# Patient Record
Sex: Female | Born: 1975
Health system: Southern US, Community
[De-identification: ages and names within clinical notes are randomized; demographics above are authoritative.]

## PROBLEM LIST (undated history)

## (undated) DIAGNOSIS — Z86718 Personal history of other venous thrombosis and embolism: Secondary | ICD-10-CM

## (undated) DIAGNOSIS — D509 Iron deficiency anemia, unspecified: Secondary | ICD-10-CM

## (undated) DIAGNOSIS — D219 Benign neoplasm of connective and other soft tissue, unspecified: Secondary | ICD-10-CM

## (undated) DIAGNOSIS — D649 Anemia, unspecified: Secondary | ICD-10-CM

## (undated) DIAGNOSIS — I82401 Acute embolism and thrombosis of unspecified deep veins of right lower extremity: Secondary | ICD-10-CM

## (undated) DIAGNOSIS — F32A Depression, unspecified: Secondary | ICD-10-CM

## (undated) DIAGNOSIS — F419 Anxiety disorder, unspecified: Secondary | ICD-10-CM

## (undated) DIAGNOSIS — I10 Essential (primary) hypertension: Secondary | ICD-10-CM

## (undated) DIAGNOSIS — F329 Major depressive disorder, single episode, unspecified: Secondary | ICD-10-CM

## (undated) HISTORY — DX: Personal history of other venous thrombosis and embolism: Z86.718

## (undated) HISTORY — PX: LAPAROSCOPIC TOTAL HYSTERECTOMY: SUR800

## (undated) HISTORY — DX: Anxiety disorder, unspecified: F41.9

## (undated) HISTORY — DX: Benign neoplasm of connective and other soft tissue, unspecified: D21.9

## (undated) HISTORY — DX: Essential (primary) hypertension: I10

## (undated) HISTORY — PX: GASTRIC BYPASS: SHX52

## (undated) HISTORY — DX: Anemia, unspecified: D64.9

## (undated) HISTORY — DX: Depression, unspecified: F32.A

## (undated) HISTORY — DX: Acute embolism and thrombosis of unspecified deep veins of right lower extremity: I82.401

## (undated) HISTORY — DX: Major depressive disorder, single episode, unspecified: F32.9

## (undated) HISTORY — DX: Iron deficiency anemia, unspecified: D50.9

---

## 2016-11-13 ENCOUNTER — Encounter: Payer: Self-pay | Admitting: Internal Medicine

## 2017-09-17 ENCOUNTER — Encounter: Payer: Self-pay | Admitting: Internal Medicine

## 2017-10-11 LAB — HM PAP SMEAR: HM PAP: NORMAL

## 2018-01-21 ENCOUNTER — Encounter: Payer: Self-pay | Admitting: Internal Medicine

## 2018-12-20 ENCOUNTER — Ambulatory Visit: Payer: BLUE CROSS/BLUE SHIELD | Admitting: Internal Medicine

## 2018-12-20 ENCOUNTER — Encounter: Payer: Self-pay | Admitting: Internal Medicine

## 2018-12-20 VITALS — BP 128/86 | HR 73 | Temp 98.6°F | Ht 70.0 in | Wt 368.8 lb

## 2018-12-20 DIAGNOSIS — Z9884 Bariatric surgery status: Secondary | ICD-10-CM | POA: Insufficient documentation

## 2018-12-20 DIAGNOSIS — F5104 Psychophysiologic insomnia: Secondary | ICD-10-CM | POA: Insufficient documentation

## 2018-12-20 DIAGNOSIS — F419 Anxiety disorder, unspecified: Secondary | ICD-10-CM | POA: Insufficient documentation

## 2018-12-20 DIAGNOSIS — Z1159 Encounter for screening for other viral diseases: Secondary | ICD-10-CM

## 2018-12-20 DIAGNOSIS — Z0184 Encounter for antibody response examination: Secondary | ICD-10-CM

## 2018-12-20 DIAGNOSIS — E538 Deficiency of other specified B group vitamins: Secondary | ICD-10-CM

## 2018-12-20 DIAGNOSIS — D509 Iron deficiency anemia, unspecified: Secondary | ICD-10-CM

## 2018-12-20 DIAGNOSIS — Z8679 Personal history of other diseases of the circulatory system: Secondary | ICD-10-CM | POA: Diagnosis not present

## 2018-12-20 DIAGNOSIS — Z6841 Body Mass Index (BMI) 40.0 and over, adult: Secondary | ICD-10-CM | POA: Insufficient documentation

## 2018-12-20 DIAGNOSIS — E1159 Type 2 diabetes mellitus with other circulatory complications: Secondary | ICD-10-CM | POA: Insufficient documentation

## 2018-12-20 DIAGNOSIS — G4700 Insomnia, unspecified: Secondary | ICD-10-CM

## 2018-12-20 DIAGNOSIS — R739 Hyperglycemia, unspecified: Secondary | ICD-10-CM

## 2018-12-20 DIAGNOSIS — I10 Essential (primary) hypertension: Secondary | ICD-10-CM | POA: Insufficient documentation

## 2018-12-20 DIAGNOSIS — Z1329 Encounter for screening for other suspected endocrine disorder: Secondary | ICD-10-CM

## 2018-12-20 DIAGNOSIS — Z1389 Encounter for screening for other disorder: Secondary | ICD-10-CM

## 2018-12-20 DIAGNOSIS — Z13220 Encounter for screening for lipoid disorders: Secondary | ICD-10-CM

## 2018-12-20 DIAGNOSIS — E559 Vitamin D deficiency, unspecified: Secondary | ICD-10-CM

## 2018-12-20 DIAGNOSIS — Z86718 Personal history of other venous thrombosis and embolism: Secondary | ICD-10-CM

## 2018-12-20 DIAGNOSIS — F329 Major depressive disorder, single episode, unspecified: Secondary | ICD-10-CM

## 2018-12-20 MED ORDER — SERTRALINE HCL 25 MG PO TABS: 25.0000 mg | ORAL_TABLET | Freq: Every day | ORAL | 0 refills | Status: DC

## 2018-12-20 NOTE — Patient Instructions (Addendum)
You can try Melatonin or L thenanine at night for sleep  Or warm milk or chamomile tea   Iron Deficiency Anemia, Adult Iron deficiency anemia is a condition in which the concentration of red blood cells or hemoglobin in the blood is below normal because of too little iron. Hemoglobin is a substance in red blood cells that carries oxygen to the body's tissues. When the concentration of red blood cells or hemoglobin is too low, not enough oxygen reaches these tissues. Iron deficiency anemia is usually long-lasting (chronic) and it develops over time. It may or may not cause symptoms. It is a common type of anemia. What are the causes? This condition may be caused by:  Not enough iron in the diet.  Blood loss caused by bleeding in the intestine.  Blood loss from a gastrointestinal condition like Crohn disease.  Frequent blood draws, such as from blood donation.  Abnormal absorption in the gut.  Heavy menstrual periods in women.  Cancers of the gastrointestinal system, such as colon cancer. What are the signs or symptoms? Symptoms of this condition may include:  Fatigue.  Headache.  Pale skin, lips, and nail beds.  Poor appetite.  Weakness.  Shortness of breath.  Dizziness.  Cold hands and feet.  Fast or irregular heartbeat.  Irritability. This is more common in severe anemia.  Rapid breathing. This is more common in severe anemia. Mild anemia may not cause any symptoms. How is this diagnosed? This condition is diagnosed based on:  Your medical history.  A physical exam.  Blood tests. You may have additional tests to find the underlying cause of your anemia, such as:  Testing for blood in the stool (fecal occult blood test).  A procedure to see inside your colon and rectum (colonoscopy).  A procedure to see inside your esophagus and stomach (endoscopy).  A test in which cells are removed from bone marrow (bone marrow aspiration) or fluid is removed from the  bone marrow to be examined (biopsy). This is rarely needed. How is this treated? This condition is treated by correcting the cause of your iron deficiency. Treatment may involve:  Adding iron-rich foods to your diet.  Taking iron supplements. If you are pregnant or breastfeeding, you may need to take extra iron because your normal diet usually does not provide the amount of iron that you need.  Increasing vitamin C intake. Vitamin C helps your body absorb iron. Your health care provider may recommend that you take iron supplements along with a glass of orange juice or a vitamin C supplement.  Medicines to make heavy menstrual flow lighter.  Surgery. You may need repeat blood tests to determine whether treatment is working. Depending on the underlying cause, the anemia should be corrected within 2 months of starting treatment. If the treatment does not seem to be working, you may need more testing. Follow these instructions at home: Medicines  Take over-the-counter and prescription medicines only as told by your health care provider. This includes iron supplements and vitamins.  If you cannot tolerate taking iron supplements by mouth, talk with your health care provider about taking them through a vein (intravenously) or an injection into a muscle.  For the best iron absorption, you should take iron supplements when your stomach is empty. If you cannot tolerate them on an empty stomach, you may need to take them with food.  Do not drink milk or take antacids at the same time as your iron supplements. Milk and antacids may  interfere with iron absorption.  Iron supplements can cause constipation. To prevent constipation, include fiber in your diet as told by your health care provider. A stool softener may also be recommended. Eating and drinking   Talk with your health care provider before changing your diet. He or she may recommend that you eat foods that contain a lot of iron, such  as: ? Liver. ? Low-fat (lean) beef. ? Breads and cereals that have iron added to them (are fortified). ? Eggs. ? Dried fruit. ? Dark green, leafy vegetables.  To help your body use the iron from iron-rich foods, eat those foods at the same time as fresh fruits and vegetables that are high in vitamin C. Foods that are high in vitamin C include: ? Oranges. ? Peppers. ? Tomatoes. ? Mangoes.  Drinkenoughfluid to keep your urine clear or pale yellow. General instructions  Return to your normal activities as told by your health care provider. Ask your health care provider what activities are safe for you.  Practice good hygiene. Anemia can make you more prone to illness and infection.  Keep all follow-up visits as told by your health care provider. This is important. Contact a health care provider if:  You feel nauseous or you vomit.  You feel weak.  You have unexplained sweating.  You develop symptoms of constipation, such as: ? Having fewer than three bowel movements a week. ? Straining to have a bowel movement. ? Having stools that are hard, dry, or larger than normal. ? Feeling full or bloated. ? Pain in the lower abdomen. ? Not feeling relief after having a bowel movement. Get help right away if:  You faint. If this happens, do not drive yourself to the hospital. Call your local emergency services (911 in the [place]  You have chest pain.  You have shortness of breath that: ? Is severe. ? Gets worse with physical activity.  You have a rapid heartbeat.  You become light-headed when getting up from a sitting or lying down position. This information is not intended to replace advice given to you by your health care provider. Make sure you discuss any questions you have with your health care provider. Document Released: 10/30/2000 Document Revised: 07/22/2016 Document Reviewed: 07/22/2016 Elsevier Interactive Patient Education  2019 [employer].  Exercising to [employer] Exercise is structured, repetitive physical activity to improve fitness and health. Getting regular exercise is important for everyone. It is especially important if you are overweight. Being overweight increases your risk of heart disease, stroke, diabetes, high blood pressure, and several types of cancer. Reducing your calorie intake and exercising can help you lose weight. Exercise is usually categorized as moderate or vigorous intensity. To lose weight, most people need to do a certain amount of moderate-intensity or vigorous-intensity exercise each week. Moderate-intensity exercise  Moderate-intensity exercise is any activity that gets you moving enough to burn at least three times more energy (calories) than if you were sitting. Examples of moderate exercise include:  Walking a mile in 15 minutes.  Doing light yard work.  Biking at an easy pace. Most people should get at least 150 minutes (2 hours and 30 minutes) a week of moderate-intensity exercise to maintain their body weight. Vigorous-intensity exercise Vigorous-intensity exercise is any activity that gets you moving enough to burn at least six times more calories than if you were sitting. When you exercise at this intensity, you should be working hard enough that you are not able to carry on  a conversation. Examples of vigorous exercise include:  Running.  Playing a team sport, such as football, basketball, and soccer.  Jumping rope. Most people should get at least 75 minutes (1 hour and 15 minutes) a week of vigorous-intensity exercise to maintain their body weight. How can exercise affect me? When you exercise enough to burn more calories than you eat, you lose weight. Exercise also reduces body fat and builds muscle. The more muscle you have, the more calories you burn. Exercise also:  Improves mood.  Reduces stress and tension.  Improves your overall fitness, flexibility, and endurance.  Increases bone  strength. The amount of exercise you need to lose weight depends on:  Your age.  The type of exercise.  Any health conditions you have.  Your overall physical ability. Talk to your health care provider about how much exercise you need and what types of activities are safe for you. What actions can I take to lose weight? Nutrition   Make changes to your diet as told by your health care provider or diet and nutrition specialist (dietitian). This may include: ? Eating fewer calories. ? Eating more protein. ? Eating less unhealthy fats. ? Eating a diet that includes fresh fruits and vegetables, whole grains, low-fat dairy products, and lean protein. ? Avoiding foods with added fat, salt, and sugar.  Drink plenty of water while you exercise to prevent dehydration or heat stroke. Activity  Choose an activity that you enjoy and set realistic goals. Your health care provider can help you make an exercise plan that works for you.  Exercise at a moderate or vigorous intensity most days of the week. ? The intensity of exercise may vary from person to person. You can tell how intense a workout is for you by paying attention to your breathing and heartbeat. Most people will notice their breathing and heartbeat get faster with more intense exercise.  Do resistance training twice each week, such as: ? Push-ups. ? Sit-ups. ? Lifting weights. ? Using resistance bands.  Getting short amounts of exercise can be just as helpful as long structured periods of exercise. If you have trouble finding time to exercise, try to include exercise in your daily routine. ? Get up, stretch, and walk around every 30 minutes throughout the day. ? Go for a walk during your lunch break. ? Park your car farther away from your destination. ? If you take public transportation, get off one stop early and walk the rest of the way. ? Make phone calls while standing up and walking around. ? Take the stairs instead of  elevators or escalators.  Wear comfortable clothes and shoes with good support.  Do not exercise so much that you hurt yourself, feel dizzy, or get very short of breath. Where to find more information  [place] Department of Health and Human Services: [employer]at  Centers for Disease Control and Prevention (CDC): http://www.wolf.info/ Contact a health care provider:  Before starting a new exercise program.  If you have questions or concerns about your weight.  If you have a medical problem that keeps you from exercising. Get help right away if you have any of the following while exercising:  Injury.  Dizziness.  Difficulty breathing or shortness of breath that does not go away when you stop exercising.  Chest pain.  Rapid heartbeat. Summary  Being overweight increases your risk of heart disease, stroke, diabetes, high blood pressure, and several types of cancer.  Losing weight happens when you burn more calories  than you eat.  Reducing the amount of calories you eat in addition to getting regular moderate or vigorous exercise each week helps you lose weight. This information is not intended to replace advice given to you by your health care provider. Make sure you discuss any questions you have with your health care provider. Document Released: 12/05/2010 Document Revised: 11/15/2017 Document Reviewed: 11/15/2017 Elsevier Interactive Patient Education  2019 [employer].

## 2018-12-20 NOTE — Progress Notes (Addendum)
Chief Complaint  Patient presents with  . Establish Care   New Patient  1. H/o HTN BP controlled off meds  2. Iron def anemia s/p gastric bypass in 2007 lost from 500 lbs to 368 lbs  3. Anxiety/depression/insomnia sleeping 3-4 hrs GAD 7 score 12 and PHQ 9 score 11. Declines therapy for now  4. H/o right leg dvt no active sx's tx'ed eliquis x 3 months but stopped caused n/v  5. Morbid obesity s/p gastric bypass was 500 + lbs prior to surgery. She wants to get on adipex again   Review of Systems  Constitutional: Negative for weight loss.  HENT: Negative for hearing loss.   Eyes: Negative for blurred vision.  Respiratory: Negative for shortness of breath.   Cardiovascular: Negative for chest pain.  Gastrointestinal: Negative for abdominal pain.  Musculoskeletal: Negative for falls.  Skin: Negative for rash.  Neurological: Negative for headaches.  Psychiatric/Behavioral: Positive for depression. The patient is nervous/anxious and has insomnia.    Past Medical History:  Diagnosis Date  . Anemia   . Anxiety   . Depression   . Hypertension   . Iron deficiency anemia   . Right leg DVT [facility])    Past Surgical History:  Procedure Laterality Date  . GASTRIC BYPASS     2007   Family History  Problem Relation Age of Onset  . Hypertension Mother   . Hyperlipidemia Father   . Diabetes Maternal Grandmother    Social History   Socioeconomic History  . Marital status: Married    Spouse name: Not on file  . Number of children: Not on file  . Years of education: Not on file  . Highest education level: Not on file  Occupational History  . Not on file  Social Needs  . Financial resource strain: Not on file  . Food insecurity:    Worry: Not on file    Inability: Not on file  . Transportation needs:    Medical: Not on file    Non-medical: Not on file  Tobacco Use  . Smoking status: Never Smoker  . Smokeless tobacco: Never Used  Substance and Sexual Activity  . Alcohol use:  Not Currently  . Drug use: Not Currently  . Sexual activity: Yes    Comment: men  Lifestyle  . Physical activity:    Days per week: Not on file    Minutes per session: Not on file  . Stress: Not on file  Relationships  . Social connections:    Talks on phone: Not on file    Gets together: Not on file    Attends religious service: Not on file    Active member of club or organization: Not on file    Attends meetings of clubs or organizations: Not on file    Relationship status: Not on file  . Intimate partner violence:    Fear of current or ex partner: Not on file    Emotionally abused: Not on file    Physically abused: Not on file    Forced sexual activity: Not on file  Other Topics Concern  . Not on file  Social History Narrative   Married Cytogeneticist    2 kids 34 and 39 y.o as of 12/20/2018    Moved from [place] to [place] to [place]    Owns guns, wears seat belt, safe in relationship    3 pregnancies 2 live births    No outpatient medications have been marked as taking for  the 12/20/18 encounter (Office Visit) with McLean-Scocuzza, Nino Glow, MD.   No Known Allergies No results found for this or any previous visit (from the past 2160 hour(s)). Objective  Body mass index is 52.92 kg/m. Wt Readings from Last 3 Encounters:  12/20/18 (!) 368 lb 12.8 oz (167.3 kg)   Temp Readings from Last 3 Encounters:  12/20/18 98.6 F (37 C) (Oral)   BP Readings from Last 3 Encounters:  12/20/18 128/86   Pulse Readings from Last 3 Encounters:  12/20/18 73    Physical Exam Vitals signs and nursing note reviewed.  Constitutional:      Appearance: Normal appearance. She is well-developed and well-groomed. She is morbidly obese.  HENT:     Head: Normocephalic and atraumatic.     Nose: Nose normal.     Mouth/Throat:     Mouth: Mucous membranes are moist.     Pharynx: Oropharynx is clear.  Eyes:     Conjunctiva/sclera: Conjunctivae normal.     Pupils: Pupils are equal, round, and reactive to  light.  Cardiovascular:     Rate and Rhythm: Normal rate and regular rhythm.     Pulses: Normal pulses.     Heart sounds: Normal heart sounds.  Pulmonary:     Effort: Pulmonary effort is normal.     Breath sounds: Normal breath sounds.  Skin:    General: Skin is warm and dry.  Neurological:     General: No focal deficit present.     Mental Status: She is alert and oriented to person, place, and time. Mental status is at baseline.     Gait: Gait normal.  Psychiatric:        Attention and Perception: Attention and perception normal.        Mood and Affect: Mood and affect normal.        Speech: Speech normal.        Behavior: Behavior normal. Behavior is cooperative.        Thought Content: Thought content normal.        Cognition and Memory: Cognition and memory normal.        Judgment: Judgment normal.     Assessment   1. H/o HTN BP controlled 2. Iron def anemia s/p gastric bypass in 2007  3. Anxiety/depression/insomnia sleeping 3-4 hrs GAD 7 score 12 and PHQ 9 score 11  4. H/o right leg dvt no active sx's 5. Morbid obesity s/p gastric bypass was 500 + lbs prior to surgery  6. HM Plan   1.  Monitor BP  2.  Check labs may need h/o referral  3.  zoloft 25 mg qd  Consider melatonin or l theanine  Declines therapy for now  4.  tx'ed eliquis x 3 months year 2019 could not tolerate due to n/v  5.  Consider adipex pending labs if not significantly anemic  6.  Declines flu shot of not she had flu 2 weeks ago entire family. Last flu shot in 2018  Tdap thinks had in 2018  LMP 12/09/2018, Pap 2018 OB/GYN in [place] get records  Pap negative 10/11/17 neg pap neg HPV  Former PCP Dr. Kathe Mariner [facility]  Get records [facility] and [place] obained -obtained records [place] seen 01/17/18 DVT right lower ext 11/13/2016 s/p Eliquis x 2 months and iron def anemia hbg 6.4, 7.6  -h/o low Hbg A2 1.6 range 1.8-3.2 may indicate alpha thal or iron  def anemia  H/o  elevated lfts AST 42 range was 14-36  Provider: Dr. Olivia Mackie McLean-Scocuzza-Internal Medicine

## 2018-12-20 NOTE — Progress Notes (Signed)
Pre visit review using our clinic review tool, if applicable. No additional management support is needed unless otherwise documented below in the visit note. 

## 2018-12-23 ENCOUNTER — Other Ambulatory Visit (INDEPENDENT_AMBULATORY_CARE_PROVIDER_SITE_OTHER): Payer: BLUE CROSS/BLUE SHIELD

## 2018-12-23 ENCOUNTER — Encounter: Payer: Self-pay | Admitting: Internal Medicine

## 2018-12-23 ENCOUNTER — Telehealth: Payer: Self-pay | Admitting: Radiology

## 2018-12-23 ENCOUNTER — Other Ambulatory Visit: Payer: Self-pay | Admitting: Internal Medicine

## 2018-12-23 DIAGNOSIS — E559 Vitamin D deficiency, unspecified: Secondary | ICD-10-CM

## 2018-12-23 DIAGNOSIS — Z0184 Encounter for antibody response examination: Secondary | ICD-10-CM

## 2018-12-23 DIAGNOSIS — Z1159 Encounter for screening for other viral diseases: Secondary | ICD-10-CM | POA: Diagnosis not present

## 2018-12-23 DIAGNOSIS — E538 Deficiency of other specified B group vitamins: Secondary | ICD-10-CM | POA: Diagnosis not present

## 2018-12-23 DIAGNOSIS — Z13220 Encounter for screening for lipoid disorders: Secondary | ICD-10-CM | POA: Diagnosis not present

## 2018-12-23 DIAGNOSIS — Z1389 Encounter for screening for other disorder: Secondary | ICD-10-CM | POA: Diagnosis not present

## 2018-12-23 DIAGNOSIS — F419 Anxiety disorder, unspecified: Secondary | ICD-10-CM | POA: Diagnosis not present

## 2018-12-23 DIAGNOSIS — R739 Hyperglycemia, unspecified: Secondary | ICD-10-CM | POA: Diagnosis not present

## 2018-12-23 DIAGNOSIS — F329 Major depressive disorder, single episode, unspecified: Secondary | ICD-10-CM | POA: Diagnosis not present

## 2018-12-23 DIAGNOSIS — Z1329 Encounter for screening for other suspected endocrine disorder: Secondary | ICD-10-CM

## 2018-12-23 DIAGNOSIS — D509 Iron deficiency anemia, unspecified: Secondary | ICD-10-CM

## 2018-12-23 DIAGNOSIS — Z8679 Personal history of other diseases of the circulatory system: Secondary | ICD-10-CM

## 2018-12-23 LAB — LIPID PANEL
CHOLESTEROL: 169 mg/dL (ref 0–200)
HDL: 45 mg/dL (ref >39.00)
LDL CALC: 105 mg/dL — AB (ref 0–99)
NonHDL: 124.26
Total CHOL/HDL Ratio: 4
Triglycerides: 98 mg/dL (ref 0.0–149.0)
VLDL: 19.6 mg/dL (ref 0.0–40.0)

## 2018-12-23 LAB — COMPREHENSIVE METABOLIC PANEL
ALT: 8 U/L (ref 0–35)
AST: 19 U/L (ref 0–37)
Albumin: 3.9 g/dL (ref 3.5–5.2)
Alkaline Phosphatase: 135 U/L — ABNORMAL HIGH (ref 39–117)
BUN: 9 mg/dL (ref 6–23)
CO2: 26 mEq/L (ref 19–32)
CREATININE: 0.77 mg/dL (ref 0.40–1.20)
Calcium: 7.8 mg/dL — ABNORMAL LOW (ref 8.4–10.5)
Chloride: 103 mEq/L (ref 96–112)
GFR: 82.67 mL/min (ref >60.00)
Glucose, Bld: 75 mg/dL (ref 70–99)
Potassium: 3.7 mEq/L (ref 3.5–5.1)
Sodium: 137 mEq/L (ref 135–145)
Total Bilirubin: 0.4 mg/dL (ref 0.2–1.2)
Total Protein: 7.3 g/dL (ref 6.0–8.3)

## 2018-12-23 LAB — CBC WITH DIFFERENTIAL/PLATELET
Basophils Absolute: 0 10*3/uL (ref 0.0–0.1)
Basophils Relative: 0.2 % (ref 0.0–3.0)
Eosinophils Absolute: 0 10*3/uL (ref 0.0–0.7)
Eosinophils Relative: 0.6 % (ref 0.0–5.0)
HCT: 26.8 % — ABNORMAL LOW (ref 36.0–46.0)
Hemoglobin: 8 g/dL — CL (ref 12.0–15.0)
Lymphocytes Relative: 33.8 % (ref 12.0–46.0)
Lymphs Abs: 2.3 10*3/uL (ref 0.7–4.0)
MCHC: 29.8 g/dL — ABNORMAL LOW (ref 30.0–36.0)
MCV: 63.1 fl — ABNORMAL LOW (ref 78.0–100.0)
Monocytes Absolute: 0.5 10*3/uL (ref 0.1–1.0)
Monocytes Relative: 7.3 % (ref 3.0–12.0)
Neutro Abs: 3.9 10*3/uL (ref 1.4–7.7)
Neutrophils Relative %: 58.1 % (ref 43.0–77.0)
Platelets: 442 10*3/uL — ABNORMAL HIGH (ref 150.0–400.0)
RBC: 4.25 Mil/uL (ref 3.87–5.11)
RDW: 21.1 % — ABNORMAL HIGH (ref 11.5–15.5)
WBC: 6.8 10*3/uL (ref 4.0–10.5)

## 2018-12-23 LAB — URINALYSIS, ROUTINE W REFLEX MICROSCOPIC
BILIRUBIN URINE: NEGATIVE
Glucose, UA: NEGATIVE
Hgb urine dipstick: NEGATIVE
Hyaline Cast: NONE SEEN /LPF
Ketones, ur: NEGATIVE
NITRITE: NEGATIVE
Protein, ur: NEGATIVE
RBC / HPF: NONE SEEN /HPF (ref 0–2)
Specific Gravity, Urine: 1.009 (ref 1.001–1.03)
pH: 7 (ref 5.0–8.0)

## 2018-12-23 LAB — VITAMIN D 25 HYDROXY (VIT D DEFICIENCY, FRACTURES): VITD: 10.71 ng/mL — ABNORMAL LOW (ref 30.00–100.00)

## 2018-12-23 LAB — VITAMIN B12: VITAMIN B 12: 208 pg/mL — AB (ref 211–911)

## 2018-12-23 LAB — TSH: TSH: 2.75 u[IU]/mL (ref 0.35–4.50)

## 2018-12-23 LAB — T4, FREE: Free T4: 0.74 ng/dL (ref 0.60–1.60)

## 2018-12-23 LAB — HEMOGLOBIN A1C: Hgb A1c MFr Bld: 5.5 % (ref 4.6–6.5)

## 2018-12-23 MED ORDER — CHOLECALCIFEROL 1.25 MG (50000 UT) PO CAPS: 50000.0000 [IU] | ORAL_CAPSULE | ORAL | 1 refills | Status: DC

## 2018-12-23 NOTE — Telephone Encounter (Signed)
[facility] called with critical hemoglobin of 8.0. Read back and verified.

## 2018-12-26 ENCOUNTER — Other Ambulatory Visit: Payer: Self-pay | Admitting: Internal Medicine

## 2018-12-26 DIAGNOSIS — Z86718 Personal history of other venous thrombosis and embolism: Secondary | ICD-10-CM

## 2018-12-26 DIAGNOSIS — D509 Iron deficiency anemia, unspecified: Secondary | ICD-10-CM

## 2018-12-26 LAB — MEASLES/MUMPS/RUBELLA IMMUNITY
Mumps IgG: 16.6 AU/mL
Rubella: 4.28 index
Rubeola IgG: 219 AU/mL

## 2018-12-26 LAB — IRON,TIBC AND FERRITIN PANEL
%SAT: 5 % (calc) — ABNORMAL LOW (ref 16–45)
Ferritin: 6 ng/mL — ABNORMAL LOW (ref 16–154)
Iron: 21 ug/dL — ABNORMAL LOW (ref 40–190)
TIBC: 412 mcg/dL (calc) (ref 250–450)

## 2018-12-26 LAB — HEPATITIS B SURFACE ANTIBODY, QUANTITATIVE: Hep B S AB Quant (Post): <5 m[IU]/mL — ABNORMAL LOW (ref >=10)

## 2018-12-30 ENCOUNTER — Inpatient Hospital Stay: Payer: BLUE CROSS/BLUE SHIELD | Attending: Internal Medicine | Admitting: Internal Medicine

## 2018-12-30 ENCOUNTER — Telehealth: Payer: Self-pay | Admitting: Internal Medicine

## 2018-12-30 ENCOUNTER — Encounter: Payer: Self-pay | Admitting: Internal Medicine

## 2018-12-30 DIAGNOSIS — E538 Deficiency of other specified B group vitamins: Secondary | ICD-10-CM | POA: Diagnosis not present

## 2018-12-30 DIAGNOSIS — E559 Vitamin D deficiency, unspecified: Secondary | ICD-10-CM | POA: Diagnosis not present

## 2018-12-30 DIAGNOSIS — G2581 Restless legs syndrome: Secondary | ICD-10-CM | POA: Diagnosis not present

## 2018-12-30 DIAGNOSIS — Z86718 Personal history of other venous thrombosis and embolism: Secondary | ICD-10-CM | POA: Diagnosis not present

## 2018-12-30 DIAGNOSIS — D508 Other iron deficiency anemias: Secondary | ICD-10-CM | POA: Diagnosis not present

## 2018-12-30 DIAGNOSIS — G4700 Insomnia, unspecified: Secondary | ICD-10-CM | POA: Insufficient documentation

## 2018-12-30 DIAGNOSIS — E669 Obesity, unspecified: Secondary | ICD-10-CM | POA: Insufficient documentation

## 2018-12-30 DIAGNOSIS — D509 Iron deficiency anemia, unspecified: Secondary | ICD-10-CM

## 2018-12-30 DIAGNOSIS — I10 Essential (primary) hypertension: Secondary | ICD-10-CM | POA: Insufficient documentation

## 2018-12-30 DIAGNOSIS — R5383 Other fatigue: Secondary | ICD-10-CM | POA: Diagnosis not present

## 2018-12-30 DIAGNOSIS — Z79899 Other long term (current) drug therapy: Secondary | ICD-10-CM | POA: Diagnosis not present

## 2018-12-30 DIAGNOSIS — K909 Intestinal malabsorption, unspecified: Secondary | ICD-10-CM | POA: Diagnosis not present

## 2018-12-30 DIAGNOSIS — Z9884 Bariatric surgery status: Secondary | ICD-10-CM | POA: Insufficient documentation

## 2018-12-30 NOTE — Progress Notes (Signed)
[place] NOTE  Patient Care Team: McLean-Scocuzza, Nino Glow, MD as PCP - General (Internal Medicine)  CHIEF COMPLAINTS/PURPOSE OF CONSULTATION:    HEMATOLOGY HISTORY  # IRON DEF ANEMIA- sec to gastric bypass [pcp- ferritin6/Hb-8 MCV 68; B12 -low; vit D-low]  # gastric bypass [2007; [place]]   HISTORY OF PRESENTING ILLNESS:  Alyssa Cain 43 y.o.  female has been referred to [name] for further evaluation/work-up for anemia.  Patient states that she had a gastric bypass in 2007 for obesity.  However more recently in [place] noted to have worsening anemia.  Patient was told to get IV iron infusions however she had more recently up to [facility].  She has not had any IV iron infusions.  She was recently evaluated by PCP/establish care and noted to have severe iron deficiency and B12 deficiency vitamin D deficiency.  No blood in stools black or stools.  No heavy vaginal bleeding.  No abnormal weight loss or difficulty swallowing.  Complains of significant difficulty sleeping and restless legs.  No pica.  Fatigue on exertion.   Review of Systems  Constitutional: Positive for malaise/fatigue. Negative for chills, diaphoresis, fever and weight loss.  HENT: Negative for nosebleeds and sore throat.   Eyes: Negative for double vision.  Respiratory: Negative for cough, hemoptysis, sputum production, shortness of breath and wheezing.   Cardiovascular: Negative for chest pain, palpitations, orthopnea and leg swelling.  Gastrointestinal: Negative for abdominal pain, blood in stool, constipation, diarrhea, heartburn, melena, nausea and vomiting.  Genitourinary: Negative for dysuria, frequency and urgency.  Musculoskeletal: Positive for myalgias. Negative for back pain and joint pain.  Skin: Negative.  Negative for itching and rash.  Neurological: Positive for tingling. Negative for dizziness, focal weakness, weakness and headaches.  Endo/Heme/Allergies: Does  not bruise/bleed easily.  Psychiatric/Behavioral: Negative for depression. The patient has insomnia. The patient is not nervous/anxious.     MEDICAL HISTORY:  Past Medical History:  Diagnosis Date  . Anemia   . Anxiety   . Depression   . Hypertension   . Iron deficiency anemia   . Right leg DVT ([place])     SURGICAL HISTORY: Past Surgical History:  Procedure Laterality Date  . GASTRIC BYPASS     2007    SOCIAL HISTORY: Social History   Socioeconomic History  . Marital status: Married    Spouse name: Not on file  . Number of children: Not on file  . Years of education: Not on file  . Highest education level: Not on file  Occupational History  . Not on file  Social Needs  . Financial resource strain: Not on file  . Food insecurity:    Worry: Not on file    Inability: Not on file  . Transportation needs:    Medical: Not on file    Non-medical: Not on file  Tobacco Use  . Smoking status: Never Smoker  . Smokeless tobacco: Never Used  Substance and Sexual Activity  . Alcohol use: Not Currently  . Drug use: Not Currently  . Sexual activity: Yes    Comment: men  Lifestyle  . Physical activity:    Days per week: Not on file    Minutes per session: Not on file  . Stress: Not on file  Relationships  . Social connections:    Talks on phone: Not on file    Gets together: Not on file    Attends religious service: Not on file    Active member of club  or organization: Not on file    Attends meetings of clubs or organizations: Not on file    Relationship status: Not on file  . Intimate partner violence:    Fear of current or ex partner: Not on file    Emotionally abused: Not on file    Physically abused: Not on file    Forced sexual activity: Not on file  Other Topics Concern  . Not on file  Social History Narrative   Married Cytogeneticist    2 kids 40 and 31 y.o as of 12/20/2018    Moved from [place] to [place] to [place]    Owns guns, wears seat belt, safe in relationship    3  pregnancies 2 live births       Work at [employer] in [employer].     FAMILY HISTORY: Family History  Problem Relation Age of Onset  . Hypertension Mother   . Hyperlipidemia Father   . Diabetes Maternal Grandmother     ALLERGIES:  has No Known Allergies.  MEDICATIONS:  Current Outpatient Medications  Medication Sig Dispense Refill  . Cholecalciferol 1.25 MG (50000 UT) capsule Take 1 capsule (50,000 Units total) by mouth once a week. 13 capsule 1  . sertraline (ZOLOFT) 25 MG tablet Take 1 tablet (25 mg total) by mouth daily. In am 90 tablet 0   No current facility-administered medications for this visit.       PHYSICAL EXAMINATION:   Vitals:   12/30/18 1447  BP: (!) 156/97  Pulse: 98  Resp: 16  Temp: 97.6 F (36.4 C)   Filed Weights   12/30/18 1447  Weight: (!) 357 lb (161.9 kg)    Physical Exam  Constitutional: She is oriented to person, place, and time and well-developed, well-nourished, and in no distress.  HENT:  Head: Normocephalic and atraumatic.  Mouth/Throat: Oropharynx is clear and moist. No oropharyngeal exudate.  Eyes: Pupils are equal, round, and reactive to light.  Neck: Normal range of motion. Neck supple.  Cardiovascular: Normal rate and regular rhythm.  Pulmonary/Chest: No respiratory distress. She has no wheezes.  Abdominal: Soft. Bowel sounds are normal. She exhibits no distension and no mass. There is no abdominal tenderness. There is no rebound and no guarding.  Musculoskeletal: Normal range of motion.        General: No tenderness or edema.  Neurological: She is alert and oriented to person, place, and time.  Skin: Skin is warm.  Psychiatric: Affect normal.    LABORATORY DATA:  I have reviewed the data as listed Lab Results  Component Value Date   WBC 6.8 12/23/2018   HGB 8.0 Repeated and verified X2. (LL) 12/23/2018   HCT 26.8 Repeated and verified X2. (L) 12/23/2018   MCV 63.1 Repeated and verified X2. (L) 12/23/2018   PLT 442.0  (H) 12/23/2018   Recent Labs    12/23/18 1028  NA 137  K 3.7  CL 103  CO2 26  GLUCOSE 75  BUN 9  CREATININE 0.77  CALCIUM 7.8*  PROT 7.3  ALBUMIN 3.9  AST 19  ALT 8  ALKPHOS 135*  BILITOT 0.4     No results found.  Malabsorption of iron # Iron def anemia sec to iron malabsorption-/gastric bypass.  Recommend IV iron.   #Long discussion the patient regarding pathophysiology of malabsorption in patient gastric bypass.  Patients sometimes need lifelong maintenance therapies.  #Discussed the possible infusion reactions with IV iron.  Discussed that these are extremely rare but quite possible.   #  B12 B12 deficiency gastric bypass-recommend B12 injections initially weekly then monthly.  Also discussed regarding sublingual B12.  #Insomnia/restless legs likely secondary to iron deficiency.-See above.  #Weight gain-defer to PCP.   #Vitamin D deficiency-gastric malabsorption on high-dose vitamin D per PCP.  Thank you Dr.Mc-Clean for allowing me to participate in the care of your pleasant patient. Please do not hesitate to contact me with questions or concerns in the interim.   # 45 minutes face-to-face with the patient discussing the above plan of care; more than 50% of time spent on prognosis/ natural history; counseling and coordination.  # DISPOSITION: # IV Ferrahem/ B12 IM weekly x4- start in 1 week # Follow up in 5 weeks/labs-cbc/ldh/Ferrahem/b12-Dr.B    All questions were answered. The patient knows to call the clinic with any problems, questions or concerns.      Cammie Sickle, MD 01/02/2019 12:54 PM

## 2018-12-30 NOTE — Telephone Encounter (Signed)
x

## 2018-12-30 NOTE — Assessment & Plan Note (Addendum)
#   Iron def anemia sec to iron malabsorption-/gastric bypass.  Recommend IV iron.   #Long discussion the patient regarding pathophysiology of malabsorption in patient gastric bypass.  Patients sometimes need lifelong maintenance therapies.  #Discussed the possible infusion reactions with IV iron.  Discussed that these are extremely rare but quite possible.   # B12 B12 deficiency gastric bypass-recommend B12 injections initially weekly then monthly.  Also discussed regarding sublingual B12.  #Insomnia/restless legs likely secondary to iron deficiency.-See above.  #Weight gain-defer to PCP.   #Vitamin D deficiency-gastric malabsorption on high-dose vitamin D per PCP.  Thank you Dr.Mc-Clean for allowing me to participate in the care of your pleasant patient. Please do not hesitate to contact me with questions or concerns in the interim.   # 45 minutes face-to-face with the patient discussing the above plan of care; more than 50% of time spent on prognosis/ natural history; counseling and coordination.  # DISPOSITION: # IV Ferrahem/ B12 IM weekly x4- start in 1 week # Follow up in 5 weeks/labs-cbc/ldh/Ferrahem/b12-Dr.B

## 2019-01-05 ENCOUNTER — Inpatient Hospital Stay: Payer: BLUE CROSS/BLUE SHIELD

## 2019-01-05 VITALS — BP 137/85 | HR 63 | Temp 98.2°F | Resp 18

## 2019-01-05 DIAGNOSIS — E669 Obesity, unspecified: Secondary | ICD-10-CM | POA: Diagnosis not present

## 2019-01-05 DIAGNOSIS — R5383 Other fatigue: Secondary | ICD-10-CM | POA: Diagnosis not present

## 2019-01-05 DIAGNOSIS — G4700 Insomnia, unspecified: Secondary | ICD-10-CM | POA: Diagnosis not present

## 2019-01-05 DIAGNOSIS — Z79899 Other long term (current) drug therapy: Secondary | ICD-10-CM | POA: Diagnosis not present

## 2019-01-05 DIAGNOSIS — Z86718 Personal history of other venous thrombosis and embolism: Secondary | ICD-10-CM | POA: Diagnosis not present

## 2019-01-05 DIAGNOSIS — E538 Deficiency of other specified B group vitamins: Secondary | ICD-10-CM

## 2019-01-05 DIAGNOSIS — Z9884 Bariatric surgery status: Secondary | ICD-10-CM | POA: Diagnosis not present

## 2019-01-05 DIAGNOSIS — I10 Essential (primary) hypertension: Secondary | ICD-10-CM | POA: Diagnosis not present

## 2019-01-05 DIAGNOSIS — D508 Other iron deficiency anemias: Secondary | ICD-10-CM | POA: Diagnosis not present

## 2019-01-05 DIAGNOSIS — K909 Intestinal malabsorption, unspecified: Secondary | ICD-10-CM

## 2019-01-05 DIAGNOSIS — G2581 Restless legs syndrome: Secondary | ICD-10-CM | POA: Diagnosis not present

## 2019-01-05 DIAGNOSIS — E559 Vitamin D deficiency, unspecified: Secondary | ICD-10-CM | POA: Diagnosis not present

## 2019-01-05 MED ORDER — CYANOCOBALAMIN 1000 MCG/ML IJ SOLN
1000.0000 ug | Freq: Once | INTRAMUSCULAR | Status: AC
  Administered 2019-01-05: 1000 ug via INTRAMUSCULAR
  Filled 2019-01-05: qty 1

## 2019-01-05 MED ORDER — SODIUM CHLORIDE 0.9 % IV SOLN
Freq: Once | INTRAVENOUS | Status: AC
  Administered 2019-01-05: 14:00:00 via INTRAVENOUS
  Filled 2019-01-05: qty 250

## 2019-01-05 MED ORDER — SODIUM CHLORIDE 0.9 % IV SOLN
510.0000 mg | Freq: Once | INTRAVENOUS | Status: AC
  Administered 2019-01-05: 510 mg via INTRAVENOUS
  Filled 2019-01-05: qty 17

## 2019-01-12 ENCOUNTER — Inpatient Hospital Stay: Payer: BLUE CROSS/BLUE SHIELD

## 2019-01-12 VITALS — BP 155/91 | HR 75 | Temp 96.9°F | Resp 18

## 2019-01-12 DIAGNOSIS — E559 Vitamin D deficiency, unspecified: Secondary | ICD-10-CM | POA: Diagnosis not present

## 2019-01-12 DIAGNOSIS — I10 Essential (primary) hypertension: Secondary | ICD-10-CM | POA: Diagnosis not present

## 2019-01-12 DIAGNOSIS — E538 Deficiency of other specified B group vitamins: Secondary | ICD-10-CM | POA: Diagnosis not present

## 2019-01-12 DIAGNOSIS — Z79899 Other long term (current) drug therapy: Secondary | ICD-10-CM | POA: Diagnosis not present

## 2019-01-12 DIAGNOSIS — Z86718 Personal history of other venous thrombosis and embolism: Secondary | ICD-10-CM | POA: Diagnosis not present

## 2019-01-12 DIAGNOSIS — R5383 Other fatigue: Secondary | ICD-10-CM | POA: Diagnosis not present

## 2019-01-12 DIAGNOSIS — G4700 Insomnia, unspecified: Secondary | ICD-10-CM | POA: Diagnosis not present

## 2019-01-12 DIAGNOSIS — G2581 Restless legs syndrome: Secondary | ICD-10-CM | POA: Diagnosis not present

## 2019-01-12 DIAGNOSIS — K909 Intestinal malabsorption, unspecified: Secondary | ICD-10-CM | POA: Diagnosis not present

## 2019-01-12 DIAGNOSIS — D508 Other iron deficiency anemias: Secondary | ICD-10-CM | POA: Diagnosis not present

## 2019-01-12 DIAGNOSIS — Z9884 Bariatric surgery status: Secondary | ICD-10-CM | POA: Diagnosis not present

## 2019-01-12 DIAGNOSIS — E669 Obesity, unspecified: Secondary | ICD-10-CM | POA: Diagnosis not present

## 2019-01-12 MED ORDER — SODIUM CHLORIDE 0.9 % IV SOLN
510.0000 mg | Freq: Once | INTRAVENOUS | Status: AC
  Administered 2019-01-12: 510 mg via INTRAVENOUS
  Filled 2019-01-12: qty 17

## 2019-01-12 MED ORDER — CYANOCOBALAMIN 1000 MCG/ML IJ SOLN
1000.0000 ug | Freq: Once | INTRAMUSCULAR | Status: AC
  Administered 2019-01-12: 1000 ug via INTRAMUSCULAR
  Filled 2019-01-12: qty 1

## 2019-01-12 MED ORDER — SODIUM CHLORIDE 0.9 % IV SOLN
Freq: Once | INTRAVENOUS | Status: AC
  Administered 2019-01-12: 14:00:00 via INTRAVENOUS
  Filled 2019-01-12: qty 250

## 2019-01-19 ENCOUNTER — Inpatient Hospital Stay: Payer: BLUE CROSS/BLUE SHIELD | Attending: Internal Medicine

## 2019-01-19 VITALS — BP 152/94 | HR 81 | Temp 97.3°F | Resp 20

## 2019-01-19 DIAGNOSIS — E538 Deficiency of other specified B group vitamins: Secondary | ICD-10-CM | POA: Diagnosis not present

## 2019-01-19 DIAGNOSIS — Z9884 Bariatric surgery status: Secondary | ICD-10-CM | POA: Diagnosis not present

## 2019-01-19 DIAGNOSIS — D509 Iron deficiency anemia, unspecified: Secondary | ICD-10-CM | POA: Diagnosis not present

## 2019-01-19 DIAGNOSIS — K909 Intestinal malabsorption, unspecified: Secondary | ICD-10-CM | POA: Diagnosis not present

## 2019-01-19 MED ORDER — SODIUM CHLORIDE 0.9 % IV SOLN
Freq: Once | INTRAVENOUS | Status: AC
  Administered 2019-01-19: 14:00:00 via INTRAVENOUS
  Filled 2019-01-19: qty 250

## 2019-01-19 MED ORDER — SODIUM CHLORIDE 0.9 % IV SOLN
510.0000 mg | Freq: Once | INTRAVENOUS | Status: AC
  Administered 2019-01-19: 510 mg via INTRAVENOUS
  Filled 2019-01-19: qty 17

## 2019-01-19 MED ORDER — CYANOCOBALAMIN 1000 MCG/ML IJ SOLN
1000.0000 ug | Freq: Once | INTRAMUSCULAR | Status: AC
  Administered 2019-01-19: 1000 ug via INTRAMUSCULAR
  Filled 2019-01-19: qty 1

## 2019-01-26 ENCOUNTER — Inpatient Hospital Stay: Payer: BLUE CROSS/BLUE SHIELD | Attending: Internal Medicine

## 2019-01-26 ENCOUNTER — Other Ambulatory Visit: Payer: Self-pay

## 2019-01-26 VITALS — BP 132/86 | HR 78 | Resp 18

## 2019-01-26 DIAGNOSIS — D509 Iron deficiency anemia, unspecified: Secondary | ICD-10-CM | POA: Insufficient documentation

## 2019-01-26 DIAGNOSIS — I10 Essential (primary) hypertension: Secondary | ICD-10-CM | POA: Diagnosis not present

## 2019-01-26 DIAGNOSIS — E538 Deficiency of other specified B group vitamins: Secondary | ICD-10-CM

## 2019-01-26 DIAGNOSIS — K909 Intestinal malabsorption, unspecified: Secondary | ICD-10-CM | POA: Diagnosis not present

## 2019-01-26 DIAGNOSIS — Z9884 Bariatric surgery status: Secondary | ICD-10-CM | POA: Insufficient documentation

## 2019-01-26 DIAGNOSIS — Z79899 Other long term (current) drug therapy: Secondary | ICD-10-CM | POA: Diagnosis not present

## 2019-01-26 MED ORDER — SODIUM CHLORIDE 0.9 % IV SOLN
510.0000 mg | Freq: Once | INTRAVENOUS | Status: AC
  Administered 2019-01-26: 510 mg via INTRAVENOUS
  Filled 2019-01-26: qty 17

## 2019-01-26 MED ORDER — CYANOCOBALAMIN 1000 MCG/ML IJ SOLN
1000.0000 ug | Freq: Once | INTRAMUSCULAR | Status: AC
  Administered 2019-01-26: 1000 ug via INTRAMUSCULAR
  Filled 2019-01-26: qty 1

## 2019-01-26 MED ORDER — SODIUM CHLORIDE 0.9 % IV SOLN
Freq: Once | INTRAVENOUS | Status: AC
  Administered 2019-01-26: 14:00:00 via INTRAVENOUS
  Filled 2019-01-26: qty 250

## 2019-01-30 ENCOUNTER — Other Ambulatory Visit: Payer: Self-pay

## 2019-01-30 ENCOUNTER — Other Ambulatory Visit: Payer: Self-pay | Admitting: *Deleted

## 2019-01-30 DIAGNOSIS — D509 Iron deficiency anemia, unspecified: Secondary | ICD-10-CM

## 2019-01-30 DIAGNOSIS — K909 Intestinal malabsorption, unspecified: Secondary | ICD-10-CM

## 2019-02-02 ENCOUNTER — Other Ambulatory Visit: Payer: Self-pay

## 2019-02-03 ENCOUNTER — Other Ambulatory Visit: Payer: Self-pay

## 2019-02-03 ENCOUNTER — Inpatient Hospital Stay (HOSPITAL_BASED_OUTPATIENT_CLINIC_OR_DEPARTMENT_OTHER): Payer: BLUE CROSS/BLUE SHIELD | Admitting: Internal Medicine

## 2019-02-03 ENCOUNTER — Inpatient Hospital Stay: Payer: BLUE CROSS/BLUE SHIELD

## 2019-02-03 ENCOUNTER — Encounter: Payer: Self-pay | Admitting: Internal Medicine

## 2019-02-03 VITALS — BP 134/83 | HR 64 | Temp 97.2°F | Resp 18 | Wt 363.9 lb

## 2019-02-03 DIAGNOSIS — Z79899 Other long term (current) drug therapy: Secondary | ICD-10-CM | POA: Diagnosis not present

## 2019-02-03 DIAGNOSIS — E538 Deficiency of other specified B group vitamins: Secondary | ICD-10-CM | POA: Diagnosis not present

## 2019-02-03 DIAGNOSIS — K909 Intestinal malabsorption, unspecified: Secondary | ICD-10-CM

## 2019-02-03 DIAGNOSIS — Z9884 Bariatric surgery status: Secondary | ICD-10-CM | POA: Diagnosis not present

## 2019-02-03 DIAGNOSIS — D509 Iron deficiency anemia, unspecified: Secondary | ICD-10-CM | POA: Diagnosis not present

## 2019-02-03 DIAGNOSIS — I10 Essential (primary) hypertension: Secondary | ICD-10-CM | POA: Diagnosis not present

## 2019-02-03 LAB — CBC WITH DIFFERENTIAL/PLATELET
ABS IMMATURE GRANULOCYTES: 0.01 10*3/uL (ref 0.00–0.07)
Basophils Absolute: 0 10*3/uL (ref 0.0–0.1)
Basophils Relative: 1 %
Eosinophils Absolute: 0 10*3/uL (ref 0.0–0.5)
Eosinophils Relative: 0 %
HCT: 37.5 % (ref 36.0–46.0)
Hemoglobin: 11 g/dL — ABNORMAL LOW (ref 12.0–15.0)
Immature Granulocytes: 0 %
Lymphocytes Relative: 46 %
Lymphs Abs: 2.5 10*3/uL (ref 0.7–4.0)
MCH: 23.5 pg — ABNORMAL LOW (ref 26.0–34.0)
MCHC: 29.3 g/dL — ABNORMAL LOW (ref 30.0–36.0)
MCV: 80.1 fL (ref 80.0–100.0)
MONOS PCT: 7 %
Monocytes Absolute: 0.4 10*3/uL (ref 0.1–1.0)
Neutro Abs: 2.6 10*3/uL (ref 1.7–7.7)
Neutrophils Relative %: 46 %
Platelets: 328 10*3/uL (ref 150–400)
RBC: 4.68 MIL/uL (ref 3.87–5.11)
WBC: 5.5 10*3/uL (ref 4.0–10.5)
nRBC: 0 % (ref 0.0–0.2)

## 2019-02-03 LAB — COMPREHENSIVE METABOLIC PANEL
ALBUMIN: 3.8 g/dL (ref 3.5–5.0)
ALT: 31 U/L (ref 0–44)
AST: 38 U/L (ref 15–41)
Alkaline Phosphatase: 108 U/L (ref 38–126)
Anion gap: 6 (ref 5–15)
BUN: 10 mg/dL (ref 6–20)
CO2: 24 mmol/L (ref 22–32)
Calcium: 8.6 mg/dL — ABNORMAL LOW (ref 8.9–10.3)
Chloride: 106 mmol/L (ref 98–111)
Creatinine, Ser: 0.78 mg/dL (ref 0.44–1.00)
GFR calc Af Amer: >60 mL/min (ref >60)
GFR calc non Af Amer: >60 mL/min (ref >60)
GLUCOSE: 85 mg/dL (ref 70–99)
Potassium: 4.1 mmol/L (ref 3.5–5.1)
Sodium: 136 mmol/L (ref 135–145)
Total Bilirubin: 0.5 mg/dL (ref 0.3–1.2)
Total Protein: 7.6 g/dL (ref 6.5–8.1)

## 2019-02-03 LAB — LACTATE DEHYDROGENASE: LDH: 160 U/L (ref 98–192)

## 2019-02-03 NOTE — Progress Notes (Signed)
Patient here for follow up. No concerns voiced.  °

## 2019-02-03 NOTE — Progress Notes (Signed)
[place] NOTE  Patient Care Team: McLean-Scocuzza, Nino Glow, MD as PCP - General (Internal Medicine)  CHIEF COMPLAINTS/PURPOSE OF CONSULTATION:    HEMATOLOGY HISTORY  # IRON DEF ANEMIA- sec to gastric bypass [pcp- ferritin6/Hb-8 MCV 68; B12 -low; vit D-low]  # gastric bypass [2007; [place]]   HISTORY OF PRESENTING ILLNESS:  Alyssa Cain 43 y.o.  female with a history of iron deficiency anemia secondary to gastric bypass is here for follow-up.  Patient currently status post 4 infusions of IV iron.  No i infusion reactions.  Energy levels improved.  Mild fatigue currently.  Review of Systems  Constitutional: Positive for malaise/fatigue. Negative for chills, diaphoresis, fever and weight loss.  HENT: Negative for nosebleeds and sore throat.   Eyes: Negative for double vision.  Respiratory: Negative for cough, hemoptysis, sputum production, shortness of breath and wheezing.   Cardiovascular: Negative for chest pain, palpitations, orthopnea and leg swelling.  Gastrointestinal: Negative for abdominal pain, blood in stool, constipation, diarrhea, heartburn, melena, nausea and vomiting.  Genitourinary: Negative for dysuria, frequency and urgency.  Musculoskeletal: Positive for myalgias. Negative for back pain and joint pain.  Skin: Negative.  Negative for itching and rash.  Neurological: Positive for tingling. Negative for dizziness, focal weakness, weakness and headaches.  Endo/Heme/Allergies: Does not bruise/bleed easily.  Psychiatric/Behavioral: Negative for depression. The patient has insomnia. The patient is not nervous/anxious.     MEDICAL HISTORY:  Past Medical History:  Diagnosis Date  . Anemia   . Anxiety   . Depression   . Hypertension   . Iron deficiency anemia   . Right leg DVT ([place])     SURGICAL HISTORY: Past Surgical History:  Procedure Laterality Date  . GASTRIC BYPASS     2007    SOCIAL HISTORY: Social History    Socioeconomic History  . Marital status: Married    Spouse name: Not on file  . Number of children: Not on file  . Years of education: Not on file  . Highest education level: Not on file  Occupational History  . Not on file  Social Needs  . Financial resource strain: Not on file  . Food insecurity:    Worry: Not on file    Inability: Not on file  . Transportation needs:    Medical: Not on file    Non-medical: Not on file  Tobacco Use  . Smoking status: Never Smoker  . Smokeless tobacco: Never Used  Substance and Sexual Activity  . Alcohol use: Not Currently  . Drug use: Not Currently  . Sexual activity: Yes    Comment: men  Lifestyle  . Physical activity:    Days per week: Not on file    Minutes per session: Not on file  . Stress: Not on file  Relationships  . Social connections:    Talks on phone: Not on file    Gets together: Not on file    Attends religious service: Not on file    Active member of club or organization: Not on file    Attends meetings of clubs or organizations: Not on file    Relationship status: Not on file  . Intimate partner violence:    Fear of current or ex partner: Not on file    Emotionally abused: Not on file    Physically abused: Not on file    Forced sexual activity: Not on file  Other Topics Concern  . Not on file  Social History Narrative   Married  Gabriel    2 kids 60 and 83 y.o as of 12/20/2018    Moved from [place] to [place] to [place]    Owns guns, wears seat belt, safe in relationship    3 pregnancies 2 live births       Work at [employer] in [employer].     FAMILY HISTORY: Family History  Problem Relation Age of Onset  . Hypertension Mother   . Hyperlipidemia Father   . Diabetes Maternal Grandmother     ALLERGIES:  has No Known Allergies.  MEDICATIONS:  Current Outpatient Medications  Medication Sig Dispense Refill  . Cholecalciferol 1.25 MG (50000 UT) capsule Take 1 capsule (50,000 Units total) by mouth once a week. 13  capsule 1  . sertraline (ZOLOFT) 25 MG tablet Take 1 tablet (25 mg total) by mouth daily. In am 90 tablet 0   No current facility-administered medications for this visit.       PHYSICAL EXAMINATION:   Vitals:   02/03/19 1354  BP: 134/83  Pulse: 64  Resp: 18  Temp: (!) 97.2 F (36.2 C)   Filed Weights   02/03/19 1354  Weight: (!) 363 lb 14.4 oz (165.1 kg)    Physical Exam  Constitutional: She is oriented to person, place, and time and well-developed, well-nourished, and in no distress.  HENT:  Head: Normocephalic and atraumatic.  Mouth/Throat: Oropharynx is clear and moist. No oropharyngeal exudate.  Eyes: Pupils are equal, round, and reactive to light.  Neck: Normal range of motion. Neck supple.  Cardiovascular: Normal rate and regular rhythm.  Pulmonary/Chest: No respiratory distress. She has no wheezes.  Abdominal: Soft. Bowel sounds are normal. She exhibits no distension and no mass. There is no abdominal tenderness. There is no rebound and no guarding.  Musculoskeletal: Normal range of motion.        General: No tenderness or edema.  Neurological: She is alert and oriented to person, place, and time.  Skin: Skin is warm.  Psychiatric: Affect normal.    LABORATORY DATA:  I have reviewed the data as listed Lab Results  Component Value Date   WBC 5.5 02/03/2019   HGB 11.0 (L) 02/03/2019   HCT 37.5 02/03/2019   MCV 80.1 02/03/2019   PLT 328 02/03/2019   Recent Labs    12/23/18 1028 02/03/19 1337  NA 137 136  K 3.7 4.1  CL 103 106  CO2 26 24  GLUCOSE 75 85  BUN 9 10  CREATININE 0.77 0.78  CALCIUM 7.8* 8.6*  GFRNONAA  --  >60  GFRAA  --  >60  PROT 7.3 7.6  ALBUMIN 3.9 3.8  AST 19 38  ALT 8 31  ALKPHOS 135* 108  BILITOT 0.4 0.5     No results found.  Malabsorption of iron # Iron def anemia sec to iron malabsorption-/gastric bypass. S/p IV Ferrahem.  Hemoglobin improved over 11.  # B12 B12 deficiency gastric bypass-recommend B12 injections  initially weekly then monthly.  Also discussed regarding sublingual B12.   # DISPOSITION: DISPOSITION: # NO infusion today # follow up in 2 months/cbc/possible Ferrahem/B12 injection- dr.B    All questions were answered. The patient knows to call the clinic with any problems, questions or concerns.      Cammie Sickle, MD 02/06/2019 1:30 PM

## 2019-02-03 NOTE — Patient Instructions (Signed)
#   Take sublingual B12 1041mcg/day.

## 2019-02-03 NOTE — Assessment & Plan Note (Addendum)
#   Iron def anemia sec to iron malabsorption-/gastric bypass. S/p IV Ferrahem.  Hemoglobin improved over 11.  # B12 B12 deficiency gastric bypass-recommend B12 injections initially weekly then monthly.  Also discussed regarding sublingual B12.   # DISPOSITION: DISPOSITION: # NO infusion today # follow up in 2 months/cbc/possible Ferrahem/B12 injection- dr.B

## 2019-02-06 ENCOUNTER — Telehealth: Payer: Self-pay | Admitting: Internal Medicine

## 2019-02-06 NOTE — Telephone Encounter (Signed)
Is she ready to start adipex since her blood cts are 11?   [place]

## 2019-02-07 ENCOUNTER — Telehealth: Payer: Self-pay

## 2019-02-07 ENCOUNTER — Other Ambulatory Visit: Payer: Self-pay | Admitting: Internal Medicine

## 2019-02-07 MED ORDER — PHENTERMINE HCL 37.5 MG PO TABS: 37.5000 mg | ORAL_TABLET | Freq: Every day | ORAL | 0 refills | Status: DC

## 2019-02-07 NOTE — Telephone Encounter (Signed)
Patient and husband would like their adipex sent [facility] in [place] due to cost.

## 2019-02-07 NOTE — Telephone Encounter (Signed)
Patient is ok with starting the medication

## 2019-03-03 ENCOUNTER — Ambulatory Visit: Payer: BLUE CROSS/BLUE SHIELD | Admitting: Internal Medicine

## 2019-03-03 ENCOUNTER — Ambulatory Visit (INDEPENDENT_AMBULATORY_CARE_PROVIDER_SITE_OTHER): Payer: BLUE CROSS/BLUE SHIELD | Admitting: Internal Medicine

## 2019-03-03 DIAGNOSIS — D509 Iron deficiency anemia, unspecified: Secondary | ICD-10-CM | POA: Diagnosis not present

## 2019-03-03 DIAGNOSIS — G4700 Insomnia, unspecified: Secondary | ICD-10-CM | POA: Diagnosis not present

## 2019-03-03 DIAGNOSIS — I10 Essential (primary) hypertension: Secondary | ICD-10-CM | POA: Diagnosis not present

## 2019-03-03 DIAGNOSIS — F419 Anxiety disorder, unspecified: Secondary | ICD-10-CM | POA: Diagnosis not present

## 2019-03-03 DIAGNOSIS — F329 Major depressive disorder, single episode, unspecified: Secondary | ICD-10-CM

## 2019-03-03 MED ORDER — HYDROCHLOROTHIAZIDE 12.5 MG PO TABS: 12.5000 mg | ORAL_TABLET | Freq: Every day | ORAL | 0 refills | Status: DC

## 2019-03-03 MED ORDER — VENLAFAXINE HCL ER 37.5 MG PO CP24: 37.5000 mg | ORAL_CAPSULE | Freq: Every day | ORAL | 0 refills | Status: DC

## 2019-03-03 MED ORDER — ZOLPIDEM TARTRATE 5 MG PO TABS: 5.0000 mg | ORAL_TABLET | Freq: Every evening | ORAL | 2 refills | Status: DC | PRN

## 2019-03-03 NOTE — Progress Notes (Signed)
Virtual Visit via Video Note [place]  I connected with Alyssa Cain  on 03/03/19 at  1:35 PM EDT by a video enabled telemedicine application and verified that I am speaking with the correct person using two identifiers.  Location patient: home Location provider:work Persons participating in the virtual visit: patient, provider  I discussed the limitations of evaluation and management by telemedicine and the availability of in person appointments. The patient expressed understanding and agreed to proceed.   HPI: 1. HTN elevated intermittently before adipex she does not have a way to check at home  2. Obesity wt from 368 to 357 to 363 she is not exercising but taking adipex 37.5 will start exercising  3. Iron def anemia she does have heavy menses and in the past OB/GYN disc hysterectomy which she would be agreeable to f/u with h/o iron infusion 04/05/19 for now  4. Anxiety/depression zoloft 25 mg qd did not help. Insomnia trouble falling asleep trazadone in the past did not like the way it made her feel having trouble falling asleep    ROS: See pertinent positives and negatives per HPI.  Past Medical History:  Diagnosis Date  . Anemia   . Anxiety   . Depression   . Hypertension   . Iron deficiency anemia   . Right leg DVT [facility])     Past Surgical History:  Procedure Laterality Date  . GASTRIC BYPASS     2007    Family History  Problem Relation Age of Onset  . Hypertension Mother   . Hyperlipidemia Father   . Diabetes Maternal Grandmother     SOCIAL HX: lives at home with husband and 2 kids    Current Outpatient Medications:  .  Cholecalciferol 1.25 MG (50000 UT) capsule, Take 1 capsule (50,000 Units total) by mouth once a week., Disp: 13 capsule, Rfl: 1 .  hydrochlorothiazide (HYDRODIURIL) 12.5 MG tablet, Take 1 tablet (12.5 mg total) by mouth daily., Disp: 30 tablet, Rfl: 0 .  phentermine (ADIPEX-P) 37.5 MG tablet, Take 1 tablet (37.5 mg total) by mouth daily before  breakfast. Rx 1/2, Disp: 60 tablet, Rfl: 0 .  venlafaxine XR (EFFEXOR XR) 37.5 MG 24 hr capsule, Take 1 capsule (37.5 mg total) by mouth daily with breakfast. May take 2 pills qd week 2 if working, Disp: 60 capsule, Rfl: 0 .  zolpidem (AMBIEN) 5 MG tablet, Take 1 tablet (5 mg total) by mouth at bedtime as needed for sleep., Disp: 30 tablet, Rfl: 2  EXAM:  VITALS per patient if applicable:  GENERAL: alert, oriented, appears well and in no acute distress  HEENT: atraumatic, conjunttiva clear, no obvious abnormalities on inspection of external nose and ears  NECK: normal movements of the head and neck  LUNGS: on inspection no signs of respiratory distress, breathing rate appears normal, no obvious gross SOB, gasping or wheezing  CV: no obvious cyanosis  MS: moves all visible extremities without noticeable abnormality  PSYCH/NEURO: pleasant and cooperative, no obvious depression or anxiety, speech and thought processing grossly intact  ASSESSMENT AND PLAN:  Discussed the following assessment and plan:  Essential hypertension - Plan: hydrochlorothiazide (HYDRODIURIL) 12.5 MG tablet  Anxiety and depression - Plan: venlafaxine XR (EFFEXOR XR) 37.5 MG 24 hr capsule d/c zoloft 25 mg qd  My chart in 2-4 weeks about mood anxiety and sleep   Insomnia, unspecified type - Plan: zolpidem (AMBIEN) 5 MG tablet had bad reaction to trazadone in the past ambien helped in the past   Iron  deficiency anemia, unspecified iron deficiency anemia type -consider OB/GYN referral in future pt will research ob/gyn and let me know   HM Declines flu shot of not she had flu 2 weeks ago entire family. Last flu shot in 2018  Tdap thinks had in 2018  LMP 12/09/2018, Pap 2018 OB/GYN in [place] get records  Pap negative 10/11/17 neg pap neg HPV  Former PCP Dr. Kathe Mariner [facility]  Get records [facility] and [place] seen 01/17/18 DVT right  lower ext 11/13/2016 s/p Eliquis x 2 months and iron def anemia hbg 6.4, 7.6  -h/o low Hbg A2 1.6 range 1.8-3.2 may indicate alpha thal or iron def anemia  H/o elevated lfts AST 42 range was 14-36    I discussed the assessment and treatment plan with the patient. The patient was provided an opportunity to ask questions and all were answered. The patient agreed with the plan and demonstrated an understanding of the instructions.   The patient was advised to call back or seek an in-person evaluation if the symptoms worsen or if the condition fails to improve as anticipated.  Time spent 25 minutes  Delorise Jackson, MD

## 2019-03-21 ENCOUNTER — Other Ambulatory Visit: Payer: Self-pay | Admitting: Internal Medicine

## 2019-03-21 ENCOUNTER — Encounter: Payer: Self-pay | Admitting: Internal Medicine

## 2019-03-21 DIAGNOSIS — F32A Depression, unspecified: Secondary | ICD-10-CM

## 2019-03-21 DIAGNOSIS — F329 Major depressive disorder, single episode, unspecified: Secondary | ICD-10-CM

## 2019-03-21 DIAGNOSIS — F419 Anxiety disorder, unspecified: Principal | ICD-10-CM

## 2019-03-21 MED ORDER — VENLAFAXINE HCL ER 150 MG PO CP24: 150.0000 mg | ORAL_CAPSULE | Freq: Every day | ORAL | 5 refills | Status: DC

## 2019-03-21 MED ORDER — VENLAFAXINE HCL ER 37.5 MG PO CP24: 75.0000 mg | ORAL_CAPSULE | Freq: Every day | ORAL | 0 refills | Status: DC

## 2019-04-04 ENCOUNTER — Other Ambulatory Visit: Payer: Self-pay

## 2019-04-05 ENCOUNTER — Ambulatory Visit: Payer: BLUE CROSS/BLUE SHIELD | Admitting: Internal Medicine

## 2019-04-05 ENCOUNTER — Inpatient Hospital Stay: Payer: BC Managed Care – PPO | Attending: Internal Medicine

## 2019-04-05 ENCOUNTER — Inpatient Hospital Stay: Payer: BC Managed Care – PPO

## 2019-04-05 ENCOUNTER — Other Ambulatory Visit: Payer: Self-pay

## 2019-04-05 VITALS — BP 135/78 | HR 88 | Temp 97.5°F | Resp 20

## 2019-04-05 DIAGNOSIS — Z9884 Bariatric surgery status: Secondary | ICD-10-CM | POA: Diagnosis not present

## 2019-04-05 DIAGNOSIS — N920 Excessive and frequent menstruation with regular cycle: Secondary | ICD-10-CM | POA: Insufficient documentation

## 2019-04-05 DIAGNOSIS — K909 Intestinal malabsorption, unspecified: Secondary | ICD-10-CM

## 2019-04-05 DIAGNOSIS — D509 Iron deficiency anemia, unspecified: Secondary | ICD-10-CM | POA: Diagnosis not present

## 2019-04-05 DIAGNOSIS — E538 Deficiency of other specified B group vitamins: Secondary | ICD-10-CM | POA: Insufficient documentation

## 2019-04-05 DIAGNOSIS — D508 Other iron deficiency anemias: Secondary | ICD-10-CM | POA: Insufficient documentation

## 2019-04-05 LAB — CBC WITH DIFFERENTIAL/PLATELET
Abs Immature Granulocytes: 0.01 10*3/uL (ref 0.00–0.07)
Basophils Absolute: 0 10*3/uL (ref 0.0–0.1)
Basophils Relative: 1 %
Eosinophils Absolute: 0 10*3/uL (ref 0.0–0.5)
Eosinophils Relative: 1 %
HCT: 40.3 % (ref 36.0–46.0)
Hemoglobin: 12.4 g/dL (ref 12.0–15.0)
Immature Granulocytes: 0 %
Lymphocytes Relative: 35 %
Lymphs Abs: 2.3 10*3/uL (ref 0.7–4.0)
MCH: 27.6 pg (ref 26.0–34.0)
MCHC: 30.8 g/dL (ref 30.0–36.0)
MCV: 89.6 fL (ref 80.0–100.0)
Monocytes Absolute: 0.5 10*3/uL (ref 0.1–1.0)
Monocytes Relative: 7 %
Neutro Abs: 3.6 10*3/uL (ref 1.7–7.7)
Neutrophils Relative %: 56 %
Platelets: 287 10*3/uL (ref 150–400)
RBC: 4.5 MIL/uL (ref 3.87–5.11)
RDW: 16.2 % — ABNORMAL HIGH (ref 11.5–15.5)
WBC: 6.4 10*3/uL (ref 4.0–10.5)
nRBC: 0 % (ref 0.0–0.2)

## 2019-04-05 MED ORDER — SODIUM CHLORIDE 0.9 % IV SOLN
510.0000 mg | Freq: Once | INTRAVENOUS | Status: AC
  Administered 2019-04-05: 510 mg via INTRAVENOUS
  Filled 2019-04-05: qty 17

## 2019-04-05 MED ORDER — CYANOCOBALAMIN 1000 MCG/ML IJ SOLN
1000.0000 ug | Freq: Once | INTRAMUSCULAR | Status: AC
  Administered 2019-04-05: 1000 ug via INTRAMUSCULAR
  Filled 2019-04-05: qty 1

## 2019-04-05 MED ORDER — SODIUM CHLORIDE 0.9 % IV SOLN
Freq: Once | INTRAVENOUS | Status: AC
  Administered 2019-04-05: 14:00:00 via INTRAVENOUS
  Filled 2019-04-05: qty 250

## 2019-04-06 ENCOUNTER — Other Ambulatory Visit: Payer: Self-pay

## 2019-04-06 ENCOUNTER — Inpatient Hospital Stay (HOSPITAL_BASED_OUTPATIENT_CLINIC_OR_DEPARTMENT_OTHER): Payer: BC Managed Care – PPO | Admitting: Internal Medicine

## 2019-04-06 ENCOUNTER — Encounter: Payer: Self-pay | Admitting: Internal Medicine

## 2019-04-06 ENCOUNTER — Other Ambulatory Visit: Payer: Self-pay | Admitting: *Deleted

## 2019-04-06 ENCOUNTER — Other Ambulatory Visit: Payer: Self-pay | Admitting: Internal Medicine

## 2019-04-06 DIAGNOSIS — K909 Intestinal malabsorption, unspecified: Secondary | ICD-10-CM

## 2019-04-06 DIAGNOSIS — E538 Deficiency of other specified B group vitamins: Secondary | ICD-10-CM

## 2019-04-06 DIAGNOSIS — D508 Other iron deficiency anemias: Secondary | ICD-10-CM | POA: Diagnosis not present

## 2019-04-06 DIAGNOSIS — Z9884 Bariatric surgery status: Secondary | ICD-10-CM

## 2019-04-06 DIAGNOSIS — I10 Essential (primary) hypertension: Secondary | ICD-10-CM

## 2019-04-06 DIAGNOSIS — Z79899 Other long term (current) drug therapy: Secondary | ICD-10-CM

## 2019-04-06 MED ORDER — HYDROCHLOROTHIAZIDE 12.5 MG PO TABS: 12.5000 mg | ORAL_TABLET | Freq: Every day | ORAL | 3 refills | Status: DC

## 2019-04-06 NOTE — Assessment & Plan Note (Addendum)
#   Iron def anemia sec to iron malabsorption-/gastric bypass-menstrual blood loss. S/p IV Ferrahem.  Hemoglobin improved over 12-however patient continues to have heavy menstrual.'s/symptomatic with fatigue.  Recommend IV Feraheme.  # B12 deficiency gastric bypass-continue B12 injections with IV Ferrahem.   # Menorrhagia chronic -previously recommend ? Hysterectomy; recommend talking to PCP regarding birth control pills/temporary measure during covid pandemic.   # DISPOSITION: # schedule IV ferrahem/B12 weekly X 2- start next week.  # follow up in 4 months/cbc/-MD/labs- cbc/bmp/iron studies/ferritin/b12-possible Ferrahem/B12 injection- dr.B

## 2019-04-06 NOTE — Progress Notes (Signed)
I connected with Alyssa Cain on 04/06/19 at 10:15 AM EDT by video enabled telemedicine visit and verified that I am speaking with the correct person using two identifiers.  I discussed the limitations, risks, security and privacy concerns of performing an evaluation and management service by telemedicine and the availability of in-person appointments. I also discussed with the patient that there may be a patient responsible charge related to this service. The patient expressed understanding and agreed to proceed.    Other persons participating in the visit and their role in the encounter: none  Patient's location: home  Provider's location: home    No history exists.     Chief Complaint: Anemia   History of present illness:Alyssa Cain 43 y.o.  female with history of iron deficiency anemia-menstrual blood loss/gastric bypass is here for follow-up.  Patient unfortunately is continuing to having significant menstrual blood loss.  She is not currently on birth control pills.  Previously she has been recommended hysterectomy.  She complains of significant fatigue.  Denies any blood in stools or black or stools.  Observation/objective: Hemoglobin 12.2.  Assessment and plan: Malabsorption of iron # Iron def anemia sec to iron malabsorption-/gastric bypass-menstrual blood loss. S/p IV Ferrahem.  Hemoglobin improved over 12-however patient continues to have heavy menstrual.'s/symptomatic with fatigue.  Recommend IV Feraheme.  # B12 deficiency gastric bypass-continue B12 injections with IV Ferrahem.   # Menorrhagia chronic -previously recommend ? Hysterectomy; recommend talking to PCP regarding birth control pills/temporary measure during covid pandemic.   # DISPOSITION: # schedule IV ferrahem/B12 weekly X 2- start next week.  # follow up in 4 months/cbc/-MD/labs- cbc/bmp/iron studies/ferritin/b12-possible Ferrahem/B12 injection- dr.B  Follow-up instructions:  I discussed the assessment  and treatment plan with the patient.  The patient was provided an opportunity to ask questions and all were answered.  The patient agreed with the plan and demonstrated understanding of instructions.  The patient was advised to call back or seek an in person evaluation if the symptoms worsen or if the condition fails to improve as anticipated.  Dr. Charlaine Dalton [facility] 04/06/2019 10:26 AM

## 2019-04-13 ENCOUNTER — Other Ambulatory Visit: Payer: Self-pay

## 2019-04-13 ENCOUNTER — Inpatient Hospital Stay: Payer: BC Managed Care – PPO

## 2019-04-13 VITALS — BP 137/89 | HR 71 | Temp 97.0°F | Resp 17

## 2019-04-13 DIAGNOSIS — N920 Excessive and frequent menstruation with regular cycle: Secondary | ICD-10-CM | POA: Diagnosis not present

## 2019-04-13 DIAGNOSIS — K909 Intestinal malabsorption, unspecified: Secondary | ICD-10-CM

## 2019-04-13 DIAGNOSIS — D508 Other iron deficiency anemias: Secondary | ICD-10-CM | POA: Diagnosis not present

## 2019-04-13 DIAGNOSIS — Z9884 Bariatric surgery status: Secondary | ICD-10-CM | POA: Diagnosis not present

## 2019-04-13 DIAGNOSIS — E538 Deficiency of other specified B group vitamins: Secondary | ICD-10-CM

## 2019-04-13 DIAGNOSIS — D509 Iron deficiency anemia, unspecified: Secondary | ICD-10-CM | POA: Diagnosis not present

## 2019-04-13 MED ORDER — CYANOCOBALAMIN 1000 MCG/ML IJ SOLN
1000.0000 ug | Freq: Once | INTRAMUSCULAR | Status: AC
  Administered 2019-04-13: 1000 ug via INTRAMUSCULAR
  Filled 2019-04-13: qty 1

## 2019-04-13 MED ORDER — SODIUM CHLORIDE 0.9 % IV SOLN
Freq: Once | INTRAVENOUS | Status: AC
  Administered 2019-04-13: 14:00:00 via INTRAVENOUS
  Filled 2019-04-13: qty 250

## 2019-04-13 MED ORDER — SODIUM CHLORIDE 0.9 % IV SOLN
510.0000 mg | Freq: Once | INTRAVENOUS | Status: AC
  Administered 2019-04-13: 510 mg via INTRAVENOUS
  Filled 2019-04-13: qty 17

## 2019-04-19 ENCOUNTER — Other Ambulatory Visit: Payer: Self-pay

## 2019-04-20 ENCOUNTER — Other Ambulatory Visit: Payer: Self-pay

## 2019-04-20 ENCOUNTER — Inpatient Hospital Stay: Payer: BC Managed Care – PPO | Attending: Internal Medicine

## 2019-04-20 VITALS — BP 129/74 | HR 87 | Resp 19

## 2019-04-20 DIAGNOSIS — N920 Excessive and frequent menstruation with regular cycle: Secondary | ICD-10-CM | POA: Insufficient documentation

## 2019-04-20 DIAGNOSIS — E538 Deficiency of other specified B group vitamins: Secondary | ICD-10-CM | POA: Insufficient documentation

## 2019-04-20 DIAGNOSIS — R5383 Other fatigue: Secondary | ICD-10-CM | POA: Insufficient documentation

## 2019-04-20 DIAGNOSIS — D500 Iron deficiency anemia secondary to blood loss (chronic): Secondary | ICD-10-CM | POA: Diagnosis not present

## 2019-04-20 DIAGNOSIS — Z9884 Bariatric surgery status: Secondary | ICD-10-CM | POA: Insufficient documentation

## 2019-04-20 DIAGNOSIS — K909 Intestinal malabsorption, unspecified: Secondary | ICD-10-CM

## 2019-04-20 MED ORDER — SODIUM CHLORIDE 0.9 % IV SOLN
510.0000 mg | Freq: Once | INTRAVENOUS | Status: AC
  Administered 2019-04-20: 510 mg via INTRAVENOUS
  Filled 2019-04-20: qty 17

## 2019-04-20 MED ORDER — SODIUM CHLORIDE 0.9 % IV SOLN
Freq: Once | INTRAVENOUS | Status: AC
  Administered 2019-04-20: 14:00:00 via INTRAVENOUS
  Filled 2019-04-20: qty 250

## 2019-04-20 MED ORDER — CYANOCOBALAMIN 1000 MCG/ML IJ SOLN
1000.0000 ug | Freq: Once | INTRAMUSCULAR | Status: AC
  Administered 2019-04-20: 1000 ug via INTRAMUSCULAR
  Filled 2019-04-20: qty 1

## 2019-05-08 ENCOUNTER — Encounter: Payer: Self-pay | Admitting: Internal Medicine

## 2019-05-10 ENCOUNTER — Telehealth: Payer: Self-pay | Admitting: Internal Medicine

## 2019-05-10 NOTE — Telephone Encounter (Signed)
Call pt to schedule virtual visit 05/11/19 for her my chart message sent 05/08/19   Thanks tMS

## 2019-05-11 ENCOUNTER — Ambulatory Visit
Admission: RE | Admit: 2019-05-11 | Discharge: 2019-05-11 | Disposition: A | Discharge status: Home / Self Care | Payer: BC Managed Care – PPO | Source: Ambulatory Visit | Attending: Internal Medicine | Admitting: Internal Medicine

## 2019-05-11 ENCOUNTER — Ambulatory Visit: Payer: BC Managed Care – PPO

## 2019-05-11 ENCOUNTER — Ambulatory Visit (INDEPENDENT_AMBULATORY_CARE_PROVIDER_SITE_OTHER): Payer: BC Managed Care – PPO | Admitting: Internal Medicine

## 2019-05-11 ENCOUNTER — Telehealth: Payer: Self-pay

## 2019-05-11 ENCOUNTER — Telehealth: Payer: Self-pay | Admitting: Internal Medicine

## 2019-05-11 ENCOUNTER — Encounter: Payer: Self-pay | Admitting: Internal Medicine

## 2019-05-11 ENCOUNTER — Other Ambulatory Visit: Payer: Self-pay

## 2019-05-11 DIAGNOSIS — M79604 Pain in right leg: Secondary | ICD-10-CM | POA: Insufficient documentation

## 2019-05-11 DIAGNOSIS — M25461 Effusion, right knee: Secondary | ICD-10-CM | POA: Insufficient documentation

## 2019-05-11 DIAGNOSIS — M25561 Pain in right knee: Secondary | ICD-10-CM

## 2019-05-11 MED ORDER — TRAMADOL HCL 50 MG PO TABS: 50.0000 mg | ORAL_TABLET | Freq: Two times a day (BID) | ORAL | 0 refills | Status: AC | PRN

## 2019-05-11 NOTE — Telephone Encounter (Signed)
I rec she do Knee Xray ordered she can walk in   [place]

## 2019-05-11 NOTE — Telephone Encounter (Signed)
She has arthritis in right knee  Does she want referral to orthopedics?   Thanks [employer]

## 2019-05-11 NOTE — Progress Notes (Addendum)
Virtual Visit via Video Note  I connected with [name]  on 05/11/19 at 10:00 AM EDT by a video enabled telemedicine application and verified that I am speaking with the correct person using two identifiers.  Location patient: home Location provider:work  Persons participating in the virtual visit: patient, provider  I discussed the limitations of evaluation and management by telemedicine and the availability of in person appointments. The patient expressed understanding and agreed to proceed.   HPI: 1. X 1 week c/o right knee pain and calf pain and ankle swelling. H/o DVT right leg. Pain 7-8/10 and worse with fully extending knee with wt bearing or walking. Applying pressure in the shower hurts the right leg. Pain is constant worse with wt bearing of shifting her mom in different positions due to her mother being ill. Knee feels like tendon snapped. Aleve and Tylenol otc not working and knee cap feels out of place though she has not had trauma  2. HTN BP 129/74 last checked at h/o on hctx 12.5 mg qd    ROS: See pertinent positives and negatives per HPI.  Past Medical History:  Diagnosis Date  . Anemia   . Anxiety   . Depression   . Hypertension   . Iron deficiency anemia   . Right leg DVT [facility])     Past Surgical History:  Procedure Laterality Date  . GASTRIC BYPASS     2007    Family History  Problem Relation Age of Onset  . Hypertension Mother   . Hyperlipidemia Father   . Diabetes Maternal Grandmother     SOCIAL HX: married with kids    Current Outpatient Medications:  .  Cholecalciferol 1.25 MG (50000 UT) capsule, Take 1 capsule (50,000 Units total) by mouth once a week., Disp: 13 capsule, Rfl: 1 .  hydrochlorothiazide (HYDRODIURIL) 12.5 MG tablet, Take 1 tablet (12.5 mg total) by mouth daily., Disp: 90 tablet, Rfl: 3 .  phentermine (ADIPEX-P) 37.5 MG tablet, Take 1 tablet (37.5 mg total) by mouth daily before breakfast. Rx 1/2, Disp: 60 tablet, Rfl: 0 .   venlafaxine XR (EFFEXOR-XR) 150 MG 24 hr capsule, Take 1 capsule (150 mg total) by mouth daily with breakfast. May take 2 pills qd week 2 if working, Disp: 30 capsule, Rfl: 5 .  zolpidem (AMBIEN) 5 MG tablet, Take 1 tablet (5 mg total) by mouth at bedtime as needed for sleep., Disp: 30 tablet, Rfl: 2 .  traMADol (ULTRAM) 50 MG tablet, Take 1 tablet (50 mg total) by mouth 2 (two) times daily as needed for up to 5 days., Disp: 10 tablet, Rfl: 0  EXAM:  VITALS per patient if applicable:  GENERAL: alert, oriented, appears well and in no acute distress  HEENT: atraumatic, conjunttiva clear, no obvious abnormalities on inspection of external nose and ears  NECK: normal movements of the head and neck  LUNGS: on inspection no signs of respiratory distress, breathing rate appears normal, no obvious gross SOB, gasping or wheezing  CV: no obvious cyanosis  MS: moves all visible extremities without noticeable abnormality  PSYCH/NEURO: pleasant and cooperative, no obvious depression or anxiety, speech and thought processing grossly intact  ASSESSMENT AND PLAN:  Discussed the following assessment and plan:  Pain and swelling of knee, right  R/o bakers cyst, OA, DVT- Plan: DG Knee Complete 4 Views Right, US Venous Img Lower Unilateral Right, traMADol (ULTRAM) 50 MG tablet bid prn #10 short term supply  If Xray neg will do MRI right knee  I discussed the assessment and treatment plan with the patient. The patient was provided an opportunity to ask questions and all were answered. The patient agreed with the plan and demonstrated an understanding of the instructions.   The patient was advised to call back or seek an in-person evaluation if the symptoms worsen or if the condition fails to improve as anticipated.  Time spent 25 minutes  Delorise Jackson, MD

## 2019-05-11 NOTE — Telephone Encounter (Signed)
Patient already had the knee xray awaiting results and I informed her that the U/S was negative for DVT, patient understood.  Nina,cma

## 2019-05-11 NOTE — Telephone Encounter (Signed)
Pt has been scheduled.  °

## 2019-05-12 ENCOUNTER — Other Ambulatory Visit: Payer: Self-pay | Admitting: Internal Medicine

## 2019-05-12 DIAGNOSIS — M1711 Unilateral primary osteoarthritis, right knee: Secondary | ICD-10-CM

## 2019-05-12 DIAGNOSIS — M25561 Pain in right knee: Secondary | ICD-10-CM

## 2019-05-12 NOTE — Telephone Encounter (Signed)
I called and spoke with the patient and informed her that she does have arthritis in her knee and she agreed to do a referral for orthopedic.  Lori Popowski,cma

## 2019-05-25 DIAGNOSIS — M1711 Unilateral primary osteoarthritis, right knee: Secondary | ICD-10-CM | POA: Diagnosis not present

## 2019-05-25 DIAGNOSIS — M25561 Pain in right knee: Secondary | ICD-10-CM | POA: Diagnosis not present

## 2019-06-01 ENCOUNTER — Other Ambulatory Visit: Payer: Self-pay | Admitting: Obstetrics and Gynecology

## 2019-06-01 DIAGNOSIS — D649 Anemia, unspecified: Secondary | ICD-10-CM | POA: Diagnosis not present

## 2019-06-01 DIAGNOSIS — Z86718 Personal history of other venous thrombosis and embolism: Secondary | ICD-10-CM | POA: Diagnosis not present

## 2019-06-01 DIAGNOSIS — N920 Excessive and frequent menstruation with regular cycle: Secondary | ICD-10-CM

## 2019-06-06 ENCOUNTER — Ambulatory Visit
Admission: RE | Admit: 2019-06-06 | Discharge: 2019-06-06 | Disposition: A | Discharge status: Home / Self Care | Payer: BC Managed Care – PPO | Source: Ambulatory Visit | Attending: Obstetrics and Gynecology | Admitting: Obstetrics and Gynecology

## 2019-06-06 DIAGNOSIS — D649 Anemia, unspecified: Secondary | ICD-10-CM

## 2019-06-06 DIAGNOSIS — N920 Excessive and frequent menstruation with regular cycle: Secondary | ICD-10-CM

## 2019-06-16 DIAGNOSIS — R300 Dysuria: Secondary | ICD-10-CM | POA: Diagnosis not present

## 2019-06-16 DIAGNOSIS — N920 Excessive and frequent menstruation with regular cycle: Secondary | ICD-10-CM | POA: Diagnosis not present

## 2019-07-06 ENCOUNTER — Other Ambulatory Visit: Payer: Self-pay | Admitting: Internal Medicine

## 2019-07-06 DIAGNOSIS — G4700 Insomnia, unspecified: Secondary | ICD-10-CM

## 2019-07-06 MED ORDER — ZOLPIDEM TARTRATE 5 MG PO TABS: 5.0000 mg | ORAL_TABLET | Freq: Every evening | ORAL | 2 refills | Status: DC | PRN

## 2019-07-11 ENCOUNTER — Ambulatory Visit (INDEPENDENT_AMBULATORY_CARE_PROVIDER_SITE_OTHER): Payer: BC Managed Care – PPO | Admitting: Internal Medicine

## 2019-07-11 ENCOUNTER — Other Ambulatory Visit: Payer: Self-pay

## 2019-07-11 ENCOUNTER — Encounter: Payer: Self-pay | Admitting: Internal Medicine

## 2019-07-11 VITALS — Ht 70.0 in | Wt 370.0 lb

## 2019-07-11 DIAGNOSIS — F419 Anxiety disorder, unspecified: Secondary | ICD-10-CM

## 2019-07-11 DIAGNOSIS — Z0000 Encounter for general adult medical examination without abnormal findings: Secondary | ICD-10-CM

## 2019-07-11 DIAGNOSIS — D219 Benign neoplasm of connective and other soft tissue, unspecified: Secondary | ICD-10-CM

## 2019-07-11 DIAGNOSIS — I10 Essential (primary) hypertension: Secondary | ICD-10-CM

## 2019-07-11 DIAGNOSIS — Z1231 Encounter for screening mammogram for malignant neoplasm of breast: Secondary | ICD-10-CM

## 2019-07-11 DIAGNOSIS — E559 Vitamin D deficiency, unspecified: Secondary | ICD-10-CM

## 2019-07-11 DIAGNOSIS — F32A Depression, unspecified: Secondary | ICD-10-CM

## 2019-07-11 DIAGNOSIS — E538 Deficiency of other specified B group vitamins: Secondary | ICD-10-CM

## 2019-07-11 DIAGNOSIS — F329 Major depressive disorder, single episode, unspecified: Secondary | ICD-10-CM

## 2019-07-11 DIAGNOSIS — D509 Iron deficiency anemia, unspecified: Secondary | ICD-10-CM

## 2019-07-11 DIAGNOSIS — Z9884 Bariatric surgery status: Secondary | ICD-10-CM

## 2019-07-11 MED ORDER — VENLAFAXINE HCL ER 150 MG PO CP24: ORAL_CAPSULE | ORAL | 3 refills | Status: DC

## 2019-07-11 MED ORDER — PHENTERMINE HCL 37.5 MG PO TABS: 37.5000 mg | ORAL_TABLET | Freq: Every day | ORAL | 0 refills | Status: DC

## 2019-07-11 MED ORDER — VENLAFAXINE HCL ER 75 MG PO CP24: ORAL_CAPSULE | ORAL | 3 refills | Status: DC

## 2019-07-11 NOTE — Progress Notes (Addendum)
Virtual Visit via Video Note  I connected with Alyssa Cain  on 07/11/19 at  8:15 AM EDT by a video enabled telemedicine application and verified that I am speaking with the correct person using two identifiers.  Location patient: home Location provider:work or home office Persons participating in the virtual visit: patient, provider  I discussed the limitations of evaluation and management by telemedicine and the availability of in person appointments. The patient expressed understanding and agreed to proceed.   HPI: 1. HTN BP 129/74 04/20/19 not checking on hctz 25 mg qd  2. Iron def with heavy menses and fibroids appt with [facility] 07/12/19 for hysterectomy and appt with hematology 08/10/19. She saw Dr. Lance Morin ob/gyn who referred her to [facility]  3. Obesity morbid she is not exercising and only took 2 months of adipex and did not pick up 2nd Rx s/p gastric bypass considering revision and disc Dr. Kreg Shropshire today wt is 370 lbs today was 380s but has lost  4. Anxiety/depression stable on effexor 150 mg xr and mom's health is stressor she wants to increase effexor reviewed side effects today. She does not have lack of interest just lack of time to do what she needs to do while caring for her mom  5. annual   ROS: See pertinent positives and negatives per HPI. General: wt stable HEENT: no sore throat  CV: no chest pain  Lungs: no sob  GI: no ab pain  MSK: no jt pain  Skin: no issues  Neuro: no h/a  Psych: +depression/anxiety GU: heavy menses and fibroids+  Past Medical History:  Diagnosis Date  . Anemia   . Anxiety   . Depression   . Hypertension   . Iron deficiency anemia   . Right leg DVT [facility])     Past Surgical History:  Procedure Laterality Date  . GASTRIC BYPASS     2007    Family History  Problem Relation Age of Onset  . Hypertension Mother   . Cancer Mother        brain  . Hyperlipidemia Father   . Diabetes Maternal Grandmother     SOCIAL HX:  married with kids    Current Outpatient Medications:  .  Cholecalciferol 1.25 MG (50000 UT) capsule, Take 1 capsule (50,000 Units total) by mouth once a week., Disp: 13 capsule, Rfl: 1 .  hydrochlorothiazide (HYDRODIURIL) 12.5 MG tablet, Take 1 tablet (12.5 mg total) by mouth daily., Disp: 90 tablet, Rfl: 3 .  phentermine (ADIPEX-P) 37.5 MG tablet, Take 1 tablet (37.5 mg total) by mouth daily before breakfast. Rx 2/2, Disp: 60 tablet, Rfl: 0 .  venlafaxine XR (EFFEXOR XR) 75 MG 24 hr capsule, 225 mg qd ([id number]), Disp: 90 capsule, Rfl: 3 .  venlafaxine XR (EFFEXOR-XR) 150 MG 24 hr capsule, 225 qd (150 + 75), Disp: 90 capsule, Rfl: 3 .  zolpidem (AMBIEN) 5 MG tablet, Take 1 tablet (5 mg total) by mouth at bedtime as needed for sleep., Disp: 30 tablet, Rfl: 2  EXAM:  VITALS per patient if applicable:  GENERAL: alert, oriented, appears well and in no acute distress  HEENT: atraumatic, conjunttiva clear, no obvious abnormalities on inspection of external nose and ears  NECK: normal movements of the head and neck  LUNGS: on inspection no signs of respiratory distress, breathing rate appears normal, no obvious gross SOB, gasping or wheezing  CV: no obvious cyanosis  MS: moves all visible extremities without noticeable abnormality  PSYCH/NEURO: pleasant  and cooperative, no obvious depression or anxiety, speech and thought processing grossly intact  ASSESSMENT AND PLAN:  Discussed the following assessment and plan:  Annual physical exam Declines flu shot of not she had flu 2 weeks ago entire family. Last flu shot in 2018  -she may get in 2020   Tdap thinks had in 2018  LMP 12/09/2018, Pap 2018 OB/GYN in [place] get records Pap negative 10/11/17 neg pap neg HPV mammo referred GIB center in [place] pt to call and schedule   Former PCP Dr. Kathe Mariner [facility]  Get records [facility] [place] seen 01/17/18 DVT  right lower ext 11/13/2016 s/p Eliquis x 2 months and iron def anemia hbg 6.4, 7.6  -h/o low Hbg A2 1.6 range 1.8-3.2 may indicate alpha thal or iron def anemia  H/o elevated lfts AST 42 range was 14-36  Anxiety and depression - Plan: venlafaxine XR (EFFEXOR-XR) 150 MG 24 hr capsule, venlafaxine XR (EFFEXOR XR) 75 MG 24 hr capsule total 225 qd  Denies therapy   Morbid obesity ([place]) - Plan: phentermine (ADIPEX-P) 37.5 MG tablet Considering revision surgery Dr. Kreg Shropshire disc wt loss clinic as well and rec healthy diet and exercise   Fibroid with heavy menses [facility] 07/12/19 appt considering hysterectomy saw Dr. Lance Morin who referred her   Essential hypertension -cont hctz 25 mg qd  -monitor BP  Iron def anemia, B12 def and vitamin D def s/p gastric bypass  Check labs cmet, iron, b12 and vitamin D with labs with h/o upcoming   11/22/19 post op exam TLH, BS, cystoscopy overall doing well no pap indicated in future [facility] gyn surgery in [facility] [place]    I discussed the assessment and treatment plan with the patient. The patient was provided an opportunity to ask questions and all were answered. The patient agreed with the plan and demonstrated an understanding of the instructions.   The patient was advised to call back or seek an in-person evaluation if the symptoms worsen or if the condition fails to improve as anticipated.   Nino Glow McLean-Scocuzza, MD

## 2019-07-12 ENCOUNTER — Other Ambulatory Visit: Payer: Self-pay | Admitting: *Deleted

## 2019-07-12 DIAGNOSIS — D219 Benign neoplasm of connective and other soft tissue, unspecified: Secondary | ICD-10-CM | POA: Diagnosis not present

## 2019-07-12 DIAGNOSIS — N939 Abnormal uterine and vaginal bleeding, unspecified: Secondary | ICD-10-CM | POA: Diagnosis not present

## 2019-07-12 DIAGNOSIS — D509 Iron deficiency anemia, unspecified: Secondary | ICD-10-CM

## 2019-07-12 DIAGNOSIS — Z1321 Encounter for screening for nutritional disorder: Secondary | ICD-10-CM

## 2019-07-12 DIAGNOSIS — D500 Iron deficiency anemia secondary to blood loss (chronic): Secondary | ICD-10-CM | POA: Diagnosis not present

## 2019-07-12 DIAGNOSIS — E538 Deficiency of other specified B group vitamins: Secondary | ICD-10-CM

## 2019-07-12 DIAGNOSIS — Z13820 Encounter for screening for osteoporosis: Secondary | ICD-10-CM

## 2019-07-12 DIAGNOSIS — Z86718 Personal history of other venous thrombosis and embolism: Secondary | ICD-10-CM | POA: Diagnosis not present

## 2019-07-12 NOTE — Progress Notes (Signed)
Labs added per md order to be drawn in cancer center

## 2019-08-08 DIAGNOSIS — Z9884 Bariatric surgery status: Secondary | ICD-10-CM | POA: Diagnosis not present

## 2019-08-08 DIAGNOSIS — Z86718 Personal history of other venous thrombosis and embolism: Secondary | ICD-10-CM | POA: Diagnosis not present

## 2019-08-08 DIAGNOSIS — D500 Iron deficiency anemia secondary to blood loss (chronic): Secondary | ICD-10-CM | POA: Diagnosis not present

## 2019-08-08 DIAGNOSIS — Z79899 Other long term (current) drug therapy: Secondary | ICD-10-CM | POA: Diagnosis not present

## 2019-08-08 DIAGNOSIS — N946 Dysmenorrhea, unspecified: Secondary | ICD-10-CM | POA: Diagnosis not present

## 2019-08-08 DIAGNOSIS — D259 Leiomyoma of uterus, unspecified: Secondary | ICD-10-CM | POA: Diagnosis not present

## 2019-08-08 DIAGNOSIS — N939 Abnormal uterine and vaginal bleeding, unspecified: Secondary | ICD-10-CM | POA: Diagnosis not present

## 2019-08-08 DIAGNOSIS — I10 Essential (primary) hypertension: Secondary | ICD-10-CM | POA: Diagnosis not present

## 2019-08-08 DIAGNOSIS — D219 Benign neoplasm of connective and other soft tissue, unspecified: Secondary | ICD-10-CM | POA: Diagnosis not present

## 2019-08-09 ENCOUNTER — Other Ambulatory Visit: Payer: Self-pay

## 2019-08-09 ENCOUNTER — Encounter: Payer: Self-pay | Admitting: Internal Medicine

## 2019-08-09 NOTE — Progress Notes (Signed)
Patient here today for follow up.  Patient denies any nausea, vomiting, diarrhea, constipation, decrease in appetite,SOB, fatigue or pain.

## 2019-08-10 ENCOUNTER — Inpatient Hospital Stay: Payer: BC Managed Care – PPO

## 2019-08-10 ENCOUNTER — Inpatient Hospital Stay: Payer: BC Managed Care – PPO | Admitting: Internal Medicine

## 2019-08-10 ENCOUNTER — Inpatient Hospital Stay: Payer: BC Managed Care – PPO | Attending: Internal Medicine

## 2019-08-10 ENCOUNTER — Other Ambulatory Visit: Payer: Self-pay

## 2019-08-10 DIAGNOSIS — R5383 Other fatigue: Secondary | ICD-10-CM | POA: Diagnosis not present

## 2019-08-10 DIAGNOSIS — Z13820 Encounter for screening for osteoporosis: Secondary | ICD-10-CM

## 2019-08-10 DIAGNOSIS — Z8249 Family history of ischemic heart disease and other diseases of the circulatory system: Secondary | ICD-10-CM | POA: Insufficient documentation

## 2019-08-10 DIAGNOSIS — E538 Deficiency of other specified B group vitamins: Secondary | ICD-10-CM | POA: Diagnosis not present

## 2019-08-10 DIAGNOSIS — D500 Iron deficiency anemia secondary to blood loss (chronic): Secondary | ICD-10-CM | POA: Insufficient documentation

## 2019-08-10 DIAGNOSIS — Z79899 Other long term (current) drug therapy: Secondary | ICD-10-CM | POA: Insufficient documentation

## 2019-08-10 DIAGNOSIS — D509 Iron deficiency anemia, unspecified: Secondary | ICD-10-CM

## 2019-08-10 DIAGNOSIS — Z7901 Long term (current) use of anticoagulants: Secondary | ICD-10-CM | POA: Diagnosis not present

## 2019-08-10 DIAGNOSIS — I10 Essential (primary) hypertension: Secondary | ICD-10-CM | POA: Insufficient documentation

## 2019-08-10 DIAGNOSIS — Z86718 Personal history of other venous thrombosis and embolism: Secondary | ICD-10-CM | POA: Insufficient documentation

## 2019-08-10 DIAGNOSIS — K909 Intestinal malabsorption, unspecified: Secondary | ICD-10-CM

## 2019-08-10 DIAGNOSIS — Z9884 Bariatric surgery status: Secondary | ICD-10-CM | POA: Diagnosis not present

## 2019-08-10 DIAGNOSIS — F419 Anxiety disorder, unspecified: Secondary | ICD-10-CM | POA: Diagnosis not present

## 2019-08-10 DIAGNOSIS — Z1321 Encounter for screening for nutritional disorder: Secondary | ICD-10-CM

## 2019-08-10 LAB — CBC WITH DIFFERENTIAL/PLATELET
Abs Immature Granulocytes: 0.02 10*3/uL (ref 0.00–0.07)
Basophils Absolute: 0 10*3/uL (ref 0.0–0.1)
Basophils Relative: 1 %
Eosinophils Absolute: 0.1 10*3/uL (ref 0.0–0.5)
Eosinophils Relative: 1 %
HCT: 40.4 % (ref 36.0–46.0)
Hemoglobin: 12.7 g/dL (ref 12.0–15.0)
Immature Granulocytes: 0 %
Lymphocytes Relative: 36 %
Lymphs Abs: 2.2 10*3/uL (ref 0.7–4.0)
MCH: 29.2 pg (ref 26.0–34.0)
MCHC: 31.4 g/dL (ref 30.0–36.0)
MCV: 92.9 fL (ref 80.0–100.0)
Monocytes Absolute: 0.3 10*3/uL (ref 0.1–1.0)
Monocytes Relative: 5 %
Neutro Abs: 3.5 10*3/uL (ref 1.7–7.7)
Neutrophils Relative %: 57 %
Platelets: 297 10*3/uL (ref 150–400)
RBC: 4.35 MIL/uL (ref 3.87–5.11)
RDW: 12.9 % (ref 11.5–15.5)
WBC: 6.1 10*3/uL (ref 4.0–10.5)
nRBC: 0 % (ref 0.0–0.2)

## 2019-08-10 LAB — LACTATE DEHYDROGENASE: LDH: 135 U/L (ref 98–192)

## 2019-08-10 LAB — COMPREHENSIVE METABOLIC PANEL
ALT: 26 U/L (ref 0–44)
AST: 28 U/L (ref 15–41)
Albumin: 3.7 g/dL (ref 3.5–5.0)
Alkaline Phosphatase: 94 U/L (ref 38–126)
Anion gap: 9 (ref 5–15)
BUN: 10 mg/dL (ref 6–20)
CO2: 26 mmol/L (ref 22–32)
Calcium: 8.6 mg/dL — ABNORMAL LOW (ref 8.9–10.3)
Chloride: 104 mmol/L (ref 98–111)
Creatinine, Ser: 0.8 mg/dL (ref 0.44–1.00)
GFR calc Af Amer: >60 mL/min (ref >60)
GFR calc non Af Amer: >60 mL/min (ref >60)
Glucose, Bld: 113 mg/dL — ABNORMAL HIGH (ref 70–99)
Potassium: 3.5 mmol/L (ref 3.5–5.1)
Sodium: 139 mmol/L (ref 135–145)
Total Bilirubin: 0.5 mg/dL (ref 0.3–1.2)
Total Protein: 7.2 g/dL (ref 6.5–8.1)

## 2019-08-10 LAB — IRON AND TIBC
Iron: 86 ug/dL (ref 28–170)
Saturation Ratios: 30 % (ref 10.4–31.8)
TIBC: 287 ug/dL (ref 250–450)
UIBC: 201 ug/dL

## 2019-08-10 LAB — FERRITIN: Ferritin: 431 ng/mL — ABNORMAL HIGH (ref 11–307)

## 2019-08-10 LAB — VITAMIN B12: Vitamin B-12: 228 pg/mL (ref 180–914)

## 2019-08-10 MED ORDER — CYANOCOBALAMIN 1000 MCG/ML IJ SOLN
1000.0000 ug | Freq: Once | INTRAMUSCULAR | Status: AC
  Administered 2019-08-10: 1000 ug via INTRAMUSCULAR
  Filled 2019-08-10: qty 1

## 2019-08-10 MED ORDER — ENOXAPARIN SODIUM 40 MG/0.4ML ~~LOC~~ SOLN: 40.0000 mg | SUBCUTANEOUS | 0 refills | Status: DC

## 2019-08-10 NOTE — Progress Notes (Signed)
[place] NOTE  Patient Care Team: McLean-Scocuzza, Nino Glow, MD as PCP - General (Internal Medicine)  CHIEF COMPLAINTS/PURPOSE OF CONSULTATION:    HEMATOLOGY HISTORY  # IRON DEF ANEMIA- sec to gastric bypass [pcp- ferritin6/Hb-8 MCV 68; B12 -low; vit D-low]  # ? 2018 [[place]]Hx of right Calf x Eliquis 3 months  # gastric bypass [2007; [place]]   HISTORY OF PRESENTING ILLNESS:  Alyssa Cain 43 y.o.  female with a history of iron deficiency anemia secondary to gastric bypass is here for follow-up/proceed with IV iron infusions.  Patient in the interim has been evaluated by gynecology at [facility] is awaiting to have surgery/hysterectomy.  Patient had prior history of DVT calf; needed to be on Eliquis for 3 months.  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  Otherwise no family history no prior history/not on birth control pills at that time.  At this time her energy levels are adequate; except for mild fatigue.  Review of Systems  Constitutional: Positive for malaise/fatigue. Negative for chills, diaphoresis, fever and weight loss.  HENT: Negative for nosebleeds and sore throat.   Eyes: Negative for double vision.  Respiratory: Negative for cough, hemoptysis, sputum production, shortness of breath and wheezing.   Cardiovascular: Negative for chest pain, palpitations, orthopnea and leg swelling.  Gastrointestinal: Negative for abdominal pain, blood in stool, constipation, diarrhea, heartburn, melena, nausea and vomiting.  Genitourinary: Negative for dysuria, frequency and urgency.  Musculoskeletal: Negative for back pain and joint pain.  Skin: Negative.  Negative for itching and rash.  Neurological: Negative for dizziness, focal weakness, weakness and headaches.  Endo/Heme/Allergies: Does not bruise/bleed easily.  Psychiatric/Behavioral: Negative for depression. The patient is not nervous/anxious.     MEDICAL HISTORY:  Past Medical  History:  Diagnosis Date  . Anemia   . Anxiety   . Depression   . Hypertension   . Iron deficiency anemia   . Right leg DVT ([place])     SURGICAL HISTORY: Past Surgical History:  Procedure Laterality Date  . GASTRIC BYPASS     2007    SOCIAL HISTORY: Social History   Socioeconomic History  . Marital status: Married    Spouse name: Not on file  . Number of children: Not on file  . Years of education: Not on file  . Highest education level: Not on file  Occupational History  . Not on file  Social Needs  . Financial resource strain: Not on file  . Food insecurity    Worry: Not on file    Inability: Not on file  . Transportation needs    Medical: Not on file    Non-medical: Not on file  Tobacco Use  . Smoking status: Never Smoker  . Smokeless tobacco: Never Used  Substance and Sexual Activity  . Alcohol use: Not Currently  . Drug use: Not Currently  . Sexual activity: Yes    Comment: men  Lifestyle  . Physical activity    Days per week: Not on file    Minutes per session: Not on file  . Stress: Not on file  Relationships  . Social Herbalist on phone: Not on file    Gets together: Not on file    Attends religious service: Not on file    Active member of club or organization: Not on file    Attends meetings of clubs or organizations: Not on file    Relationship status: Not on file  .  Intimate partner violence    Fear of current or ex partner: Not on file    Emotionally abused: Not on file    Physically abused: Not on file    Forced sexual activity: Not on file  Other Topics Concern  . Not on file  Social History Narrative   Married Cytogeneticist    2 kids 53 and 53 y.o as of 12/20/2018    Moved from [place] to [place] to [place]    Owns guns, wears seat belt, safe in relationship    3 pregnancies 2 live births       Work at [employer] in [employer].     FAMILY HISTORY: Family History  Problem Relation Age of Onset  . Hypertension Mother   . Cancer Mother         brain  . Hyperlipidemia Father   . Diabetes Maternal Grandmother     ALLERGIES:  is allergic to trazodone and nefazodone.  MEDICATIONS:  Current Outpatient Medications  Medication Sig Dispense Refill  . Cholecalciferol 1.25 MG (50000 UT) capsule Take 1 capsule (50,000 Units total) by mouth once a week. 13 capsule 1  . hydrochlorothiazide (HYDRODIURIL) 12.5 MG tablet Take 1 tablet (12.5 mg total) by mouth daily. 90 tablet 3  . phentermine (ADIPEX-P) 37.5 MG tablet Take 1 tablet (37.5 mg total) by mouth daily before breakfast. Rx 2/2 60 tablet 0  . venlafaxine XR (EFFEXOR XR) 75 MG 24 hr capsule 225 mg qd (150+75) 90 capsule 3  . venlafaxine XR (EFFEXOR-XR) 150 MG 24 hr capsule 225 qd (150 + 75) 90 capsule 3  . zolpidem (AMBIEN) 5 MG tablet Take 1 tablet (5 mg total) by mouth at bedtime as needed for sleep. 30 tablet 2  . enoxaparin (LOVENOX) 40 MG/0.4ML injection Inject 0.4 mLs (40 mg total) into the skin daily. 5.6 mL 0   No current facility-administered medications for this visit.       PHYSICAL EXAMINATION:   Vitals:   08/10/19 1116  BP: (!) 164/102  Pulse: 70  Temp: (!) 97.3 F (36.3 C)   Filed Weights   08/10/19 1116  Weight: (!) 379 lb (171.9 kg)    Physical Exam  Constitutional: She is oriented to person, place, and time and well-developed, well-nourished, and in no distress.  HENT:  Head: Normocephalic and atraumatic.  Mouth/Throat: Oropharynx is clear and moist. No oropharyngeal exudate.  Eyes: Pupils are equal, round, and reactive to light.  Neck: Normal range of motion. Neck supple.  Cardiovascular: Normal rate and regular rhythm.  Pulmonary/Chest: No respiratory distress. She has no wheezes.  Abdominal: Soft. Bowel sounds are normal. She exhibits no distension and no mass. There is no abdominal tenderness. There is no rebound and no guarding.  Musculoskeletal: Normal range of motion.        General: No tenderness or edema.  Neurological: She is alert  and oriented to person, place, and time.  Skin: Skin is warm.  Psychiatric: Affect normal.    LABORATORY DATA:  I have reviewed the data as listed Lab Results  Component Value Date   WBC 6.1 08/10/2019   HGB 12.7 08/10/2019   HCT 40.4 08/10/2019   MCV 92.9 08/10/2019   PLT 297 08/10/2019   Recent Labs    12/23/18 1028 02/03/19 1337 08/10/19 1038  NA 137 136 139  K 3.7 4.1 3.5  CL 103 106 104  CO2 26 24 26   GLUCOSE 75 85 113*  BUN 9 10 10   CREATININE  0.77 0.78 0.80  CALCIUM 7.8* 8.6* 8.6*  GFRNONAA  --  >60 >60  GFRAA  --  >60 >60  PROT 7.3 7.6 7.2  ALBUMIN 3.9 3.8 3.7  AST 19 38 28  ALT 8 31 26   ALKPHOS 135* 108 94  BILITOT 0.4 0.5 0.5     No results found.  Malabsorption of iron # Iron def anemia sec to iron malabsorption-/gastric bypass-menstrual blood loss. S/p IV Ferrahem.   #Today hemoglobin is 12.7 saturation is 30%.  Hold IV iron.  Discussed will more likely need maintenance IV iron for the rest of the life.  The frequency to be determined based upon her labs.  # B12 deficiency gastric bypass- Take b12 1077mcg sublingual tablet [under the tongue] once a day.  # Menorrhagia chronic -awaiting hysterectomy through [facility] in October.  #History of DVT/calf-unprovoked.  Patient's ongoing risk factors include : Obesity/prior history of DVT.  Given the upcoming surgery I would recommend prophylactic dose of Lovenox 40 mg subcu every day 2 weeks post surgery.  Standard pre-operative/prophylactic Lovenox is reasonable.  Will fax over our recommendations to surgeon's office.  Prescription for Lovenox was given.  # DISPOSITION:  # B12 injection today; NO Iron infusion # follow up in 4 months/cbc/-MD/labs- cbc/bmp-possible Ferrahem/B12 injection- dr.B  All questions were answered. The patient knows to call the clinic with any problems, questions or concerns.    Cammie Sickle, MD 08/15/2019 3:11 PM

## 2019-08-10 NOTE — Patient Instructions (Signed)
#   Take Lovenox 40 mg SQ every day x 2 weeks after surgery.   # Take b12 1051mcg sublingual tablet [under the tongue] once a day.

## 2019-08-10 NOTE — Progress Notes (Signed)
Per Jerene Pitch CMA per Dr. Rogue Bussing no feraheme today, B12 injection only.

## 2019-08-10 NOTE — Assessment & Plan Note (Addendum)
#   Iron def anemia sec to iron malabsorption-/gastric bypass-menstrual blood loss. S/p IV Ferrahem.   #Today hemoglobin is 12.7 saturation is 30%.  Hold IV iron.  Discussed will more likely need maintenance IV iron for the rest of the life.  The frequency to be determined based upon her labs.  # B12 deficiency gastric bypass- Take b12 1057mcg sublingual tablet [under the tongue] once a day.  # Menorrhagia chronic -awaiting hysterectomy through [facility] in October.  #History of DVT/calf-unprovoked.  Patient's ongoing risk factors include : Obesity/prior history of DVT.  Given the upcoming surgery I would recommend prophylactic dose of Lovenox 40 mg subcu every day 2 weeks post surgery.  Standard pre-operative/prophylactic Lovenox is reasonable.  Will fax over our recommendations to surgeon's office.  Prescription for Lovenox was given.  # DISPOSITION:  # B12 injection today; NO Iron infusion # follow up in 4 months/cbc/-MD/labs- cbc/bmp-possible Ferrahem/B12 injection- dr.B

## 2019-08-11 LAB — VITAMIN D 25 HYDROXY (VIT D DEFICIENCY, FRACTURES): Vit D, 25-Hydroxy: 36.4 ng/mL (ref 30.0–100.0)

## 2019-08-11 NOTE — Progress Notes (Signed)
Hello-I wanted to inform you that-vitamin D levels are normal/B12 also normal; but on the lower side.  Recommend B12 sublingual as discussed.  And I will send recommendations to your gynecologist/surgeon at [facility] regarding blood thinners/Lovenox after surgery  No new recommendations/follow-up as planned/call if any questions.  Thank you/Dr. B/MyChart message

## 2019-08-15 ENCOUNTER — Telehealth: Payer: Self-pay | Admitting: Internal Medicine

## 2019-08-15 NOTE — Telephone Encounter (Signed)
Patient was informed.  Patient understood and no questions, comments, or concerns at this time.  

## 2019-08-15 NOTE — Telephone Encounter (Signed)
rec pt take D3 4000 IU daily otc  Can stop weekly D3   [place]

## 2019-08-17 DIAGNOSIS — N939 Abnormal uterine and vaginal bleeding, unspecified: Secondary | ICD-10-CM | POA: Diagnosis not present

## 2019-08-17 DIAGNOSIS — I10 Essential (primary) hypertension: Secondary | ICD-10-CM | POA: Diagnosis not present

## 2019-08-29 DIAGNOSIS — N939 Abnormal uterine and vaginal bleeding, unspecified: Secondary | ICD-10-CM | POA: Diagnosis not present

## 2019-09-02 DIAGNOSIS — Z01812 Encounter for preprocedural laboratory examination: Secondary | ICD-10-CM | POA: Diagnosis not present

## 2019-09-02 DIAGNOSIS — Z20828 Contact with and (suspected) exposure to other viral communicable diseases: Secondary | ICD-10-CM | POA: Diagnosis not present

## 2019-09-04 DIAGNOSIS — Z86718 Personal history of other venous thrombosis and embolism: Secondary | ICD-10-CM | POA: Diagnosis not present

## 2019-09-04 DIAGNOSIS — N939 Abnormal uterine and vaginal bleeding, unspecified: Secondary | ICD-10-CM | POA: Diagnosis not present

## 2019-09-04 DIAGNOSIS — Z6841 Body Mass Index (BMI) 40.0 and over, adult: Secondary | ICD-10-CM | POA: Diagnosis not present

## 2019-09-04 DIAGNOSIS — N838 Other noninflammatory disorders of ovary, fallopian tube and broad ligament: Secondary | ICD-10-CM | POA: Diagnosis not present

## 2019-09-04 DIAGNOSIS — I10 Essential (primary) hypertension: Secondary | ICD-10-CM | POA: Diagnosis not present

## 2019-09-04 DIAGNOSIS — D219 Benign neoplasm of connective and other soft tissue, unspecified: Secondary | ICD-10-CM | POA: Diagnosis not present

## 2019-09-04 DIAGNOSIS — Z9884 Bariatric surgery status: Secondary | ICD-10-CM | POA: Diagnosis not present

## 2019-09-04 DIAGNOSIS — D259 Leiomyoma of uterus, unspecified: Secondary | ICD-10-CM | POA: Diagnosis not present

## 2019-09-05 MED ORDER — HYDROMORPHONE HCL 1 MG/ML IJ SOLN
0.20 | INTRAMUSCULAR | Status: DC
Start: ? — End: 2019-09-05

## 2019-09-05 MED ORDER — OXYCODONE HCL 5 MG PO TABS
10.00 | ORAL_TABLET | ORAL | Status: DC
Start: ? — End: 2019-09-05

## 2019-09-05 MED ORDER — LACTATED RINGERS IV SOLN
10.00 | INTRAVENOUS | Status: DC
Start: ? — End: 2019-09-05

## 2019-09-05 MED ORDER — PROMETHAZINE HCL 25 MG/ML IJ SOLN
12.50 | INTRAMUSCULAR | Status: DC
Start: ? — End: 2019-09-05

## 2019-09-05 MED ORDER — FENTANYL CITRATE (PF) 50 MCG/ML IJ SOLN
25.00 | INTRAMUSCULAR | Status: DC
Start: ? — End: 2019-09-05

## 2019-09-13 ENCOUNTER — Other Ambulatory Visit: Payer: Self-pay | Admitting: Sports Medicine

## 2019-09-13 ENCOUNTER — Other Ambulatory Visit: Payer: Self-pay | Admitting: Internal Medicine

## 2019-09-13 ENCOUNTER — Ambulatory Visit
Admission: RE | Admit: 2019-09-13 | Discharge: 2019-09-13 | Disposition: A | Discharge status: Home / Self Care | Payer: BC Managed Care – PPO | Source: Ambulatory Visit | Attending: Internal Medicine | Admitting: Internal Medicine

## 2019-09-13 ENCOUNTER — Other Ambulatory Visit: Payer: Self-pay

## 2019-09-13 DIAGNOSIS — M25561 Pain in right knee: Secondary | ICD-10-CM | POA: Diagnosis not present

## 2019-09-13 DIAGNOSIS — G8929 Other chronic pain: Secondary | ICD-10-CM | POA: Diagnosis not present

## 2019-09-13 DIAGNOSIS — M25461 Effusion, right knee: Secondary | ICD-10-CM | POA: Diagnosis not present

## 2019-09-13 DIAGNOSIS — M1711 Unilateral primary osteoarthritis, right knee: Secondary | ICD-10-CM

## 2019-09-13 DIAGNOSIS — Z1231 Encounter for screening mammogram for malignant neoplasm of breast: Secondary | ICD-10-CM

## 2019-09-27 ENCOUNTER — Other Ambulatory Visit: Payer: Self-pay | Admitting: Internal Medicine

## 2019-09-27 DIAGNOSIS — G4700 Insomnia, unspecified: Secondary | ICD-10-CM

## 2019-09-27 MED ORDER — ZOLPIDEM TARTRATE 5 MG PO TABS: 5.0000 mg | ORAL_TABLET | Freq: Every evening | ORAL | 2 refills | Status: DC | PRN

## 2019-09-30 ENCOUNTER — Ambulatory Visit
Admission: RE | Admit: 2019-09-30 | Discharge: 2019-09-30 | Disposition: A | Discharge status: Home / Self Care | Payer: BC Managed Care – PPO | Source: Ambulatory Visit | Attending: Internal Medicine | Admitting: Internal Medicine

## 2019-09-30 ENCOUNTER — Other Ambulatory Visit: Payer: Self-pay

## 2019-09-30 DIAGNOSIS — M1711 Unilateral primary osteoarthritis, right knee: Secondary | ICD-10-CM

## 2019-09-30 DIAGNOSIS — M25561 Pain in right knee: Secondary | ICD-10-CM | POA: Diagnosis not present

## 2019-10-09 DIAGNOSIS — M25561 Pain in right knee: Secondary | ICD-10-CM | POA: Diagnosis not present

## 2019-10-09 DIAGNOSIS — M1711 Unilateral primary osteoarthritis, right knee: Secondary | ICD-10-CM | POA: Diagnosis not present

## 2019-10-09 DIAGNOSIS — M7121 Synovial cyst of popliteal space [Baker], right knee: Secondary | ICD-10-CM | POA: Diagnosis not present

## 2019-10-09 DIAGNOSIS — M25461 Effusion, right knee: Secondary | ICD-10-CM | POA: Diagnosis not present

## 2019-10-24 DIAGNOSIS — M1711 Unilateral primary osteoarthritis, right knee: Secondary | ICD-10-CM | POA: Diagnosis not present

## 2019-10-24 DIAGNOSIS — M25461 Effusion, right knee: Secondary | ICD-10-CM | POA: Diagnosis not present

## 2019-10-24 DIAGNOSIS — M25561 Pain in right knee: Secondary | ICD-10-CM | POA: Diagnosis not present

## 2019-10-24 DIAGNOSIS — G8929 Other chronic pain: Secondary | ICD-10-CM | POA: Diagnosis not present

## 2019-12-08 ENCOUNTER — Other Ambulatory Visit: Payer: Self-pay | Admitting: *Deleted

## 2019-12-08 DIAGNOSIS — E538 Deficiency of other specified B group vitamins: Secondary | ICD-10-CM

## 2019-12-08 DIAGNOSIS — K909 Intestinal malabsorption, unspecified: Secondary | ICD-10-CM

## 2019-12-11 ENCOUNTER — Other Ambulatory Visit: Payer: Self-pay

## 2019-12-11 ENCOUNTER — Encounter: Payer: Self-pay | Admitting: Internal Medicine

## 2019-12-11 ENCOUNTER — Ambulatory Visit: Payer: BC Managed Care – PPO

## 2019-12-11 ENCOUNTER — Inpatient Hospital Stay: Payer: BC Managed Care – PPO | Attending: Internal Medicine

## 2019-12-11 ENCOUNTER — Inpatient Hospital Stay: Payer: BC Managed Care – PPO

## 2019-12-11 ENCOUNTER — Inpatient Hospital Stay (HOSPITAL_BASED_OUTPATIENT_CLINIC_OR_DEPARTMENT_OTHER): Payer: BC Managed Care – PPO | Admitting: Internal Medicine

## 2019-12-11 DIAGNOSIS — Z86718 Personal history of other venous thrombosis and embolism: Secondary | ICD-10-CM | POA: Insufficient documentation

## 2019-12-11 DIAGNOSIS — Z8249 Family history of ischemic heart disease and other diseases of the circulatory system: Secondary | ICD-10-CM | POA: Diagnosis not present

## 2019-12-11 DIAGNOSIS — Z833 Family history of diabetes mellitus: Secondary | ICD-10-CM | POA: Insufficient documentation

## 2019-12-11 DIAGNOSIS — K909 Intestinal malabsorption, unspecified: Secondary | ICD-10-CM

## 2019-12-11 DIAGNOSIS — Z8349 Family history of other endocrine, nutritional and metabolic diseases: Secondary | ICD-10-CM | POA: Diagnosis not present

## 2019-12-11 DIAGNOSIS — Z9884 Bariatric surgery status: Secondary | ICD-10-CM | POA: Insufficient documentation

## 2019-12-11 DIAGNOSIS — Z79899 Other long term (current) drug therapy: Secondary | ICD-10-CM | POA: Insufficient documentation

## 2019-12-11 DIAGNOSIS — D500 Iron deficiency anemia secondary to blood loss (chronic): Secondary | ICD-10-CM | POA: Diagnosis not present

## 2019-12-11 DIAGNOSIS — I10 Essential (primary) hypertension: Secondary | ICD-10-CM | POA: Diagnosis not present

## 2019-12-11 DIAGNOSIS — E538 Deficiency of other specified B group vitamins: Secondary | ICD-10-CM

## 2019-12-11 LAB — CBC WITH DIFFERENTIAL/PLATELET
Abs Immature Granulocytes: 0.01 10*3/uL (ref 0.00–0.07)
Basophils Absolute: 0 10*3/uL (ref 0.0–0.1)
Basophils Relative: 1 %
Eosinophils Absolute: 0 10*3/uL (ref 0.0–0.5)
Eosinophils Relative: 1 %
HCT: 41.4 % (ref 36.0–46.0)
Hemoglobin: 12.7 g/dL (ref 12.0–15.0)
Immature Granulocytes: 0 %
Lymphocytes Relative: 39 %
Lymphs Abs: 2.4 10*3/uL (ref 0.7–4.0)
MCH: 28 pg (ref 26.0–34.0)
MCHC: 30.7 g/dL (ref 30.0–36.0)
MCV: 91.2 fL (ref 80.0–100.0)
Monocytes Absolute: 0.4 10*3/uL (ref 0.1–1.0)
Monocytes Relative: 7 %
Neutro Abs: 3.2 10*3/uL (ref 1.7–7.7)
Neutrophils Relative %: 52 %
Platelets: 296 10*3/uL (ref 150–400)
RBC: 4.54 MIL/uL (ref 3.87–5.11)
RDW: 13.2 % (ref 11.5–15.5)
WBC: 6 10*3/uL (ref 4.0–10.5)
nRBC: 0 % (ref 0.0–0.2)

## 2019-12-11 LAB — BASIC METABOLIC PANEL
Anion gap: 8 (ref 5–15)
BUN: 18 mg/dL (ref 6–20)
CO2: 25 mmol/L (ref 22–32)
Calcium: 8.4 mg/dL — ABNORMAL LOW (ref 8.9–10.3)
Chloride: 102 mmol/L (ref 98–111)
Creatinine, Ser: 1.02 mg/dL — ABNORMAL HIGH (ref 0.44–1.00)
GFR calc Af Amer: >60 mL/min (ref >60)
GFR calc non Af Amer: >60 mL/min (ref >60)
Glucose, Bld: 91 mg/dL (ref 70–99)
Potassium: 3.9 mmol/L (ref 3.5–5.1)
Sodium: 135 mmol/L (ref 135–145)

## 2019-12-11 MED ORDER — CYANOCOBALAMIN 1000 MCG/ML IJ SOLN
1000.0000 ug | Freq: Once | INTRAMUSCULAR | Status: AC
  Administered 2019-12-11: 1000 ug via INTRAMUSCULAR
  Filled 2019-12-11: qty 1

## 2019-12-11 NOTE — Assessment & Plan Note (Signed)
#   Iron def anemia sec to iron malabsorption-/gastric bypass-menstrual blood loss.  Hemoglobin is 12.7.  No iron studies today.  Patient is asymptomatic.  Hold off IV iron.  # B12 deficiency gastric bypass-patient not on B12 sublingual.  Proceed with B12 injection today.   # Menorrhagia chronic s/p TAH [facility]-postoperative no complications.  #History of DVT/calf-unprovoked s/p prophylactic Lovenox for the recent TAH uneventful.  # DISPOSITION:  # B12 injection today;  NO Ferrahem.  # follow up in 6 months- MD; cbc/bmp/iron studies/ferritin- -possible Ferrahem/B12 injection- dr.B

## 2019-12-11 NOTE — Progress Notes (Signed)
[facility] NOTE  Patient Care Team: McLean-Scocuzza, Nino Glow, MD as PCP - General (Internal Medicine)  CHIEF COMPLAINTS/PURPOSE OF CONSULTATION: Iron deficient anemia   HEMATOLOGY HISTORY  # IRON DEF ANEMIA- sec to gastric bypass [pcp- ferritin6/Hb-8 MCV 68; B12 -low; vit D-low]  # ? 2018 [[place]]Hx of right Calf x Eliquis 3 months  # gastric bypass [2007; [place]]   HISTORY OF PRESENTING ILLNESS:  Alyssa Cain 44 y.o.  female with a history of iron deficiency anemia secondary to gastric bypass status post IV iron infusion is here for follow-up.  In the interim patient underwent TAH-at [facility].  Postoperatively she was on prophylactic Lovenox.  No complications.  She is not taking B12 sublingual.  Otherwise denies any worsening fatigue.  Review of Systems  Constitutional: Negative for chills, diaphoresis, fever and weight loss.  HENT: Negative for nosebleeds and sore throat.   Eyes: Negative for double vision.  Respiratory: Negative for cough, hemoptysis, sputum production, shortness of breath and wheezing.   Cardiovascular: Negative for chest pain, palpitations, orthopnea and leg swelling.  Gastrointestinal: Negative for abdominal pain, blood in stool, constipation, diarrhea, heartburn, melena, nausea and vomiting.  Genitourinary: Negative for dysuria, frequency and urgency.  Musculoskeletal: Negative for back pain and joint pain.  Skin: Negative.  Negative for itching and rash.  Neurological: Negative for dizziness, focal weakness, weakness and headaches.  Endo/Heme/Allergies: Does not bruise/bleed easily.  Psychiatric/Behavioral: Negative for depression. The patient is not nervous/anxious.     MEDICAL HISTORY:  Past Medical History:  Diagnosis Date  . Anemia   . Anxiety   . Depression   . Fibroid   . Hypertension   . Iron deficiency anemia   . Right leg DVT ([place])     SURGICAL HISTORY: Past Surgical History:  Procedure Laterality  Date  . GASTRIC BYPASS     2007  . LAPAROSCOPIC TOTAL HYSTERECTOMY     removed cervix/uterus, removal of b/l tubes and ovary(s) fibroid 09/04/19 [facility] Dr. Delle Reining, Iona Coach MD; no need for future paps as of 11/22/19     SOCIAL HISTORY: Social History   Socioeconomic History  . Marital status: Married    Spouse name: Not on file  . Number of children: Not on file  . Years of education: Not on file  . Highest education level: Not on file  Occupational History  . Not on file  Tobacco Use  . Smoking status: Never Smoker  . Smokeless tobacco: Never Used  Substance and Sexual Activity  . Alcohol use: Not Currently  . Drug use: Not Currently  . Sexual activity: Yes    Comment: men  Other Topics Concern  . Not on file  Social History Narrative   Married Cytogeneticist    2 kids 50 and 62 y.o as of 12/20/2018    Moved from [place] to [place] to [place]    Owns guns, wears seat belt, safe in relationship    3 pregnancies 2 live births       Work at [employer] in [employer].    Social Determinants of Health   Financial Resource Strain:   . Difficulty of Paying Living Expenses: Not on file  Food Insecurity:   . Worried About Charity fundraiser in the Last Year: Not on file  . Ran Out of Food in the Last Year: Not on file  Transportation Needs:   . Lack of Transportation (Medical): Not on file  . Lack of Transportation (Non-Medical): Not  on file  Physical Activity:   . Days of Exercise per Week: Not on file  . Minutes of Exercise per Session: Not on file  Stress:   . Feeling of Stress : Not on file  Social Connections:   . Frequency of Communication with Friends and Family: Not on file  . Frequency of Social Gatherings with Friends and Family: Not on file  . Attends Religious Services: Not on file  . Active Member of Clubs or Organizations: Not on file  . Attends Archivist Meetings: Not on file  . Marital Status: Not on file  Intimate Partner Violence:   . Fear of  Current or Ex-Partner: Not on file  . Emotionally Abused: Not on file  . Physically Abused: Not on file  . Sexually Abused: Not on file    FAMILY HISTORY: Family History  Problem Relation Age of Onset  . Hypertension Mother   . Cancer Mother        brain  . Hyperlipidemia Father   . Diabetes Maternal Grandmother     ALLERGIES:  is allergic to trazodone and nefazodone.  MEDICATIONS:  Current Outpatient Medications  Medication Sig Dispense Refill  . hydrochlorothiazide (HYDRODIURIL) 12.5 MG tablet Take 1 tablet (12.5 mg total) by mouth daily. 90 tablet 3  . Cholecalciferol 1.25 MG (50000 UT) capsule Take 1 capsule (50,000 Units total) by mouth once a week. (Patient not taking: Reported on 12/11/2019) 13 capsule 1  . zolpidem (AMBIEN) 5 MG tablet Take 1 tablet (5 mg total) by mouth at bedtime as needed for sleep. (Patient not taking: Reported on 12/11/2019) 30 tablet 2   No current facility-administered medications for this visit.      PHYSICAL EXAMINATION:   Vitals:   12/11/19 1344  BP: 123/87  Pulse: 73  Resp: 20  Temp: (!) 94.5 F (34.7 C)   Filed Weights   12/11/19 1344  Weight: (!) 360 lb (163.3 kg)    Physical Exam  Constitutional: She is oriented to person, place, and time and well-developed, well-nourished, and in no distress.  HENT:  Head: Normocephalic and atraumatic.  Mouth/Throat: Oropharynx is clear and moist. No oropharyngeal exudate.  Eyes: Pupils are equal, round, and reactive to light.  Cardiovascular: Normal rate and regular rhythm.  Pulmonary/Chest: No respiratory distress. She has no wheezes.  Abdominal: Soft. Bowel sounds are normal. She exhibits no distension and no mass. There is no abdominal tenderness. There is no rebound and no guarding.  Musculoskeletal:        General: No tenderness or edema. Normal range of motion.     Cervical back: Normal range of motion and neck supple.  Neurological: She is alert and oriented to person, place, and  time.  Skin: Skin is warm.  Psychiatric: Affect normal.    LABORATORY DATA:  I have reviewed the data as listed Lab Results  Component Value Date   WBC 6.0 12/11/2019   HGB 12.7 12/11/2019   HCT 41.4 12/11/2019   MCV 91.2 12/11/2019   PLT 296 12/11/2019   Recent Labs    12/23/18 1028 12/23/18 1028 02/03/19 1337 08/10/19 1038 12/11/19 1328  NA 137   < > 136 139 135  K 3.7   < > 4.1 3.5 3.9  CL 103   < > 106 104 102  CO2 26   < > 24 26 25   GLUCOSE 75   < > 85 113* 91  BUN 9   < > 10 10 18  CREATININE 0.77   < > 0.78 0.80 1.02*  CALCIUM 7.8*   < > 8.6* 8.6* 8.4*  GFRNONAA  --   --  >60 >60 >60  GFRAA  --   --  >60 >60 >60  PROT 7.3  --  7.6 7.2  --   ALBUMIN 3.9  --  3.8 3.7  --   AST 19  --  38 28  --   ALT 8  --  31 26  --   ALKPHOS 135*  --  108 94  --   BILITOT 0.4  --  0.5 0.5  --    < > = values in this interval not displayed.     No results found.  Malabsorption of iron # Iron def anemia sec to iron malabsorption-/gastric bypass-menstrual blood loss.  Hemoglobin is 12.7.  No iron studies today.  Patient is asymptomatic.  Hold off IV iron.  # B12 deficiency gastric bypass-patient not on B12 sublingual.  Proceed with B12 injection today.   # Menorrhagia chronic s/p TAH [facility]-postoperative no complications.  #History of DVT/calf-unprovoked s/p prophylactic Lovenox for the recent TAH uneventful.  # DISPOSITION:  # B12 injection today;  NO Ferrahem.  # follow up in 6 months- MD; cbc/bmp/iron studies/ferritin- -possible Ferrahem/B12 injection- dr.B  All questions were answered. The patient knows to call the clinic with any problems, questions or concerns.    Cammie Sickle, MD 12/11/2019 2:24 PM

## 2019-12-21 ENCOUNTER — Other Ambulatory Visit: Payer: Self-pay | Admitting: Internal Medicine

## 2019-12-21 DIAGNOSIS — G4700 Insomnia, unspecified: Secondary | ICD-10-CM

## 2019-12-21 MED ORDER — ZOLPIDEM TARTRATE 5 MG PO TABS: 5.0000 mg | ORAL_TABLET | Freq: Every evening | ORAL | 2 refills | Status: DC | PRN

## 2020-01-10 ENCOUNTER — Other Ambulatory Visit: Payer: Self-pay

## 2020-01-11 ENCOUNTER — Other Ambulatory Visit: Payer: Self-pay

## 2020-01-11 ENCOUNTER — Encounter: Payer: Self-pay | Admitting: Internal Medicine

## 2020-01-11 ENCOUNTER — Ambulatory Visit: Payer: BC Managed Care – PPO | Admitting: Internal Medicine

## 2020-01-11 VITALS — BP 130/82 | HR 68 | Temp 96.0°F | Ht 69.5 in | Wt 376.0 lb

## 2020-01-11 DIAGNOSIS — I10 Essential (primary) hypertension: Secondary | ICD-10-CM | POA: Diagnosis not present

## 2020-01-11 DIAGNOSIS — F419 Anxiety disorder, unspecified: Secondary | ICD-10-CM

## 2020-01-11 DIAGNOSIS — M25561 Pain in right knee: Secondary | ICD-10-CM

## 2020-01-11 DIAGNOSIS — D509 Iron deficiency anemia, unspecified: Secondary | ICD-10-CM | POA: Diagnosis not present

## 2020-01-11 DIAGNOSIS — N644 Mastodynia: Secondary | ICD-10-CM

## 2020-01-11 DIAGNOSIS — Z13220 Encounter for screening for lipoid disorders: Secondary | ICD-10-CM

## 2020-01-11 DIAGNOSIS — G8929 Other chronic pain: Secondary | ICD-10-CM

## 2020-01-11 DIAGNOSIS — Z1329 Encounter for screening for other suspected endocrine disorder: Secondary | ICD-10-CM

## 2020-01-11 DIAGNOSIS — Z1389 Encounter for screening for other disorder: Secondary | ICD-10-CM

## 2020-01-11 DIAGNOSIS — F329 Major depressive disorder, single episode, unspecified: Secondary | ICD-10-CM

## 2020-01-11 MED ORDER — VORTIOXETINE HBR 5 MG PO TABS: 5.0000 mg | ORAL_TABLET | Freq: Every day | ORAL | 0 refills | Status: DC

## 2020-01-11 NOTE — Patient Instructions (Addendum)
Let me know when ready to see Dr. Kreg Shropshire  Call ob/gyn about nipple sensitivity  Ortho 2nd opinion Dr. Alvan Dame in [place] or Dr. Maureen Ralphs   Vortioxetine oral tablet What is this medicine? Vortioxetine (vor tee [employer] e teen) is used to treat depression. This medicine may be used for other purposes; ask your health care provider or pharmacist if you have questions. COMMON BRAND NAME(S): BRINTELLIX, Trintellix What should I tell my health care provider before I take this medicine? They need to know if you have any of these conditions:  bipolar disorder or a family history of bipolar disorder  bleeding disorders  drink alcohol  glaucoma  liver disease  low levels of sodium in the blood  seizures  suicidal thoughts, plans, or attempt; a previous suicide attempt by you or a family member  take medicines that treat or prevent blood clots  an unusual or allergic reaction to vortioxetine, other medicines, foods, dyes, or preservatives  pregnant or trying to get pregnant  breast-feeding How should I use this medicine? Take this medicine by mouth with a glass of water. Follow the directions on the prescription label. You can take it with or without food. If it upsets your stomach, take it with food. Take your medicine at regular intervals. Do not take it more often than directed. Do not stop taking this medicine suddenly except upon the advice of your doctor. Stopping this medicine too quickly may cause serious side effects or your condition may worsen. A special MedGuide will be given to you by the pharmacist with each prescription and refill. Be sure to read this information carefully each time. Talk to your pediatrician regarding the use of this medicine in children. Special care may be needed. Overdosage: If you think you have taken too much of this medicine contact a poison control center or emergency room at once. NOTE: This medicine is only for you. Do not share this medicine with  others. What if I miss a dose? If you miss a dose, take it as soon as you can. If it is almost time for your next dose, take only that dose. Do not take double or extra doses. What may interact with this medicine? Do not take this medicine with any of the following medications:  linezolid  MAOIs like Carbex, Eldepryl, Marplan, Nardil, and Parnate  methylene blue (injected into a vein) This medicine may also interact with the following medications:  alcohol  aspirin and aspirin-like medicines  carbamazepine  certain medicines for depression, anxiety, or psychotic disturbances  certain medicines for migraine headache like almotriptan, eletriptan, frovatriptan, naratriptan, rizatriptan, sumatriptan, zolmitriptan  diuretics  fentanyl  furazolidone  isoniazid  medicines that treat or prevent blood clots like warfarin, enoxaparin, and dalteparin  NSAIDs, medicines for pain and inflammation, like ibuprofen or naproxen  phenytoin  procarbazine  quinidine  rasagiline  rifampin  supplements like St. John's wort, kava kava, valerian  tramadol  tryptophan This list may not describe all possible interactions. Give your health care provider a list of all the medicines, herbs, non-prescription drugs, or dietary supplements you use. Also tell them if you smoke, drink alcohol, or use illegal drugs. Some items may interact with your medicine. What should I watch for while using this medicine? Tell your doctor if your symptoms do not get better or if they get worse. Visit your doctor or health care professional for regular checks on your progress. Because it may take several weeks to see the full effects of  this medicine, it is important to continue your treatment as prescribed by your doctor. Patients and their families should watch out for new or worsening thoughts of suicide or depression. Also watch out for sudden changes in feelings such as feeling anxious, agitated, panicky,  irritable, hostile, aggressive, impulsive, severely restless, overly excited and hyperactive, or not being able to sleep. If this happens, especially at the beginning of treatment or after a change in dose, call your health care professional. Dennis Bast may get drowsy or dizzy. Do not drive, use machinery, or do anything that needs mental alertness until you know how this medicine affects you. Do not stand or sit up quickly, especially if you are an older patient. This reduces the risk of dizzy or fainting spells. Alcohol may interfere with the effect of this medicine. Avoid alcoholic drinks. Your mouth may get dry. Chewing sugarless gum or sucking hard candy, and drinking plenty of water may help. Contact your doctor if the problem does not go away or is severe. What side effects may I notice from receiving this medicine? Side effects that you should report to your doctor or health care professional as soon as possible:  allergic reactions like skin rash, itching or hives, swelling of the face, lips, or tongue  anxious  black, tarry stools  changes in vision  confusion  elevated mood, decreased need for sleep, racing thoughts, impulsive behavior  eye pain  fast, irregular heartbeat  feeling faint or lightheaded, falls  feeling agitated, angry, or irritable  hallucination, loss of contact with reality  loss of balance or coordination  loss of memory  painful or prolonged erections  restlessness, pacing, inability to keep still  seizures  stiff muscles  suicidal thoughts or other mood changes  trouble sleeping  unusual bleeding or bruising  unusually weak or tired  vomiting Side effects that usually do not require medical attention (report to your doctor or health care professional if they continue or are bothersome):  change in appetite or weight  change in sex drive or performance  constipation  dizziness  dry mouth  nausea This list may not describe all  possible side effects. Call your doctor for medical advice about side effects. You may report side effects to FDA at 1-800-FDA-1088. Where should I keep my medicine? Keep out of the reach of children. Store at room temperature between 15 and 30 degrees C (59 and 86 degrees F). Throw away any unused medicine after the expiration date. NOTE: This sheet is a summary. It may not cover all possible information. If you have questions about this medicine, talk to your doctor, pharmacist, or health care provider.  2020 Elsevier/Gold Standard (2016-04-02 16:45:13)

## 2020-01-11 NOTE — Progress Notes (Signed)
Chief Complaint  Patient presents with  . Follow-up   F/u  1. HTN doing  Better on hctz 12.5 mg qd  2. Chronic right knee pain with meniscus tear, tricomp arthritis changes, moderate bakers cyst and recurrent fluid despite aspiration pain 1/10 and limits exercise  IMPRESSION: 09/2019 1. Degenerated and torn anterior horn lateral meniscus. 2. Tricompartmental degenerative changes, most significant at the patellofemoral joint. 3. Lateral tilt and orientation of the patella and increased TT-TG distance at 24 mm. 4. Moderate to large joint effusion and mild to moderate synovitis. There is also a moderate-sized Baker's cyst. 5. Intact ligamentous structures and no acute bony findings   3. Morbid obesity s/p gastric bypass disc Dr. Kreg Shropshire in future  4. Anxiety and depression tried zoloft in the past no help and effexor no help so stopped, trazadone had side effects and on ambien for sleep but still not sleeping GAD 7 score 10 and PHQ9 score 9 Triggers mom sick with brain cancer and complications and she has to assist her with ADLS and IADLS 5. C/o nipple pain mammo 08/2019 normal this was with cycles she is post hysterectomy and blood cts improved   Review of Systems  Constitutional: Negative for weight loss.  HENT: Negative for hearing loss.   Respiratory: Negative for shortness of breath.   Cardiovascular: Negative for chest pain.  Gastrointestinal: Negative for abdominal pain.  Musculoskeletal: Positive for joint pain.  Neurological: Negative for headaches.  Psychiatric/Behavioral: Positive for depression. The patient is nervous/anxious.    Past Medical History:  Diagnosis Date  . Anemia   . Anxiety   . Depression   . Fibroid   . Hypertension   . Iron deficiency anemia   . Right leg DVT [facility])    Past Surgical History:  Procedure Laterality Date  . GASTRIC BYPASS     2007  . LAPAROSCOPIC TOTAL HYSTERECTOMY     removed cervix/uterus, removal of b/l tubes and ovary(s)  fibroid 09/04/19 [facility] Dr. Delle Reining, Iona Coach MD; no need for future paps as of 11/22/19    Family History  Problem Relation Age of Onset  . Hypertension Mother   . Cancer Mother        brain  . Hyperlipidemia Father   . Diabetes Maternal Grandmother    Social History   Socioeconomic History  . Marital status: Married    Spouse name: Not on file  . Number of children: Not on file  . Years of education: Not on file  . Highest education level: Not on file  Occupational History  . Not on file  Tobacco Use  . Smoking status: Never Smoker  . Smokeless tobacco: Never Used  Substance and Sexual Activity  . Alcohol use: Not Currently  . Drug use: Not Currently  . Sexual activity: Yes    Comment: men  Other Topics Concern  . Not on file  Social History Narrative   Married Cytogeneticist    2 kids 8 and 13 y.o as of 12/20/2018    Moved from [place] to [place] to [place]    Owns guns, wears seat belt, safe in relationship    3 pregnancies 2 live births       Work at [employer] in [employer].    Social Determinants of Health   Financial Resource Strain:   . Difficulty of Paying Living Expenses: Not on file  Food Insecurity:   . Worried About Charity fundraiser in the Last Year: Not on file  .  Ran Out of Food in the Last Year: Not on file  Transportation Needs:   . Lack of Transportation (Medical): Not on file  . Lack of Transportation (Non-Medical): Not on file  Physical Activity:   . Days of Exercise per Week: Not on file  . Minutes of Exercise per Session: Not on file  Stress:   . Feeling of Stress : Not on file  Social Connections:   . Frequency of Communication with Friends and Family: Not on file  . Frequency of Social Gatherings with Friends and Family: Not on file  . Attends Religious Services: Not on file  . Active Member of Clubs or Organizations: Not on file  . Attends Archivist Meetings: Not on file  . Marital Status: Not on file  Intimate Partner Violence:    . Fear of Current or Ex-Partner: Not on file  . Emotionally Abused: Not on file  . Physically Abused: Not on file  . Sexually Abused: Not on file   Current Meds  Medication Sig  . Cholecalciferol 1.25 MG (50000 UT) capsule Take 1 capsule (50,000 Units total) by mouth once a week.  . hydrochlorothiazide (HYDRODIURIL) 12.5 MG tablet Take 1 tablet (12.5 mg total) by mouth daily.  Marland Kitchen zolpidem (AMBIEN) 5 MG tablet Take 1 tablet (5 mg total) by mouth at bedtime as needed for sleep.   Allergies  Allergen Reactions  . Trazodone And Nefazodone     Felt weird     Recent Results (from the past 2160 hour(s))  Basic metabolic panel     Status: Abnormal   Collection Time: 12/11/19  1:28 PM  Result Value Ref Range   Sodium 135 135 - 145 mmol/L   Potassium 3.9 3.5 - 5.1 mmol/L   Chloride 102 98 - 111 mmol/L   CO2 25 22 - 32 mmol/L   Glucose, Bld 91 70 - 99 mg/dL   BUN 18 6 - 20 mg/dL   Creatinine, Ser 1.02 (H) 0.44 - 1.00 mg/dL   Calcium 8.4 (L) 8.9 - 10.3 mg/dL   GFR calc non Af Amer >60 >60 mL/min   GFR calc Af Amer >60 >60 mL/min   Anion gap 8 5 - 15    Comment: Performed at [facility], [address] [place], [place] [address]  CBC with Differential     Status: None   Collection Time: 12/11/19  1:28 PM  Result Value Ref Range   WBC 6.0 4.0 - 10.5 K/uL   RBC 4.54 3.87 - 5.11 MIL/uL   Hemoglobin 12.7 12.0 - 15.0 g/dL   HCT 41.4 36.0 - 46.0 %   MCV 91.2 80.0 - 100.0 fL   MCH 28.0 26.0 - 34.0 pg   MCHC 30.7 30.0 - 36.0 g/dL   RDW 13.2 11.5 - 15.5 %   Platelets 296 150 - 400 K/uL   nRBC 0.0 0.0 - 0.2 %   Neutrophils Relative % 52 %   Neutro Abs 3.2 1.7 - 7.7 K/uL   Lymphocytes Relative 39 %   Lymphs Abs 2.4 0.7 - 4.0 K/uL   Monocytes Relative 7 %   Monocytes Absolute 0.4 0.1 - 1.0 K/uL   Eosinophils Relative 1 %   Eosinophils Absolute 0.0 0.0 - 0.5 K/uL   Basophils Relative 1 %   Basophils Absolute 0.0 0.0 - 0.1 K/uL   Immature Granulocytes 0 %   Abs Immature  Granulocytes 0.01 0.00 - 0.07 K/uL    Comment: Performed at [facility], [address], [place], [place] [address]   Objective  Body mass index is 54.73 kg/m. Wt Readings from Last 3 Encounters:  01/11/20 (!) 376 lb (170.6 kg)  12/11/19 (!) 360 lb (163.3 kg)  08/10/19 (!) 379 lb (171.9 kg)   Temp Readings from Last 3 Encounters:  01/11/20 (!) 96 F (35.6 C) (Temporal)  12/11/19 (!) 94.5 F (34.7 C) (Tympanic)  08/10/19 (!) 97.3 F (36.3 C) (Tympanic)   BP Readings from Last 3 Encounters:  01/11/20 130/82  12/11/19 123/87  08/10/19 (!) 164/102   Pulse Readings from Last 3 Encounters:  01/11/20 68  12/11/19 73  08/10/19 70    Physical Exam Vitals and nursing note reviewed.  Constitutional:      Appearance: Normal appearance. She is well-developed and well-groomed. She is morbidly obese.  HENT:     Head: Normocephalic and atraumatic.  Eyes:     Conjunctiva/sclera: Conjunctivae normal.     Pupils: Pupils are equal, round, and reactive to light.  Cardiovascular:     Rate and Rhythm: Normal rate and regular rhythm.     Heart sounds: Normal heart sounds. No murmur.  Pulmonary:     Effort: Pulmonary effort is normal.     Breath sounds: Normal breath sounds.  Musculoskeletal:       Legs:  Skin:    General: Skin is warm and dry.  Neurological:     General: No focal deficit present.     Mental Status: She is alert and oriented to person, place, and time. Mental status is at baseline.     Gait: Gait normal.  Psychiatric:        Attention and Perception: Attention and perception normal.        Mood and Affect: Mood and affect normal.        Speech: Speech normal.        Behavior: Behavior normal. Behavior is cooperative.        Thought Content: Thought content normal.        Cognition and Memory: Cognition and memory normal.        Judgment: Judgment normal.     Assessment  Plan  Essential hypertension - Plan: Lipid panel, TSH, Hepatic function panel  Cont meds  Monitor BP   Iron deficiency anemia, unspecified iron deficiency anemia type - Plan: Iron, TIBC and Ferritin Panel Better s/p hysterectomy   Anxiety and depression - Plan: vortioxetine HBr (TRINTELLIX) 5 MG TABS tablet See HPI  Declines therapy   Chronic pain of right knee - Plan: Ambulatory referral to Orthopedic Surgery Dr. Alvan Dame  Morbid obesity [facility]) Considering Dr. Darnell Level referral hold for now   Nipple pain  Disc with ob/gyn  mammo 08/2019 negative   HM Declines flu shot of not she had flu 2 weeks ago entire family. Last flu shot in 2018  -she may get in 2020   Tdap thinks had in 2018  LMP 12/09/2018, Pap 2018 OB/GYN in [place] get records Pap negative 10/11/17 neg pap neg HPV s/p hysterectomy  mammo 08/2019 negative   Former PCP Dr. Kathe Mariner [facility]  Get records [facility] and [place] seen 01/17/18 DVT right lower ext 11/13/2016 s/p Eliquis x 2 months and iron def anemia hbg 6.4, 7.6  -h/o low Hbg A2 1.6 range 1.8-3.2 may indicate alpha thal or iron def anemia  H/o elevated lfts AST 42 range was 14-36  Provider: Dr. Olivia Mackie McLean-Scocuzza-Internal Medicine

## 2020-01-12 ENCOUNTER — Encounter: Payer: Self-pay | Admitting: Internal Medicine

## 2020-01-15 DIAGNOSIS — M1711 Unilateral primary osteoarthritis, right knee: Secondary | ICD-10-CM | POA: Diagnosis not present

## 2020-01-15 DIAGNOSIS — Z6841 Body Mass Index (BMI) 40.0 and over, adult: Secondary | ICD-10-CM | POA: Diagnosis not present

## 2020-01-15 DIAGNOSIS — M25561 Pain in right knee: Secondary | ICD-10-CM | POA: Diagnosis not present

## 2020-01-16 ENCOUNTER — Telehealth: Payer: Self-pay | Admitting: Internal Medicine

## 2020-01-16 NOTE — Telephone Encounter (Signed)
Prior authorization has been submitted for patient's Trintellix.   Awaiting approval or denial.

## 2020-01-22 ENCOUNTER — Other Ambulatory Visit: Payer: Self-pay | Admitting: Internal Medicine

## 2020-01-22 DIAGNOSIS — F329 Major depressive disorder, single episode, unspecified: Secondary | ICD-10-CM

## 2020-01-22 DIAGNOSIS — F419 Anxiety disorder, unspecified: Secondary | ICD-10-CM

## 2020-01-22 MED ORDER — FLUOXETINE HCL 20 MG PO CAPS: ORAL_CAPSULE | ORAL | 3 refills | Status: DC

## 2020-01-22 NOTE — Telephone Encounter (Signed)
Prior authorization has been denied.   Document: _[id number] Decision Notes: Our prior authorization criteria for Trintellix have not been met. From the records that we have received, the following caused the denial of Trintellix. 1) Two selective serotonin reuptake inhibitor (SSRI) antidepressants, such as sertraline, citalopram, escitalopram, fluoxetine, or paroxetine, have not been tried and failed. Since the criteria have not been met, we are not able to approve. ADDITIONAL INFORMATION FOR YOUR HEALTH CARE PROVIDER: This request has not been approved because our prior authorization criteria for Trintellix have not been met. From the information we have received, the member does not meet number 2 of our prior authorization criteria for Trintellix. The reason for denial is explained to the member above. The criteria are listed here. 1) Member has a diagnosis of Major Depressive Disorder (MDD), AND 2) Patient has tried and failed, or was intolerant to, two selective serotonin reuptake inhibitors (SSRIs), AND 3) Patient has tried and failed, or was intolerant to, one serotonin-norepinephrine reuptake inhibitor (SNRI). Since criteria have not been met, we are unable to approve coverage for this drug at this time.  Please advise

## 2020-01-22 NOTE — Telephone Encounter (Signed)
Patient informed and verbalized understanding. They would like to try the Prozac

## 2020-01-22 NOTE — Telephone Encounter (Signed)
Insurance will not cover trintellix  Does she want to try prozac? Has less side effect of weight gain this helps anxiety/depression   Wellbutrin also can help but only helps with not really with anxiety and less chance of weight gain  Which option would she like to try?     [place]

## 2020-01-26 ENCOUNTER — Other Ambulatory Visit (INDEPENDENT_AMBULATORY_CARE_PROVIDER_SITE_OTHER): Payer: BC Managed Care – PPO

## 2020-01-26 ENCOUNTER — Other Ambulatory Visit: Payer: Self-pay

## 2020-01-26 DIAGNOSIS — Z1389 Encounter for screening for other disorder: Secondary | ICD-10-CM

## 2020-01-26 DIAGNOSIS — D509 Iron deficiency anemia, unspecified: Secondary | ICD-10-CM | POA: Diagnosis not present

## 2020-01-26 DIAGNOSIS — Z13220 Encounter for screening for lipoid disorders: Secondary | ICD-10-CM | POA: Diagnosis not present

## 2020-01-26 DIAGNOSIS — I10 Essential (primary) hypertension: Secondary | ICD-10-CM | POA: Diagnosis not present

## 2020-01-26 DIAGNOSIS — Z1329 Encounter for screening for other suspected endocrine disorder: Secondary | ICD-10-CM

## 2020-01-26 LAB — LIPID PANEL
Cholesterol: 191 mg/dL (ref 0–200)
HDL: 66.1 mg/dL (ref >39.00)
LDL Cholesterol: 106 mg/dL — ABNORMAL HIGH (ref 0–99)
NonHDL: 124.5
Total CHOL/HDL Ratio: 3
Triglycerides: 91 mg/dL (ref 0.0–149.0)
VLDL: 18.2 mg/dL (ref 0.0–40.0)

## 2020-01-26 LAB — TSH: TSH: 1.43 u[IU]/mL (ref 0.35–4.50)

## 2020-01-26 LAB — HEPATIC FUNCTION PANEL
ALT: 19 U/L (ref 0–35)
AST: 22 U/L (ref 0–37)
Albumin: 3.8 g/dL (ref 3.5–5.2)
Alkaline Phosphatase: 114 U/L (ref 39–117)
Bilirubin, Direct: 0.1 mg/dL (ref 0.0–0.3)
Total Bilirubin: 0.5 mg/dL (ref 0.2–1.2)
Total Protein: 7 g/dL (ref 6.0–8.3)

## 2020-01-26 NOTE — Addendum Note (Signed)
Addended by: Leeanne Rio on: 01/26/2020 08:22 AM   Modules accepted: Orders

## 2020-01-27 LAB — IRON,TIBC AND FERRITIN PANEL
%SAT: 34 % (calc) (ref 16–45)
Ferritin: 429 ng/mL — ABNORMAL HIGH (ref 16–232)
Iron: 88 ug/dL (ref 40–190)
TIBC: 258 mcg/dL (calc) (ref 250–450)

## 2020-01-29 ENCOUNTER — Encounter: Payer: Self-pay | Admitting: Internal Medicine

## 2020-01-29 NOTE — Progress Notes (Signed)
Received records on patient from [facility].

## 2020-02-02 DIAGNOSIS — M25561 Pain in right knee: Secondary | ICD-10-CM | POA: Diagnosis not present

## 2020-02-02 DIAGNOSIS — M1711 Unilateral primary osteoarthritis, right knee: Secondary | ICD-10-CM | POA: Diagnosis not present

## 2020-02-02 DIAGNOSIS — G8929 Other chronic pain: Secondary | ICD-10-CM | POA: Diagnosis not present

## 2020-02-02 DIAGNOSIS — M7121 Synovial cyst of popliteal space [Baker], right knee: Secondary | ICD-10-CM | POA: Diagnosis not present

## 2020-02-02 DIAGNOSIS — M25461 Effusion, right knee: Secondary | ICD-10-CM | POA: Diagnosis not present

## 2020-02-03 ENCOUNTER — Ambulatory Visit: Payer: BC Managed Care – PPO | Attending: Internal Medicine

## 2020-02-03 DIAGNOSIS — Z23 Encounter for immunization: Secondary | ICD-10-CM

## 2020-02-03 NOTE — Progress Notes (Signed)
   Covid-19 Vaccination Clinic  Name:  Alyssa Cain    MRN: [id number] DOB: 01/08/1976  02/03/2020  [name] was observed post Covid-19 immunization for 15 minutes without incident. She was provided with Vaccine Information Sheet and instruction to access the V-Safe system.   [name] was instructed to call 911 with any severe reactions post vaccine: Marland Kitchen Difficulty breathing  . Swelling of face and throat  . A fast heartbeat  . A bad rash all over body  . Dizziness and weakness   Immunizations Administered    Name Date Dose VIS Date Route   Pfizer COVID-19 Vaccine 02/03/2020  1:16 PM 0.3 mL 10/27/2019 Intramuscular   Manufacturer: [place]   Lot: [id number]   [place]: [id number]

## 2020-02-14 ENCOUNTER — Encounter: Payer: Self-pay | Admitting: Internal Medicine

## 2020-02-14 DIAGNOSIS — G8929 Other chronic pain: Secondary | ICD-10-CM | POA: Insufficient documentation

## 2020-02-28 ENCOUNTER — Ambulatory Visit: Payer: BC Managed Care – PPO | Attending: Internal Medicine

## 2020-02-28 DIAGNOSIS — Z23 Encounter for immunization: Secondary | ICD-10-CM

## 2020-02-28 NOTE — Progress Notes (Signed)
   Covid-19 Vaccination Clinic  Name:  Alyssa Cain    MRN: [id number] DOB: Aug 01, 1976  02/28/2020  [name] was observed post Covid-19 immunization for 15 minutes without incident. She was provided with Vaccine Information Sheet and instruction to access the V-Safe system.   [name] was instructed to call 911 with any severe reactions post vaccine: Marland Kitchen Difficulty breathing  . Swelling of face and throat  . A fast heartbeat  . A bad rash all over body  . Dizziness and weakness   Immunizations Administered    Name Date Dose VIS Date Route   Pfizer COVID-19 Vaccine 02/28/2020  3:59 PM 0.3 mL 10/27/2019 Intramuscular   Manufacturer: [employer], [employer]   Lot: [id number]   [place]: [id number]

## 2020-03-01 ENCOUNTER — Encounter: Payer: Self-pay | Admitting: Internal Medicine

## 2020-03-05 ENCOUNTER — Ambulatory Visit
Payer: BC Managed Care – PPO | Attending: Student in an Organized Health Care Education/Training Program | Admitting: Student in an Organized Health Care Education/Training Program

## 2020-03-05 ENCOUNTER — Encounter: Payer: Self-pay | Admitting: Student in an Organized Health Care Education/Training Program

## 2020-03-05 ENCOUNTER — Other Ambulatory Visit: Payer: Self-pay

## 2020-03-05 VITALS — BP 151/97 | HR 82 | Temp 97.3°F | Resp 16 | Ht 69.0 in | Wt 376.0 lb

## 2020-03-05 DIAGNOSIS — M25561 Pain in right knee: Secondary | ICD-10-CM | POA: Diagnosis not present

## 2020-03-05 DIAGNOSIS — Z9884 Bariatric surgery status: Secondary | ICD-10-CM | POA: Insufficient documentation

## 2020-03-05 DIAGNOSIS — M1711 Unilateral primary osteoarthritis, right knee: Secondary | ICD-10-CM | POA: Insufficient documentation

## 2020-03-05 DIAGNOSIS — G8929 Other chronic pain: Secondary | ICD-10-CM | POA: Diagnosis not present

## 2020-03-05 NOTE — Progress Notes (Signed)
Safety precautions to be maintained throughout the outpatient stay will include: orient to surroundings, keep bed in low position, maintain call bell within reach at all times, provide assistance with transfer out of bed and ambulation.  

## 2020-03-05 NOTE — Progress Notes (Signed)
Patient: Alyssa Cain  Service Category: E/M  Provider: Gillis Santa, MD  DOB: 06/05/1976  DOS: 03/05/2020  Referring Provider: Diamond Nickel, DO  MRN: [id number]  Setting: Ambulatory outpatient  PCP: McLean-Scocuzza, Nino Glow, MD  Type: New Patient  Specialty: Interventional Pain Management    Location: Office  Delivery: Face-to-face     Primary Reason(s) for Visit: Encounter for initial evaluation of one or more chronic problems (new to examiner) potentially causing chronic pain, and posing a threat to normal musculoskeletal function. (Level of risk: High) CC: Knee Pain (right )  HPI  Ms. Alyssa Cain is a 44 y.o. year old, female patient, who comes today to see Alyssa Cain for the first time for an initial evaluation of her chronic pain. She has Insomnia; Anxiety and depression; Essential hypertension; History of hypertension; H/O gastric bypass; Iron deficiency anemia; Personal history of DVT (deep vein thrombosis); Morbid obesity with BMI of 50.0-59.9, adult ([place]); B12 deficiency; Vitamin D deficiency; Malabsorption of iron; Fibroid; Morbid obesity ([place]); Annual physical exam; Chronic pain of right knee; and Primary osteoarthritis of right knee on their problem list. Today she comes in for evaluation of her Knee Pain (right )  Pain Assessment: Location: Right Knee Radiating: denies Onset:  >3 years ago Duration: Chronic pain Quality: Discomfort, Sharp Severity: 8 /10 (subjective, self-reported pain score)  Note: Reported level is compatible with observation.                         When using our objective Pain Scale, levels between 6 and 10/10 are said to belong in an emergency room, as it progressively worsens from a 6/10, described as severely limiting, requiring emergency care not usually available at an outpatient pain management facility. At a 6/10 level, communication becomes difficult and requires great effort. Assistance to reach the emergency department may be required. Facial flushing and profuse  sweating along with potentially dangerous increases in heart rate and blood pressure will be evident. Effect on ADL: weight bearing is what creates the pain. worse when she first stands up.  able to work out the kinks a little when she gets going. Timing:  worse with weight bearing Modifying factors: rest BP: (!) 151/97  HR: 82  Onset and Duration: Gradual and Present longer than 3 months Cause of pain: Unknown Severity: Getting worse, NAS-11 at its worse: 10/10, NAS-11 at its best: 7/10, NAS-11 now: 3/10 and NAS-11 on the average: 7/10 Timing: Not influenced by the time of the day, During activity or exercise and After a period of immobility Aggravating Factors: Climbing and Kneeling Alleviating Factors: Lying down, Resting and Sitting Associated Problems: Depression Quality of Pain: Sharp, Shooting and Uncomfortable Previous Examinations or Tests: MRI scan, X-rays and Orthopedic evaluation Previous Treatments: The patient denies na  The patient comes into the clinics today for the first time for a chronic pain management evaluation.   Patient is a pleasant 44 year old female who presents with a chief complaint of right knee pain due to severe tricompartmental degenerative joint disease, right knee osteoarthritis.  She has failed intra-articular knee steroid injection as well as viscosupplementation with Synvisc.  She is status post right knee steroid injection on 02/02/2020 which provided her short-term pain relief.  She is referred here by [name] for consideration of genicular nerve block/ablation.  Her pain is worse with weightbearing.  She has tried topical Voltaren, oral diclofenac which were not very effective.  Patient is status post gastric bypass surgery and  was to continue to lose weight but is having difficulty due to her right knee pain.   Meds   Current Outpatient Medications:  .  diclofenac (VOLTAREN) 75 MG EC tablet, Take 75 mg by mouth 2 (two) times daily., Disp: ,  Rfl:  .  FLUoxetine (PROZAC) 20 MG capsule, 1/2 pill x 1 week then 1 pill daily for mood and anxiety, Disp: 90 capsule, Rfl: 3 .  hydrochlorothiazide (HYDRODIURIL) 12.5 MG tablet, Take 1 tablet (12.5 mg total) by mouth daily., Disp: 90 tablet, Rfl: 3 .  zolpidem (AMBIEN) 5 MG tablet, Take 1 tablet (5 mg total) by mouth at bedtime as needed for sleep., Disp: 30 tablet, Rfl: 2 .  Cholecalciferol 1.25 MG (50000 UT) capsule, Take 1 capsule (50,000 Units total) by mouth once a week. (Patient not taking: Reported on 03/05/2020), Disp: 13 capsule, Rfl: 1 .  vortioxetine HBr (TRINTELLIX) 5 MG TABS tablet, Take 1 tablet (5 mg total) by mouth daily. (Patient not taking: Reported on 03/05/2020), Disp: 30 tablet, Rfl: 0  Imaging Review   Results for orders placed during the hospital encounter of 09/30/19  MR Knee Right Wo Contrast   Addendum ADDENDUM REPORT: 10/02/2019 09:54  ADDENDUM: Prior knee radiographs from 05/11/2019 are available for comparison and demonstrate tricompartmental degenerative changes.  The medial femoral lesion is not identified for certain on the radiographs. I do not see typical chondroid matrix/calcification. I still think it is most likely an enchondroma but it could be a benign bone cyst. I think at this point I would just get a follow-up MRI in 1 year to document stability.   Electronically Signed   By: Marijo Sanes M.D.   On: 10/02/2019 09:54       Kalman Jewels, MD 10/02/2019  9:56 AM         Narrative CLINICAL DATA:  Chronic knee pain.  EXAM: MRI OF THE RIGHT KNEE WITHOUT CONTRAST  TECHNIQUE: Multiplanar, multisequence MR imaging of the knee was performed. No intravenous contrast was administered.  COMPARISON:  None.  FINDINGS: MENISCI  Medial meniscus:  Intact.  Lateral meniscus:  The anterior horn is degenerated and torn.  LIGAMENTS  Cruciates:  Intact  Collaterals:  Intact  CARTILAGE  Patellofemoral: Advanced degenerative chondrosis with areas of  near full-thickness cartilage loss involving the lateral facet and lateral aspect of the patellar apex. There is also moderate lateral tilt and orientation of the patella with an elongated flat lateral facet.  Medial:  Mild degenerative chondrosis with early spurring changes.  Lateral: Moderate degenerative chondrosis with a fairly deep chondral fissure/tear involving the lateral femoral condyle. There is mild joint space narrowing and spurring.  Joint: Moderate to large joint effusion and mild to moderate synovitis.  Popliteal Fossa:  Moderate-sized Baker's cyst.  Extensor Mechanism: The patella retinacular structures are intact and the quadriceps and patellar tendons are intact. There is lateral tilt and orientation of the patella in relation to the femoral trochlear groove. The TT-TG distance is estimated at 24 mm.  Bones: No acute bony findings. There is a 22 x 17 mm lesion in the medial femoral metaphysis which has MR imaging features of a enchondroma. No worrisome or aggressive MR imaging features are identified. Recommend plain film correlation.  Other: Normal knee musculature.  IMPRESSION: 1. Degenerated and torn anterior horn lateral meniscus. 2. Tricompartmental degenerative changes, most significant at the patellofemoral joint. 3. Lateral tilt and orientation of the patella and increased TT-TG distance at 24 mm. 4. Moderate to large  joint effusion and mild to moderate synovitis. There is also a moderate-sized Baker's cyst. 5. Intact ligamentous structures and no acute bony findings.  Electronically Signed: By: Marijo Sanes M.D. On: 10/02/2019 08:35    Knee-R DG 4 views:  Results for orders placed during the hospital encounter of 05/11/19  DG Knee Complete 4 Views Right   Narrative CLINICAL DATA:  44 year old female with knee pain and swelling for 2 weeks, no injury.  EXAM: RIGHT KNEE - COMPLETE 4+ VIEW  COMPARISON:  None.  FINDINGS: Bone  mineralization is within normal limits. Weightbearing views. Normal alignment. No joint effusion. Patella intact. However, there is tricompartmental degenerative spurring. Joint spaces appear preserved. No acute osseous abnormality identified. No discrete soft tissue injury.  IMPRESSION: No acute osseous abnormality identified. Tricompartmental degenerative spurring.   Electronically Signed   By: Genevie Ann M.D.   On: 05/11/2019 14:18     Complexity Note: Imaging results reviewed. Results shared with Ms. Chesnut, using Layman's terms.                         ROS  Cardiovascular: High blood pressure Pulmonary or Respiratory: No reported pulmonary signs or symptoms such as wheezing and difficulty taking a deep full breath (Asthma), difficulty blowing air out (Emphysema), coughing up mucus (Bronchitis), persistent dry cough, or temporary stoppage of breathing during sleep Neurological: No reported neurological signs or symptoms such as seizures, abnormal skin sensations, urinary and/or fecal incontinence, being born with an abnormal open spine and/or a tethered spinal cord Psychological-Psychiatric: Anxiousness Gastrointestinal: No reported gastrointestinal signs or symptoms such as vomiting or evacuating blood, reflux, heartburn, alternating episodes of diarrhea and constipation, inflamed or scarred liver, or pancreas or irrregular and/or infrequent bowel movements Genitourinary: No reported renal or genitourinary signs or symptoms such as difficulty voiding or producing urine, peeing blood, non-functioning kidney, kidney stones, difficulty emptying the bladder, difficulty controlling the flow of urine, or chronic kidney disease Hematological: Weakness due to low blood hemoglobin or red blood cell count (Anemia) Endocrine: No reported endocrine signs or symptoms such as high or low blood sugar, rapid heart rate due to high thyroid levels, obesity or weight gain due to slow thyroid or thyroid  disease Rheumatologic: No reported rheumatological signs and symptoms such as fatigue, joint pain, tenderness, swelling, redness, heat, stiffness, decreased range of motion, with or without associated rash Musculoskeletal: Negative for myasthenia gravis, muscular dystrophy, multiple sclerosis or malignant hyperthermia Work History: Working full time  Allergies  Ms. Wyly is allergic to trazodone and nefazodone.  Laboratory Chemistry Profile   Renal Lab Results  Component Value Date   BUN 18 12/11/2019   CREATININE 1.02 (H) 12/11/2019   GFR 82.67 12/23/2018   GFRAA >60 12/11/2019   GFRNONAA >60 12/11/2019   PROTEINUR NEGATIVE 12/23/2018     Electrolytes Lab Results  Component Value Date   NA 135 12/11/2019   K 3.9 12/11/2019   CL 102 12/11/2019   CALCIUM 8.4 (L) 12/11/2019     Hepatic Lab Results  Component Value Date   AST 22 01/26/2020   ALT 19 01/26/2020   ALBUMIN 3.8 01/26/2020   ALKPHOS 114 01/26/2020     ID No results found for: LYMEIGGIGMAB, HIV, SARSCOV2NAA, STAPHAUREUS, MRSAPCR, HCVAB, PREGTESTUR, RMSFIGG, QFVRPH1IGG, QFVRPH2IGG, [facility]   Bone Lab Results  Component Value Date   VD25OH 36.4 08/10/2019     Endocrine Lab Results  Component Value Date   GLUCOSE 91 12/11/2019   GLUCOSEU  NEGATIVE 12/23/2018   HGBA1C 5.5 12/23/2018   TSH 1.43 01/26/2020   FREET4 0.74 12/23/2018     Neuropathy Lab Results  Component Value Date   VITAMINB12 228 08/10/2019   HGBA1C 5.5 12/23/2018     CNS No results found for: COLORCSF, APPEARCSF, RBCCOUNTCSF, WBCCSF, POLYSCSF, LYMPHSCSF, EOSCSF, PROTEINCSF, GLUCCSF, JCVIRUS, CSFOLI, IGGCSF, LABACHR, ACETBL, LABACHR, ACETBL   Inflammation (CRP: Acute  ESR: Chronic) No results found for: CRP, ESRSEDRATE, LATICACIDVEN   Rheumatology No results found for: RF, ANA, LABURIC, URICUR, LYMEIGGIGMAB, LYMEABIGMQN, HLAB27   Coagulation Lab Results  Component Value Date   PLT 296 12/11/2019     Cardiovascular Lab  Results  Component Value Date   HGB 12.7 12/11/2019   HCT 41.4 12/11/2019     Screening No results found for: SARSCOV2NAA, COVIDSOURCE, STAPHAUREUS, MRSAPCR, HCVAB, HIV, PREGTESTUR   Cancer No results found for: CEA, CA125, LABCA2   Allergens No results found for: ALMOND, APPLE, ASPARAGUS, AVOCADO, BANANA, BARLEY, BASIL, BAYLEAF, GREENBEAN, LIMABEAN, WHITEBEAN, BEEFIGE, REDBEET, BLUEBERRY, BROCCOLI, CABBAGE, MELON, CARROT, CASEIN, CASHEWNUT, CAULIFLOWER, CELERY     Note: Lab results reviewed.   PFSH  Drug: Ms. Lortz  reports previous drug use. Alcohol:  reports previous alcohol use. Tobacco:  reports that she has never smoked. She has never used smokeless tobacco. Medical:  has a past medical history of Anemia, Anxiety, Depression, Fibroid, Hypertension, Iron deficiency anemia, and Right leg DVT ([place]). Family: family history includes Cancer in her mother; Diabetes in her maternal grandmother; Hyperlipidemia in her father; Hypertension in her mother.  Past Surgical History:  Procedure Laterality Date  . GASTRIC BYPASS     2007  . LAPAROSCOPIC TOTAL HYSTERECTOMY     removed cervix/uterus, removal of b/l tubes and ovary(s) fibroid 09/04/19 [facility] Dr. Delle Reining, Iona Coach MD; no need for future paps as of 11/22/19    Active Ambulatory Problems    Diagnosis Date Noted  . Insomnia 12/20/2018  . Anxiety and depression 12/20/2018  . Essential hypertension 12/20/2018  . History of hypertension 12/20/2018  . H/O gastric bypass 12/20/2018  . Iron deficiency anemia 12/20/2018  . Personal history of DVT (deep vein thrombosis) 12/20/2018  . Morbid obesity with BMI of 50.0-59.9, adult ([place]) 12/20/2018  . B12 deficiency 12/23/2018  . Vitamin D deficiency 12/23/2018  . Malabsorption of iron 12/30/2018  . Fibroid 07/11/2019  . Morbid obesity ([place]) 07/11/2019  . Annual physical exam 07/11/2019  . Chronic pain of right knee 02/14/2020  . Primary osteoarthritis of right knee 03/05/2020    Resolved Ambulatory Problems    Diagnosis Date Noted  . No Resolved Ambulatory Problems   Past Medical History:  Diagnosis Date  . Anemia   . Anxiety   . Depression   . Hypertension   . Right leg DVT ([place])    Constitutional Exam  General appearance: alert, cooperative, in no distress and morbidly obese Vitals:   03/05/20 1114  BP: (!) 151/97  Pulse: 82  Resp: 16  Temp: (!) 97.3 F (36.3 C)  TempSrc: Temporal  SpO2: 100%  Weight: (!) 376 lb (170.6 kg)  Height: _0  (1.753 m)   BMI Assessment: Estimated body mass index is 55.53 kg/m as calculated from the following:   Height as of this encounter: _1  (1.753 m).   Weight as of this encounter: 376 lb (170.6 kg).  BMI interpretation table: BMI level Category Range association with higher incidence of chronic pain  <18 kg/m2 Underweight   18.5-24.9 kg/m2 Ideal body weight  25-29.9 kg/m2 Overweight Increased incidence by 20%  30-34.9 kg/m2 Obese (Class I) Increased incidence by 68%  35-39.9 kg/m2 Severe obesity (Class II) Increased incidence by 136%  >40 kg/m2 Extreme obesity (Class III) Increased incidence by 254%   Patient's current BMI Ideal Body weight  Body mass index is 55.53 kg/m. Ideal body weight: 66.2 kg (145 lb 15.1 oz) Adjusted ideal body weight: 107.9 kg (237 lb 15.5 oz)   BMI Readings from Last 4 Encounters:  03/05/20 55.53 kg/m  01/11/20 54.73 kg/m  12/11/19 52.40 kg/m  08/10/19 55.17 kg/m   Wt Readings from Last 4 Encounters:  03/05/20 (!) 376 lb (170.6 kg)  01/11/20 (!) 376 lb (170.6 kg)  12/11/19 (!) 360 lb (163.3 kg)  08/10/19 (!) 379 lb (171.9 kg)    Psych/Mental status: Alert, oriented x 3 (person, place, & time)       Eyes: PERLA Respiratory: No evidence of acute respiratory distress  Cervical Spine Exam  Skin & Axial Inspection: No masses, redness, edema, swelling, or associated skin lesions Alignment: Symmetrical Functional ROM: Unrestricted ROM      Stability: No  instability detected Muscle Tone/Strength: Functionally intact. No obvious neuro-muscular anomalies detected. Sensory (Neurological): Unimpaired Palpation: No palpable anomalies              Upper Extremity (UE) Exam    Side: Right upper extremity  Side: Left upper extremity  Skin & Extremity Inspection: Skin color, temperature, and hair growth are WNL. No peripheral edema or cyanosis. No masses, redness, swelling, asymmetry, or associated skin lesions. No contractures.  Skin & Extremity Inspection: Skin color, temperature, and hair growth are WNL. No peripheral edema or cyanosis. No masses, redness, swelling, asymmetry, or associated skin lesions. No contractures.  Functional ROM: Unrestricted ROM          Functional ROM: Unrestricted ROM          Muscle Tone/Strength: Functionally intact. No obvious neuro-muscular anomalies detected.  Muscle Tone/Strength: Functionally intact. No obvious neuro-muscular anomalies detected.  Sensory (Neurological): Unimpaired          Sensory (Neurological): Unimpaired          Palpation: No palpable anomalies              Palpation: No palpable anomalies              Provocative Test(s):  Phalen's test: deferred Tinel's test: deferred Apley's scratch test (touch opposite shoulder):  Action 1 (Across chest): deferred Action 2 (Overhead): deferred Action 3 (LB reach): deferred   Provocative Test(s):  Phalen's test: deferred Tinel's test: deferred Apley's scratch test (touch opposite shoulder):  Action 1 (Across chest): deferred Action 2 (Overhead): deferred Action 3 (LB reach): deferred     Lumbar Exam  Skin & Axial Inspection: No masses, redness, or swelling Alignment: Symmetrical Functional ROM: Unrestricted ROM       Stability: No instability detected Muscle Tone/Strength: Functionally intact. No obvious neuro-muscular anomalies detected. Sensory (Neurological): Unimpaired Palpation: No palpable anomalies       Provocative  Tests: Hyperextension/rotation test: deferred today       Lumbar quadrant test (Kemp's test): deferred today       Lateral bending test: deferred today       Patrick's Maneuver: deferred today                   FABER* test: deferred today  S-I anterior distraction/compression test: deferred today         S-I lateral compression test: deferred today         S-I Thigh-thrust test: deferred today         S-I Gaenslen's test: deferred today         *(Flexion, ABduction and External Rotation)  Gait & Posture Assessment  Ambulation: Unassisted Gait: Relatively normal for age and body habitus Posture: WNL   Lower Extremity Exam    Side: Right lower extremity  Side: Left lower extremity  Stability: No instability observed          Stability: No instability observed          Skin & Extremity Inspection: Skin color, temperature, and hair growth are WNL. No peripheral edema or cyanosis. No masses, redness, swelling, asymmetry, or associated skin lesions. No contractures.  Skin & Extremity Inspection: Skin color, temperature, and hair growth are WNL. No peripheral edema or cyanosis. No masses, redness, swelling, asymmetry, or associated skin lesions. No contractures.  Functional ROM: Pain restricted ROM for hip and knee joints          Functional ROM: Unrestricted ROM                  Muscle Tone/Strength: Functionally intact. No obvious neuro-muscular anomalies detected.  Muscle Tone/Strength: Functionally intact. No obvious neuro-muscular anomalies detected.  Sensory (Neurological): Arthropathic arthralgia        Sensory (Neurological): Unimpaired        DTR: Patellar: 0: absent Achilles: deferred today Plantar: deferred today  DTR: Patellar: 0: absent Achilles: deferred today Plantar: deferred today  Palpation: No palpable anomalies  Palpation: No palpable anomalies   Assessment  Primary Diagnosis & Pertinent Problem List: The primary encounter diagnosis was Primary  osteoarthritis of right knee. Diagnoses of Chronic pain of right knee, Morbid obesity ([place]), and H/O gastric bypass were also pertinent to this visit.  Visit Diagnosis (New problems to examiner): 1. Primary osteoarthritis of right knee   2. Chronic pain of right knee   3. Morbid obesity ([place])   4. H/O gastric bypass    Plan of Care (Initial workup plan)   Patient is a 44 year old female with a history of morbid obesity, right knee osteoarthritis who presents with a chief complaint of right knee pain referred by [name].  She has failed right intra-articular knee steroid injection, right intra-articular knee viscosupplementation.  She has increased pain with weightbearing.  I spent a great deal of time reviewing the details of the genicular nerve block and a subsequent radiofrequency ablation.  Risks and benefits of this procedure were discussed and patient would like to proceed.   Procedure Orders     GENICULAR NERVE BLOCK  Interventional management options: Ms. Ulrey was informed that there is no guarantee that she would be a candidate for interventional therapies. The decision will be based on the results of diagnostic studies, as well as Ms. Gheen risk profile.  Procedure(s) under consideration:  Right knee genicular nerve block with sedation   Provider-requested follow-up: Return in about 1 week (around 03/12/2020) for R GNB , with sedation.  Future Appointments  Date Time Provider [place]  04/16/2020  8:00 AM McLean-Scocuzza, Nino Glow, MD [facility]  06/10/2020  1:15 PM [facility] None  06/10/2020  1:45 PM Cammie Sickle, MD [facility] None  06/10/2020  2:15 PM [facility] INFUSION CHAIR 4 [facility] None    Note by: Gillis Santa, MD  Date: 03/05/2020; Time: 1:11 PM

## 2020-03-05 NOTE — Patient Instructions (Signed)
____________________________________________________________________________________________  General Risks and Possible Complications  Patient Responsibilities: It is important that you read this as it is part of your informed consent. It is our duty to inform you of the risks and possible complications associated with treatments offered to you. It is your responsibility as a patient to read this and to ask questions about anything that is not clear or that you believe was not covered in this document.  Patient's Rights: You have the right to refuse treatment. You also have the right to change your mind, even after initially having agreed to have the treatment done. However, under this last option, if you wait until the last second to change your mind, you may be charged for the materials used up to that point.  Introduction: Medicine is not an exact science. Everything in Medicine, including the lack of treatment(s), carries the potential for danger, harm, or loss (which is by definition: Risk). In Medicine, a complication is a secondary problem, condition, or disease that can aggravate an already existing one. All treatments carry the risk of possible complications. The fact that a side effects or complications occurs, does not imply that the treatment was conducted incorrectly. It must be clearly understood that these can happen even when everything is done following the highest safety standards.  No treatment: You can choose not to proceed with the proposed treatment alternative. The "PRO(s)" would include: avoiding the risk of complications associated with the therapy. The "CON(s)" would include: not getting any of the treatment benefits. These benefits fall under one of three categories: diagnostic; therapeutic; and/or palliative. Diagnostic benefits include: getting information which can ultimately lead to improvement of the disease or symptom(s). Therapeutic benefits are those associated with the  successful treatment of the disease. Finally, palliative benefits are those related to the decrease of the primary symptoms, without necessarily curing the condition (example: decreasing the pain from a flare-up of a chronic condition, such as incurable terminal cancer).  General Risks and Complications: These are associated to most interventional treatments. They can occur alone, or in combination. They fall under one of the following six (6) categories: no benefit or worsening of symptoms; bleeding; infection; nerve damage; allergic reactions; and/or death. 1. No benefits or worsening of symptoms: In Medicine there are no guarantees, only probabilities. No healthcare provider can ever guarantee that a medical treatment will work, they can only state the probability that it may. Furthermore, there is always the possibility that the condition may worsen, either directly, or indirectly, as a consequence of the treatment. 2. Bleeding: This is more common if the patient is taking a blood thinner, either prescription or over the counter (example: Goody Powders, Fish oil, Aspirin, Garlic, etc.), or if suffering a condition associated with impaired coagulation (example: Hemophilia, cirrhosis of the liver, low platelet counts, etc.). However, even if you do not have one on these, it can still happen. If you have any of these conditions, or take one of these drugs, make sure to notify your treating physician. 3. Infection: This is more common in patients with a compromised immune system, either due to disease (example: diabetes, cancer, human immunodeficiency virus [HIV], etc.), or due to medications or treatments (example: therapies used to treat cancer and rheumatological diseases). However, even if you do not have one on these, it can still happen. If you have any of these conditions, or take one of these drugs, make sure to notify your treating physician. 4. Nerve Damage: This is more common when the   treatment is  an invasive one, but it can also happen with the use of medications, such as those used in the treatment of cancer. The damage can occur to small secondary nerves, or to large primary ones, such as those in the spinal cord and brain. This damage may be temporary or permanent and it may lead to impairments that can range from temporary numbness to permanent paralysis and/or brain death. 5. Allergic Reactions: Any time a substance or material comes in contact with our body, there is the possibility of an allergic reaction. These can range from a mild skin rash (contact dermatitis) to a severe systemic reaction (anaphylactic reaction), which can result in death. 6. Death: In general, any medical intervention can result in death, most of the time due to an unforeseen complication. ____________________________________________________________________________________________  ____________________________________________________________________________________________  Preparing for Procedure with Sedation  Procedure appointments are limited to planned procedures: . No Prescription Refills. . No disability issues will be discussed. . No medication changes will be discussed.  Instructions: . Oral Intake: Do not eat or drink anything for at least 3 hours prior to your procedure. (Exception: Blood Pressure Medication. See below.) . Transportation: Unless otherwise stated by your physician, you may drive yourself after the procedure. . Blood Pressure Medicine: Do not forget to take your blood pressure medicine with a sip of water the morning of the procedure. If your Diastolic (lower reading)is above 100 mmHg, elective cases will be cancelled/rescheduled. . Blood thinners: These will need to be stopped for procedures. Notify our staff if you are taking any blood thinners. Depending on which one you take, there will be specific instructions on how and when to stop it. . Diabetics on insulin: Notify the staff  so that you can be scheduled 1st case in the morning. If your diabetes requires high dose insulin, take only  of your normal insulin dose the morning of the procedure and notify the staff that you have done so. . Preventing infections: Shower with an antibacterial soap the morning of your procedure. . Build-up your immune system: Take 1000 mg of Vitamin C with every meal (3 times a day) the day prior to your procedure. Marland Kitchen Antibiotics: Inform the staff if you have a condition or reason that requires you to take antibiotics before dental procedures. . Pregnancy: If you are pregnant, call and cancel the procedure. . Sickness: If you have a cold, fever, or any active infections, call and cancel the procedure. . Arrival: You must be in the facility at least 30 minutes prior to your scheduled procedure. . Children: Do not bring children with you. . Dress appropriately: Bring dark clothing that you would not mind if they get stained. . Valuables: Do not bring any jewelry or valuables.  Reasons to call and reschedule or cancel your procedure: (Following these recommendations will minimize the risk of a serious complication.) . Surgeries: Avoid having procedures within 2 weeks of any surgery. (Avoid for 2 weeks before or after any surgery). . Flu Shots: Avoid having procedures within 2 weeks of a flu shots or . (Avoid for 2 weeks before or after immunizations). . Barium: Avoid having a procedure within 7-10 days after having had a radiological study involving the use of radiological contrast. (Myelograms, Barium swallow or enema study). . Heart attacks: Avoid any elective procedures or surgeries for the initial 6 months after a "Myocardial Infarction" (Heart Attack). . Blood thinners: It is imperative that you stop these medications before procedures. Let us know if you  if you take any blood thinner.  . Infection: Avoid procedures during or within two weeks of an infection (including chest colds or  gastrointestinal problems). Symptoms associated with infections include: Localized redness, fever, chills, night sweats or profuse sweating, burning sensation when voiding, cough, congestion, stuffiness, runny nose, sore throat, diarrhea, nausea, vomiting, cold or Flu symptoms, recent or current infections. It is specially important if the infection is over the area that we intend to treat. Marland Kitchen Heart and lung problems: Symptoms that may suggest an active cardiopulmonary problem include: cough, chest pain, breathing difficulties or shortness of breath, dizziness, ankle swelling, uncontrolled high or unusually low blood pressure, and/or palpitations. If you are experiencing any of these symptoms, cancel your procedure and contact your primary care physician for an evaluation.  Remember:  Regular Business hours are:  Monday to Thursday 8:00 AM to 4:00 PM  Provider's Schedule: Milinda Pointer, MD:  Procedure days: Tuesday and Thursday 7:30 AM to 4:00 PM  Gillis Santa, MD:  Procedure days: Monday and Wednesday 7:30 AM to 4:00 PM ____________________________________________________________________________________________  ____________________________________________________________________________________________  Preparing for Procedure with Sedation  Procedure appointments are limited to planned procedures: . No Prescription Refills. . No disability issues will be discussed. . No medication changes will be discussed.  Instructions: . Oral Intake: Do not eat or drink anything for at least 3 hours prior to your procedure. (Exception: Blood Pressure Medication. See below.) . Transportation: Unless otherwise stated by your physician, you may drive yourself after the procedure. . Blood Pressure Medicine: Do not forget to take your blood pressure medicine with a sip of water the morning of the procedure. If your Diastolic (lower reading)is above 100 mmHg, elective cases will be  cancelled/rescheduled. . Blood thinners: These will need to be stopped for procedures. Notify our staff if you are taking any blood thinners. Depending on which one you take, there will be specific instructions on how and when to stop it. . Diabetics on insulin: Notify the staff so that you can be scheduled 1st case in the morning. If your diabetes requires high dose insulin, take only  of your normal insulin dose the morning of the procedure and notify the staff that you have done so. . Preventing infections: Shower with an antibacterial soap the morning of your procedure. . Build-up your immune system: Take 1000 mg of Vitamin C with every meal (3 times a day) the day prior to your procedure. Marland Kitchen Antibiotics: Inform the staff if you have a condition or reason that requires you to take antibiotics before dental procedures. . Pregnancy: If you are pregnant, call and cancel the procedure. . Sickness: If you have a cold, fever, or any active infections, call and cancel the procedure. . Arrival: You must be in the facility at least 30 minutes prior to your scheduled procedure. . Children: Do not bring children with you. . Dress appropriately: Bring dark clothing that you would not mind if they get stained. . Valuables: Do not bring any jewelry or valuables.  Reasons to call and reschedule or cancel your procedure: (Following these recommendations will minimize the risk of a serious complication.) . Surgeries: Avoid having procedures within 2 weeks of any surgery. (Avoid for 2 weeks before or after any surgery). . Flu Shots: Avoid having procedures within 2 weeks of a flu shots or . (Avoid for 2 weeks before or after immunizations). . Barium: Avoid having a procedure within 7-10 days after having had a radiological study involving the use of radiological contrast. (  Myelograms, Barium swallow or enema study). . Heart attacks: Avoid any elective procedures or surgeries for the initial 6 months after a  "Myocardial Infarction" (Heart Attack). . Blood thinners: It is imperative that you stop these medications before procedures. Let us know if you if you take any blood thinner.  . Infection: Avoid procedures during or within two weeks of an infection (including chest colds or gastrointestinal problems). Symptoms associated with infections include: Localized redness, fever, chills, night sweats or profuse sweating, burning sensation when voiding, cough, congestion, stuffiness, runny nose, sore throat, diarrhea, nausea, vomiting, cold or Flu symptoms, recent or current infections. It is specially important if the infection is over the area that we intend to treat. Marland Kitchen Heart and lung problems: Symptoms that may suggest an active cardiopulmonary problem include: cough, chest pain, breathing difficulties or shortness of breath, dizziness, ankle swelling, uncontrolled high or unusually low blood pressure, and/or palpitations. If you are experiencing any of these symptoms, cancel your procedure and contact your primary care physician for an evaluation.  Remember:  Regular Business hours are:  Monday to Thursday 8:00 AM to 4:00 PM  Provider's Schedule: Milinda Pointer, MD:  Procedure days: Tuesday and Thursday 7:30 AM to 4:00 PM  Gillis Santa, MD:  Procedure days: Monday and Wednesday 7:30 AM to 4:00 PM ____________________________________________________________________________________________  ____________________________________________________________________________________________  Genicular Nerve Block  What is a genicular nerve block? A genicular nerve block is the injection of a local anesthetic to block the nerves that transmits pain from the knee.  What is the purpose of a facet nerve block? A genicular nerve block is a diagnostic procedure to determine if the pathologic changes (i.e. arthritis, meniscal tears, etc) and inflammation within the knee joint is the source of your knee pain. It  also confirms that the knee pain will respond well to the actual treatment procedure. If a genicular nerve block works, it will give you relief for several hours. After that, the pain is expected to return to normal. This test is always performed twice (usually a week or two apart) because two successful tests are required to move onto treatment. If both diagnostic tests are positive, then we schedule a treatment called radiofrequency (RF) ablation. In this procedure, the same nerves are cauterized, which typically leads to pain relief for 4 -18 months. If this process works well for one knee, it can be performed on the other knee if needed.  How is the procedure performed? You will be placed on the procedure table. The injection site is sterilized with either iodine or chlorhexadine. The site to be injected is numbed with a local anesthetic, and a needle is directed to the target area. X-ray guidance is used to ensure proper placement and positioning of the needle. When the needle is properly positioned near the genicular nerve, local anesthetic is injected to numb that nerve. This will be repeated at multiple sites around the knee to block all genicular nerves.  Will the procedure be painful? The injection can be painful and we therefore provide the option of receiving IV sedation. IV sedation, combined with local anesthetic, can make the injection nearly pain free. It allows you to remain very still during the procedure, which can also make the injection easier, faster, and more successful. If you decide to have IV sedation, you must have a driver to get you home safely afterwards. In addition, you cannot have anything to eat or drink within 8 hours of your appointment (clear liquids are allowed until 3  hours before the procedure). If you take medications for diabetes, these medications may need to be adjusted the morning of the procedure. Your primary care physician can help you with this  adjustment.  What are the discharge instructions? If you received IV sedation do not drive or operate machinery for at least 24 hours after the procedure. You may return to work the next day following your procedure. You may resume your normal diet immediately. Do not engage in any strenuous activity for 24 hours. You should, however, engage in moderate activity that typically causes your ususal pain. If the block works, those activities should not be painful for several hours after the injection. Do not take a bath, swim, or use a hot tub for 24 hours (you may take a shower). Call the office if you have any of the following: severe pain afterwards (different than your usual symptoms), redness/swelling/discharge at the injection site(s), fevers/chills, difficulty with bowel or bladder functions.  What are the risks and side effects? The complication rate for this procedure is very low. Whenever a needle enters the skin, bleeding or infection can occur. Some other serious but extremely rare risks include paralysis and death. You may have an allergic reaction to any of the medications used. If you have a known allergy to any medications, especially local anesthetics, notify our staff before the procedure takes place. You may experience any of the following side effects up to 4 - 6 hours after the procedure: . Leg muscle weakness or numbness may occur due to the local anesthetic affecting the nerves that control your legs (this is a temporary affect and it is not paralysis). If you have any leg weakness or numbness, walk only with assistance in order to prevent falls and injury. Your leg strength will return slowly and completely. . Dizziness may occur due to a decrease in your blood pressure. If this occurs, remain in a seated or lying position. Gradually sit up, and then stand after at least 10 minutes of sitting. . Mild headaches may occur. Drink fluids and take pain medications if needed. If the  headaches persist or become severe, call the office. . Mild discomfort at the injection site can occur. This typically lasts for a few hours but can persist for a couple days. If this occurs, take anti-inflammatories or pain medications, apply ice to the area the day of the procedure. If it persists, apply moist heat in the day(s) following.  The side effects listed above can be normal. They are not dangerous and will resolve on their own. If, however, you experience any of the following, a complication may have occurred and you should either contact your doctor. If he is not readily available, then you should proceed to the closest urgent care center for evaluation: . Severe or progressive pain at the injection site(s) . Arm or leg weakness that progressively worsens or persists for longer than 8 hours . Severe or progressive redness, swelling, or discharge from the injections site(s) . Fevers, chills, nausea, or vomiting . Bowel or bladder dysfunction (i.e. inability to urinate or pass stool or difficulty controlling either)  How long does it take for the procedure to work? You should feel relief from your usual pain within the first hour. Again, this is only expected to last for several hours, at the most. Remember, you may be sore in the middle part of your back from the needles, and you must distinguish this from your usual pain. ____________________________________________________________________________________________

## 2020-03-11 ENCOUNTER — Telehealth: Payer: Self-pay

## 2020-03-11 NOTE — Telephone Encounter (Signed)
Her insurance denied authorization for the genicular nerve block. They say for arthritis it would be ineffective. I notified the patient. Is there anything else you want to try to do?

## 2020-03-19 ENCOUNTER — Other Ambulatory Visit: Payer: Self-pay | Admitting: Internal Medicine

## 2020-03-19 DIAGNOSIS — G4700 Insomnia, unspecified: Secondary | ICD-10-CM

## 2020-03-19 MED ORDER — ZOLPIDEM TARTRATE 5 MG PO TABS: 5.0000 mg | ORAL_TABLET | Freq: Every evening | ORAL | 2 refills | Status: DC | PRN

## 2020-04-08 ENCOUNTER — Telehealth: Payer: Self-pay | Admitting: Internal Medicine

## 2020-04-08 ENCOUNTER — Other Ambulatory Visit: Payer: Self-pay

## 2020-04-08 NOTE — Telephone Encounter (Signed)
Patient states it is more of a heart beat sound in her ears. Scheduled for 04/11/20 at 3:00 pm in office.   Patient has been screened.

## 2020-04-08 NOTE — Telephone Encounter (Signed)
McLean-Scocuzza, Nino Glow, MD  You 4 days ago  TM Can we schedule appt for wife Aubrionna Holeman?   [employer]   Message text   Babs Bertin, CMA routed conversation to McLean-Scocuzza, Nino Glow, MD 4 days ago  Earney Mallet  McLean-Scocuzza, Nino Glow, MD 4 days ago  GG Hey Dr Aundra Dubin,, this isn't for me since my hard headed wife won't or forgets to message you.. She thinks that she is experiencing pulsatile tinnitus. She has a constant whooshing sound at night she has stated several times. If you could can you follow up with her. Thanks [employer]

## 2020-04-09 ENCOUNTER — Other Ambulatory Visit: Payer: Self-pay

## 2020-04-11 ENCOUNTER — Other Ambulatory Visit: Payer: Self-pay

## 2020-04-11 ENCOUNTER — Encounter: Payer: Self-pay | Admitting: Internal Medicine

## 2020-04-11 ENCOUNTER — Ambulatory Visit: Payer: BC Managed Care – PPO | Admitting: Internal Medicine

## 2020-04-11 VITALS — BP 130/82 | HR 61 | Temp 97.8°F | Ht 69.0 in | Wt 371.0 lb

## 2020-04-11 DIAGNOSIS — F4323 Adjustment disorder with mixed anxiety and depressed mood: Secondary | ICD-10-CM | POA: Diagnosis not present

## 2020-04-11 DIAGNOSIS — H9313 Tinnitus, bilateral: Secondary | ICD-10-CM

## 2020-04-11 DIAGNOSIS — G4700 Insomnia, unspecified: Secondary | ICD-10-CM

## 2020-04-11 DIAGNOSIS — F419 Anxiety disorder, unspecified: Secondary | ICD-10-CM | POA: Diagnosis not present

## 2020-04-11 DIAGNOSIS — Z6841 Body Mass Index (BMI) 40.0 and over, adult: Secondary | ICD-10-CM

## 2020-04-11 DIAGNOSIS — I10 Essential (primary) hypertension: Secondary | ICD-10-CM

## 2020-04-11 DIAGNOSIS — L680 Hirsutism: Secondary | ICD-10-CM

## 2020-04-11 DIAGNOSIS — F329 Major depressive disorder, single episode, unspecified: Secondary | ICD-10-CM

## 2020-04-11 DIAGNOSIS — F339 Major depressive disorder, recurrent, unspecified: Secondary | ICD-10-CM

## 2020-04-11 DIAGNOSIS — H6983 Other specified disorders of Eustachian tube, bilateral: Secondary | ICD-10-CM

## 2020-04-11 DIAGNOSIS — Z1231 Encounter for screening mammogram for malignant neoplasm of breast: Secondary | ICD-10-CM

## 2020-04-11 NOTE — Patient Instructions (Addendum)
Vaniqua cream  Laser hair  Or oral med below  Dermatology referral Dr. Lois Huxley in [place] *[place]   Try over the counter allergy pill (I.e zyrtec/claritin/allegra) at night regular no D  Flonase nasal spray as needed  Consider Ear Nose and throat send a message if not better   Liraglutide injection (Weight Management) What is this medicine? LIRAGLUTIDE (LIR a GLOO tide) is used to help people lose weight and maintain weight loss. It is used with a reduced-calorie diet and exercise. This medicine may be used for other purposes; ask your health care provider or pharmacist if you have questions. COMMON BRAND NAME(S): Saxenda What should I tell my health care provider before I take this medicine? They need to know if you have any of these conditions:  endocrine tumors (MEN 2) or if someone in your family had these tumors  gallbladder disease  high cholesterol  history of alcohol abuse problem  history of pancreatitis  kidney disease or if you are on dialysis  liver disease  previous swelling of the tongue, face, or lips with difficulty breathing, difficulty swallowing, hoarseness, or tightening of the throat  stomach problems  suicidal thoughts, plans, or attempt; a previous suicide attempt by you or a family member  thyroid cancer or if someone in your family had thyroid cancer  an unusual or allergic reaction to liraglutide, other medicines, foods, dyes, or preservatives  pregnant or trying to get pregnant  breast-feeding How should I use this medicine? This medicine is for injection under the skin of your upper leg, stomach area, or upper arm. You will be taught how to prepare and give this medicine. Use exactly as directed. Take your medicine at regular intervals. Do not take it more often than directed. This drug comes with INSTRUCTIONS FOR USE. Ask your pharmacist for directions on how to use this drug. Read the information carefully. Talk to your  pharmacist or health care provider if you have questions. It is important that you put your used needles and syringes in a special sharps container. Do not put them in a trash can. If you do not have a sharps container, call your pharmacist or healthcare provider to get one. A special MedGuide will be given to you by the pharmacist with each prescription and refill. Be sure to read this information carefully each time. Talk to your pediatrician regarding the use of this medicine in children. Special care may be needed. Overdosage: If you think you have taken too much of this medicine contact a poison control center or emergency room at once. NOTE: This medicine is only for you. Do not share this medicine with others. What if I miss a dose? If you miss a dose, take it as soon as you can. If it is almost time for your next dose, take only that dose. Do not take double or extra doses. If you miss your dose for 3 days or more, call your doctor or health care professional to talk about how to restart this medicine. What may interact with this medicine?  insulin and other medicines for diabetes This list may not describe all possible interactions. Give your health care provider a list of all the medicines, herbs, non-prescription drugs, or dietary supplements you use. Also tell them if you smoke, drink alcohol, or use illegal drugs. Some items may interact with your medicine. What should I watch for while using this medicine? Visit your doctor or health care professional for regular checks on your progress.  Drink plenty of fluids while taking this medicine. Check with your doctor or health care professional if you get an attack of severe diarrhea, nausea, and vomiting. The loss of too much body fluid can make it dangerous for you to take this medicine. This medicine may affect blood sugar levels. Ask your healthcare provider if changes in diet or medicines are needed if you have diabetes. Patients and  their families should watch out for worsening depression or thoughts of suicide. Also watch out for sudden changes in feelings such as feeling anxious, agitated, panicky, irritable, hostile, aggressive, impulsive, severely restless, overly excited and hyperactive, or not being able to sleep. If this happens, especially at the beginning of treatment or after a change in dose, call your health care professional. Women should inform their health care provider if they wish to become pregnant or think they might be pregnant. Losing weight while pregnant is not advised and may cause harm to the unborn child. Talk to your health care provider for more information. What side effects may I notice from receiving this medicine? Side effects that you should report to your doctor or health care professional as soon as possible:  allergic reactions like skin rash, itching or hives, swelling of the face, lips, or tongue  breathing problems  diarrhea that continues or is severe  lump or swelling on the neck  severe nausea  signs and symptoms of infection like fever or chills; cough; sore throat; pain or trouble passing urine  signs and symptoms of low blood sugar such as feeling anxious; confusion; dizziness; increased hunger; unusually weak or tired; increased sweating; shakiness; cold, clammy skin; irritable; headache; blurred vision; fast heartbeat; loss of consciousness  signs and symptoms of kidney injury like trouble passing urine or change in the amount of urine  trouble swallowing  unusual stomach upset or pain  vomiting Side effects that usually do not require medical attention (report to your doctor or health care professional if they continue or are bothersome):  constipation  decreased appetite  diarrhea  fatigue  headache  nausea  pain, redness, or irritation at site where injected  stomach upset  stuffy or runny nose This list may not describe all possible side effects.  Call your doctor for medical advice about side effects. You may report side effects to FDA at 1-800-FDA-1088. Where should I keep my medicine? Keep out of the reach of children. Store unopened pen in a refrigerator between 2 and 8 degrees C (36 and 46 degrees F). Do not freeze or use if the medicine has been frozen. Protect from light and excessive heat. After you first use the pen, it can be stored at room temperature between 15 and 30 degrees C (59 and 86 degrees F) or in a refrigerator. Throw away your used pen after 30 days or after the expiration date, whichever comes first. Do not store your pen with the needle attached. If the needle is left on, medicine may leak from the pen. NOTE: This sheet is a summary. It may not cover all possible information. If you have questions about this medicine, talk to your doctor, pharmacist, or health care provider.  2020 Elsevier/Gold Standard (2019-09-07 21:16:59)  Eustachian Tube Dysfunction  Eustachian tube dysfunction refers to a condition in which a blockage develops in the narrow passage that connects the middle ear to the back of the nose (eustachian tube). The eustachian tube regulates air pressure in the middle ear by letting air move between the ear and  nose. It also helps to drain fluid from the middle ear space. Eustachian tube dysfunction can affect one or both ears. When the eustachian tube does not function properly, air pressure, fluid, or both can build up in the middle ear. What are the causes? This condition occurs when the eustachian tube becomes blocked or cannot open normally. Common causes of this condition include:  Ear infections.  Colds and other infections that affect the nose, mouth, and throat (upper respiratory tract).  Allergies.  Irritation from cigarette smoke.  Irritation from stomach acid coming up into the esophagus (gastroesophageal reflux). The esophagus is the tube that carries food from the mouth to the  stomach.  Sudden changes in air pressure, such as from descending in an airplane or scuba diving.  Abnormal growths in the nose or throat, such as: ? Growths that line the nose (nasal polyps). ? Abnormal growth of cells (tumors). ? Enlarged tissue at the back of the throat (adenoids). What increases the risk? You are more likely to develop this condition if:  You smoke.  You are overweight.  You are a child who has: ? Certain birth defects of the mouth, such as cleft palate. ? Large tonsils or adenoids. What are the signs or symptoms? Common symptoms of this condition include:  A feeling of fullness in the ear.  Ear pain.  Clicking or popping noises in the ear.  Ringing in the ear.  Hearing loss.  Loss of balance.  Dizziness. Symptoms may get worse when the air pressure around you changes, such as when you travel to an area of high elevation, fly on an airplane, or go scuba diving. How is this diagnosed? This condition may be diagnosed based on:  Your symptoms.  A physical exam of your ears, nose, and throat.  Tests, such as those that measure: ? The movement of your eardrum (tympanogram). ? Your hearing (audiometry). How is this treated? Treatment depends on the cause and severity of your condition.  In mild cases, you may relieve your symptoms by moving air into your ears. This is called "popping the ears."  In more severe cases, or if you have symptoms of fluid in your ears, treatment may include: ? Medicines to relieve congestion (decongestants). ? Medicines that treat allergies (antihistamines). ? Nasal sprays or ear drops that contain medicines that reduce swelling (steroids). ? A procedure to drain the fluid in your eardrum (myringotomy). In this procedure, a small tube is placed in the eardrum to:  Drain the fluid.  Restore the air in the middle ear space. ? A procedure to insert a balloon device through the nose to inflate the opening of the  eustachian tube (balloon dilation). Follow these instructions at home: Lifestyle  Do not do any of the following until your health care provider approves: ? Travel to high altitudes. ? Fly in airplanes. ? Work in a Pension scheme manager or room. ? Scuba dive.  Do not use any products that contain nicotine or tobacco, such as cigarettes and e-cigarettes. If you need help quitting, ask your health care provider.  Keep your ears dry. Wear fitted earplugs during showering and bathing. Dry your ears completely after. General instructions  Take over-the-counter and prescription medicines only as told by your health care provider.  Use techniques to help pop your ears as recommended by your health care provider. These may include: ? Chewing gum. ? Yawning. ? Frequent, forceful swallowing. ? Closing your mouth, holding your nose closed, and gently blowing as  if you are trying to blow air out of your nose.  Keep all follow-up visits as told by your health care provider. This is important. Contact a health care provider if:  Your symptoms do not go away after treatment.  Your symptoms come back after treatment.  You are unable to pop your ears.  You have: ? A fever. ? Pain in your ear. ? Pain in your head or neck. ? Fluid draining from your ear.  Your hearing suddenly changes.  You become very dizzy.  You lose your balance. Summary  Eustachian tube dysfunction refers to a condition in which a blockage develops in the eustachian tube.  It can be caused by ear infections, allergies, inhaled irritants, or abnormal growths in the nose or throat.  Symptoms include ear pain, hearing loss, or ringing in the ears.  Mild cases are treated with maneuvers to unblock the ears, such as yawning or ear popping.  Severe cases are treated with medicines. Surgery may also be done (rare). This information is not intended to replace advice given to you by your health care provider. Make sure you  discuss any questions you have with your health care provider. Document Revised: 02/22/2018 Document Reviewed: 02/22/2018 Elsevier Patient Education  [place].  Spironolactone Oral Tablets What is this medicine? SPIRONOLACTONE (speer on oh LAK tone) is a diuretic. It helps you make more urine and to lose excess water from your body. This medicine is used to treat high blood pressure, and edema or swelling from heart, kidney, or liver disease. It is also used to treat patients who make too much aldosterone or have low potassium. This medicine may be used for other purposes; ask your health care provider or pharmacist if you have questions. COMMON BRAND NAME(S): Aldactone What should I tell my health care provider before I take this medicine? They need to know if you have any of these conditions:  high blood level of potassium  kidney disease or trouble making urine  liver disease  an unusual or allergic reaction to spironolactone, other medicines, foods, dyes, or preservatives  pregnant or trying to get pregnant  breast-feeding How should I use this medicine? Take this drug by mouth. Take it as directed on the prescription label at the same time every day. You can take it with or without food. You should always take it the same way. Keep taking it unless your health care provider tells you to stop. Talk to your health care provider about the use of this drug in children. Special care may be needed. Overdosage: If you think you have taken too much of this medicine contact a poison control center or emergency room at once. NOTE: This medicine is only for you. Do not share this medicine with others. What if I miss a dose? If you miss a dose, take it as soon as you can. If it is almost time for your next dose, take only that dose. Do not take double or extra doses. What may interact with this medicine? Do not take this medicine with any of the following  medications:  cidofovir  eplerenone  tranylcypromine This medicine may also interact with the following medications:  aspirin  certain medicines for blood pressure or heart disease like benazepril, lisinopril, losartan, valsartan  certain medicines that treat or prevent blood clots like heparin and enoxaparin  cholestyramine  cyclosporine  digoxin  lithium  medicines that relax muscles for surgery  NSAIDs, medicines for pain and inflammation, like  ibuprofen or naproxen  other diuretics  potassium supplements  steroid medicines like prednisone or cortisone  trimethoprim This list may not describe all possible interactions. Give your health care provider a list of all the medicines, herbs, non-prescription drugs, or dietary supplements you use. Also tell them if you smoke, drink alcohol, or use illegal drugs. Some items may interact with your medicine. What should I watch for while using this medicine? Visit your doctor or health care professional for regular checks on your progress. Check your blood pressure as directed. Ask your doctor what your blood pressure should be, and when you should contact them. You may need to be on a special diet while taking this medicine. Ask your doctor. Also, ask how many glasses of fluid you need to drink a day. You must not get dehydrated. This medicine may make you feel confused, dizzy or lightheaded. Drinking alcohol and taking some medicines can make this worse. Do not drive, use machinery, or do anything that needs mental alertness until you know how this medicine affects you. Do not sit or stand up quickly. What side effects may I notice from receiving this medicine? Side effects that you should report to your doctor or health care professional as soon as possible:  allergic reactions such as skin rash or itching, hives, swelling of the lips, mouth, tongue, or throat  black or tarry stools  fast, irregular  heartbeat  fever  muscle pain, cramps  numbness, tingling in hands or feet  trouble breathing  trouble passing urine  unusual bleeding  unusually weak or tired Side effects that usually do not require medical attention (report to your doctor or health care professional if they continue or are bothersome):  change in voice or hair growth  confusion  dizzy, drowsy  dry mouth, increased thirst  enlarged or tender breasts  headache  irregular menstrual periods  sexual difficulty, unable to have an erection  stomach upset This list may not describe all possible side effects. Call your doctor for medical advice about side effects. You may report side effects to FDA at 1-800-FDA-1088. Where should I keep my medicine? Keep out of the reach of children and pets. Store at room temperature between 20 and 25 degrees C (68 and 77 degrees F). Throw away any unused drug after the expiration date. NOTE: This sheet is a summary. It may not cover all possible information. If you have questions about this medicine, talk to your doctor, pharmacist, or health care provider.  2020 Elsevier/Gold Standard (2019-06-27 12:38:34)  Tinnitus Tinnitus refers to hearing a sound when there is no actual source for that sound. This is often described as ringing in the ears. However, people with this condition may hear a variety of noises, in one ear or in both ears. The sounds of tinnitus can be soft, loud, or somewhere in between. Tinnitus can last for a few seconds or can be constant for days. It may go away without treatment and come back at various times. When tinnitus is constant or happens often, it can lead to other problems, such as trouble sleeping and trouble concentrating. Almost everyone experiences tinnitus at some point. Tinnitus that is long-lasting (chronic) or comes back often (recurs) may require medical attention. What are the causes? The cause of tinnitus is often not known. In some  cases, it can result from:  Exposure to loud noises from machinery, music, or other sources.  An object (foreign body) stuck in the ear.  Earwax buildup.  Drinking alcohol or caffeine.  Taking certain medicines.  Age-related hearing loss. It may also be caused by medical conditions such as:  Ear or sinus infections.  High blood pressure.  Heart diseases.  Anemia.  Allergies.  Meniere's disease.  Thyroid problems.  Tumors.  A weak, bulging blood vessel (aneurysm) near the ear. What are the signs or symptoms? The main symptom of tinnitus is hearing a sound when there is no source for that sound. It may sound like:  Buzzing.  Roaring.  Ringing.  Blowing air.  Hissing.  Whistling.  Sizzling.  Humming.  Running water.  A musical note.  Tapping. Symptoms may affect only one ear (unilateral) or both ears (bilateral). How is this diagnosed? Tinnitus is diagnosed based on your symptoms, your medical history, and a physical exam. Your health care provider may do a thorough hearing test (audiologic exam) if your tinnitus:  Is unilateral.  Causes hearing difficulties.  Lasts 6 months or longer. You may work with a health care provider who specializes in hearing disorders (audiologist). You may be asked questions about your symptoms and how they affect your daily life. You may have other tests done, such as:  CT scan.  MRI.  An imaging test of how blood flows through your blood vessels (angiogram). How is this treated? Treating an underlying medical condition can sometimes make tinnitus go away. If your tinnitus continues, other treatments may include:  Medicines.  Therapy and counseling to help you manage the stress of living with tinnitus.  Sound generators to mask the tinnitus. These include: ? Tabletop sound machines that play relaxing sounds to help you fall asleep. ? Wearable devices that fit in your ear and play sounds or music. ? Acoustic  neural stimulation. This involves using headphones to listen to music that contains an auditory signal. Over time, listening to this signal may change some pathways in your brain and make you less sensitive to tinnitus. This treatment is used for very severe cases when no other treatment is working.  Using hearing aids or cochlear implants if your tinnitus is related to hearing loss. Hearing aids are worn in the outer ear. Cochlear implants are surgically placed in the inner ear. Follow these instructions at home: Managing symptoms      When possible, avoid being in loud places and being exposed to loud sounds.  Wear hearing protection, such as earplugs, when you are exposed to loud noises.  Use a white noise machine, a humidifier, or other devices to mask the sound of tinnitus.  Practice techniques for reducing stress, such as meditation, yoga, or deep breathing. Work with your health care provider if you need help with managing stress.  Sleep with your head slightly raised. This may reduce the impact of tinnitus. General instructions  Do not use stimulants, such as nicotine, alcohol, or caffeine. Talk with your health care provider about other stimulants to avoid. Stimulants are substances that can make you feel alert and attentive by increasing certain activities in the body (such as heart rate and blood pressure). These substances may make tinnitus worse.  Take over-the-counter and prescription medicines only as told by your health care provider.  Try to get plenty of sleep each night.  Keep all follow-up visits as told by your health care provider. This is important. Contact a health care provider if:  Your tinnitus continues for 3 weeks or longer without stopping.  You develop sudden hearing loss.  Your symptoms get worse or do not get  better with home care.  You feel you are not able to manage the stress of living with tinnitus. Get help right away if:  You develop  tinnitus after a head injury.  You have tinnitus along with any of the following: ? Dizziness. ? Loss of balance. ? Nausea and vomiting. ? Sudden, severe headache. These symptoms may represent a serious problem that is an emergency. Do not wait to see if the symptoms will go away. Get medical help right away. Call your local emergency services (911 in the [place] Do not drive yourself to the hospital. Summary  Tinnitus refers to hearing a sound when there is no actual source for that sound. This is often described as ringing in the ears.  Symptoms may affect only one ear (unilateral) or both ears (bilateral).  Use a white noise machine, a humidifier, or other devices to mask the sound of tinnitus.  Do not use stimulants, such as nicotine, alcohol, or caffeine. Talk with your health care provider about other stimulants to avoid. These substances may make tinnitus worse. This information is not intended to replace advice given to you by your health care provider. Make sure you discuss any questions you have with your health care provider. Document Revised: 05/17/2019 Document Reviewed: 08/12/2017 Elsevier Patient Education  2020 [employer].

## 2020-04-11 NOTE — Progress Notes (Signed)
Chief Complaint  Patient presents with  . Ear Problem    pt will hear a pounding, heart beat like rhytm in her ear when laying down or bending/moving. States she will have to settle and then it will go away. Pt has a history of hypertension.    F/u  1. Anxiety/depression/insomnia was on ambien 5 mg only sleeping 6 hours. Mood worse after mom died but now ok doing better did not like how prozac 20 mg made her feel and insurance did not cover Trintellix 5 mg and declines therapy and psych for now  Has been out of ambien 5 mg due to being out of town   2. Morbid obesity s/p gastric bypass has tried adipex multiple times since gastric bypass w/o relief on 02/07/2019 and 07/11/2019   3. Heart beat sensation in b/l ears mainly with lying down. Denies being lightheaded or dizzy at times Heart beat in ear gets fast and slow at times with standing now better. No whooshing sound or air in ear. Sx's worse 1 week ago which affects her sleep as well  4. Chronic pain right knee 2/2 severe arthritis changes failed cortisone injections x 3 and [place] cone injection visco injection. Not candidate for PT per [name] and saw pain clinic Dr. Lew Dawes but declined genicular nerve blood. Voltaren topical and oral failed and Tylenol and Advil  She has worsening #1 due to being declined pain clinic procedure  Messaged Dr. Holley Raring to see if anything he can do to get this approved  5. HTN on hctz 12.5 mg qd   Review of Systems  Constitutional: Negative for weight loss.  HENT: Negative for hearing loss.   Eyes: Negative for blurred vision.  Respiratory: Negative for shortness of breath.   Cardiovascular: Negative for chest pain.  Gastrointestinal: Negative for abdominal pain.  Musculoskeletal: Positive for joint pain.  Skin: Negative for rash.  Neurological: Negative for headaches.  Psychiatric/Behavioral: Negative for depression. The patient has insomnia. The patient is not nervous/anxious.    Past Medical  History:  Diagnosis Date  . Anemia   . Anxiety   . Depression   . Fibroid   . Hypertension   . Iron deficiency anemia   . Right leg DVT [facility])    Past Surgical History:  Procedure Laterality Date  . GASTRIC BYPASS     2007  . LAPAROSCOPIC TOTAL HYSTERECTOMY     removed cervix/uterus, removal of b/l tubes and ovary(s) fibroid 09/04/19 [facility] Dr. Delle Reining, Iona Coach MD; no need for future paps as of 11/22/19    Family History  Problem Relation Age of Onset  . Hypertension Mother   . Cancer Mother        brain  . Hyperlipidemia Father   . Diabetes Maternal Grandmother    Social History   Socioeconomic History  . Marital status: Married    Spouse name: Not on file  . Number of children: Not on file  . Years of education: Not on file  . Highest education level: Not on file  Occupational History  . Not on file  Tobacco Use  . Smoking status: Never Smoker  . Smokeless tobacco: Never Used  Substance and Sexual Activity  . Alcohol use: Not Currently  . Drug use: Not Currently  . Sexual activity: Yes    Comment: men  Other Topics Concern  . Not on file  Social History Narrative   Married Cytogeneticist    2 kids 63 and 66 y.o  as of 12/20/2018    Moved from [place] to [facility] to [place]    Owns guns, wears seat belt, safe in relationship    3 pregnancies 2 live births       Work at [employer] in [employer].    Social Determinants of Health   Financial Resource Strain:   . Difficulty of Paying Living Expenses:   Food Insecurity:   . Worried About Charity fundraiser in the Last Year:   . Arboriculturist in the Last Year:   Transportation Needs:   . Film/video editor (Medical):   Marland Kitchen Lack of Transportation (Non-Medical):   Physical Activity:   . Days of Exercise per Week:   . Minutes of Exercise per Session:   Stress:   . Feeling of Stress :   Social Connections:   . Frequency of Communication with Friends and Family:   . Frequency of Social Gatherings with Friends and  Family:   . Attends Religious Services:   . Active Member of Clubs or Organizations:   . Attends Archivist Meetings:   Marland Kitchen Marital Status:   Intimate Partner Violence:   . Fear of Current or Ex-Partner:   . Emotionally Abused:   Marland Kitchen Physically Abused:   . Sexually Abused:    Current Meds  Medication Sig  . diclofenac (VOLTAREN) 75 MG EC tablet Take 75 mg by mouth 2 (two) times daily.  . hydrochlorothiazide (HYDRODIURIL) 12.5 MG tablet Take 1 tablet (12.5 mg total) by mouth daily.  . [DISCONTINUED] zolpidem (AMBIEN) 5 MG tablet Take 1 tablet (5 mg total) by mouth at bedtime as needed for sleep.   Allergies  Allergen Reactions  . Trazodone And Nefazodone     Felt weird     Recent Results (from the past 2160 hour(s))  Hepatic function panel     Status: None   Collection Time: 01/26/20  8:07 AM  Result Value Ref Range   Total Bilirubin 0.5 0.2 - 1.2 mg/dL   Bilirubin, Direct 0.1 0.0 - 0.3 mg/dL   Alkaline Phosphatase 114 39 - 117 U/L   AST 22 0 - 37 U/L   ALT 19 0 - 35 U/L   Total Protein 7.0 6.0 - 8.3 g/dL   Albumin 3.8 3.5 - 5.2 g/dL  Iron, TIBC and Ferritin Panel     Status: Abnormal   Collection Time: 01/26/20  8:07 AM  Result Value Ref Range   Iron 88 40 - 190 mcg/dL   TIBC 258 250 - 450 mcg/dL (calc)   %SAT 34 16 - 45 % (calc)   Ferritin 429 (H) 16 - 232 ng/mL  TSH     Status: None   Collection Time: 01/26/20  8:07 AM  Result Value Ref Range   TSH 1.43 0.35 - 4.50 uIU/mL  Lipid panel     Status: Abnormal   Collection Time: 01/26/20  8:07 AM  Result Value Ref Range   Cholesterol 191 0 - 200 mg/dL    Comment: ATP III Classification       Desirable:  < 200 mg/dL               Borderline High:  200 - 239 mg/dL          High:  > = 240 mg/dL   Triglycerides 91.0 0.0 - 149.0 mg/dL    Comment: Normal:  <150 mg/dLBorderline High:  150 - 199 mg/dL   HDL 66.10 >39.00 mg/dL   VLDL  18.2 0.0 - 40.0 mg/dL   LDL Cholesterol 106 (H) 0 - 99 mg/dL   Total CHOL/HDL Ratio 3      Comment:                Men          Women1/2 Average Risk     3.4          3.3Average Risk          5.0          4.42X Average Risk          9.6          7.13X Average Risk          15.0          11.0                       NonHDL 124.50     Comment: NOTE:  Non-HDL goal should be 30 mg/dL higher than patient's LDL goal (i.e. LDL goal of < 70 mg/dL, would have non-HDL goal of < 100 mg/dL)   Objective  Body mass index is 54.79 kg/m. Wt Readings from Last 3 Encounters:  04/11/20 (!) 371 lb (168.3 kg)  03/05/20 (!) 376 lb (170.6 kg)  01/11/20 (!) 376 lb (170.6 kg)   Temp Readings from Last 3 Encounters:  04/11/20 97.8 F (36.6 C) (Temporal)  03/05/20 (!) 97.3 F (36.3 C) (Temporal)  01/11/20 (!) 96 F (35.6 C) (Temporal)   BP Readings from Last 3 Encounters:  04/11/20 130/82  03/05/20 (!) 151/97  01/11/20 130/82   Pulse Readings from Last 3 Encounters:  04/11/20 61  03/05/20 82  01/11/20 68    Physical Exam Vitals and nursing note reviewed.  Constitutional:      Appearance: Normal appearance. She is well-developed and well-groomed. She is morbidly obese.  HENT:     Head: Normocephalic and atraumatic.  Eyes:     Conjunctiva/sclera: Conjunctivae normal.     Pupils: Pupils are equal, round, and reactive to light.  Cardiovascular:     Rate and Rhythm: Normal rate and regular rhythm.     Heart sounds: Normal heart sounds. No murmur.  Pulmonary:     Effort: Pulmonary effort is normal.     Breath sounds: Normal breath sounds.  Skin:    General: Skin is warm and dry.  Neurological:     General: No focal deficit present.     Mental Status: She is alert and oriented to person, place, and time. Mental status is at baseline.     Gait: Gait normal.  Psychiatric:        Attention and Perception: Attention and perception normal.        Mood and Affect: Mood and affect normal.        Speech: Speech normal.        Behavior: Behavior normal. Behavior is cooperative.         Thought Content: Thought content normal.        Cognition and Memory: Cognition and memory normal.        Judgment: Judgment normal.     Assessment  Plan  Insomnia - Plan: zolpidem (AMBIEN CR) 6.25 MG CR tablet, DISCONTINUED: zolpidem 5 mg tablet Anxiety and depression, recurrent Adjustment disorder with mixed anxiety and depressed mood Pt declines meds for anxiety/depression did not like prozac, trazadone in past and ins did not cover Trintellex  Declines therapy   Morbid  obesity ([place]) Morbid obesity with BMI of 50.0-59.9, adult ([place]) Failed adipex multiple times in past s/p gastric bypass  Consider saxenda   Dysfunction of both eustachian tubes Tinnitus of both ears Consider otc antihistamine/flonase Consider ENT in the future  Essential hypertension  Cont meds  Hirsutism  -disc laser, dermatology, vaniqua in the future will let me know  Chronic right knee pain with abnormal MRI F/u pain clinic and [name]  Does not want to see Dr. Alvan Dame  CC Dr. Holley Raring to see if can get genicular nerve block approve d  HM Declines flu shot of not she had flu 2 weeks ago entire family. Last flu shot in 2018 -she may get in 2020  Tdap thinks had in 2018  LMP 12/09/2018, Pap 2018 OB/GYN in [place] get records Pap negative 10/11/17 neg pap neg HPV s/p hysterectomy  mammo 08/2019 negative and ordered   Former PCP Dr. Kathe Mariner [facility]  Get records [facility] and [place] seen 01/17/18 DVT right lower ext 11/13/2016 s/p Eliquis x 2 months and iron def anemia hbg 6.4, 7.6  -h/o low Hbg A2 1.6 range 1.8-3.2 may indicate alpha thal or iron def anemia  H/o elevated lfts AST 42 range was 14-36  Provider: Dr. Olivia Mackie McLean-Scocuzza-Internal Medicine

## 2020-04-12 DIAGNOSIS — L680 Hirsutism: Secondary | ICD-10-CM | POA: Insufficient documentation

## 2020-04-12 DIAGNOSIS — F4323 Adjustment disorder with mixed anxiety and depressed mood: Secondary | ICD-10-CM | POA: Insufficient documentation

## 2020-04-12 DIAGNOSIS — F339 Major depressive disorder, recurrent, unspecified: Secondary | ICD-10-CM | POA: Insufficient documentation

## 2020-04-12 MED ORDER — ZOLPIDEM TARTRATE ER 6.25 MG PO TBCR: 6.2500 mg | EXTENDED_RELEASE_TABLET | Freq: Every evening | ORAL | 2 refills | Status: DC | PRN

## 2020-04-12 MED ORDER — SAXENDA 18 MG/3ML ~~LOC~~ SOPN: PEN_INJECTOR | SUBCUTANEOUS | 11 refills | Status: DC

## 2020-04-12 MED ORDER — PEN NEEDLES 31G X 8 MM MISC: 1.0000 | Freq: Every day | 3 refills | Status: DC

## 2020-04-16 ENCOUNTER — Encounter: Payer: Self-pay | Admitting: Internal Medicine

## 2020-04-16 ENCOUNTER — Other Ambulatory Visit: Payer: Self-pay | Admitting: Internal Medicine

## 2020-04-16 ENCOUNTER — Ambulatory Visit: Payer: BC Managed Care – PPO | Admitting: Internal Medicine

## 2020-04-16 DIAGNOSIS — G4700 Insomnia, unspecified: Secondary | ICD-10-CM

## 2020-04-16 MED ORDER — ZOLPIDEM TARTRATE 5 MG PO TABS: 5.0000 mg | ORAL_TABLET | Freq: Every evening | ORAL | 1 refills | Status: DC | PRN

## 2020-04-18 ENCOUNTER — Telehealth: Payer: Self-pay | Admitting: Internal Medicine

## 2020-04-18 NOTE — Telephone Encounter (Signed)
Prior authorization has been submitted for patient's Saxenda and Ambien.  Awaiting approval or denial.

## 2020-04-23 ENCOUNTER — Other Ambulatory Visit: Payer: Self-pay | Admitting: Internal Medicine

## 2020-04-23 DIAGNOSIS — G4700 Insomnia, unspecified: Secondary | ICD-10-CM

## 2020-04-23 MED ORDER — ZOLPIDEM TARTRATE 5 MG PO TABS: 5.0000 mg | ORAL_TABLET | Freq: Every evening | ORAL | 5 refills | Status: DC | PRN

## 2020-04-23 NOTE — Telephone Encounter (Signed)
Sent ambien 5 mg qhs 6.25 cr denied

## 2020-04-23 NOTE — Telephone Encounter (Signed)
Prior Authorization for Zolpidem Tartrate 6.25mg  has been denied. Called to inform patient. Patient asks if we could go back to the 5mg  tablets instead of the 6.25.

## 2020-05-10 ENCOUNTER — Telehealth: Payer: Self-pay | Admitting: Internal Medicine

## 2020-05-10 NOTE — Telephone Encounter (Signed)
Faxed provider reconsideration(appeal)form drug requsted Saxenda 18MG /3ML pen-injectors Morbid(severe)obesity due to excess calories 05-10-20

## 2020-05-21 ENCOUNTER — Telehealth: Payer: Self-pay | Admitting: Internal Medicine

## 2020-05-21 NOTE — Telephone Encounter (Signed)
Patient states she has not been on either medication

## 2020-05-21 NOTE — Telephone Encounter (Signed)
Call pt has she been on qysymia or contrave in the past for weight loss?

## 2020-05-22 ENCOUNTER — Telehealth: Payer: Self-pay | Admitting: Internal Medicine

## 2020-05-22 NOTE — Telephone Encounter (Signed)
Faxed additional information required to [employer] and [employer] on 05-22-20

## 2020-06-06 ENCOUNTER — Encounter: Payer: Self-pay | Admitting: Internal Medicine

## 2020-06-06 DIAGNOSIS — I10 Essential (primary) hypertension: Secondary | ICD-10-CM

## 2020-06-07 ENCOUNTER — Other Ambulatory Visit: Payer: Self-pay

## 2020-06-07 DIAGNOSIS — K909 Intestinal malabsorption, unspecified: Secondary | ICD-10-CM

## 2020-06-07 DIAGNOSIS — E538 Deficiency of other specified B group vitamins: Secondary | ICD-10-CM

## 2020-06-07 MED ORDER — HYDROCHLOROTHIAZIDE 12.5 MG PO TABS: 12.5000 mg | ORAL_TABLET | Freq: Every day | ORAL | 1 refills | Status: DC

## 2020-06-10 ENCOUNTER — Inpatient Hospital Stay: Payer: BC Managed Care – PPO

## 2020-06-10 ENCOUNTER — Inpatient Hospital Stay: Payer: BC Managed Care – PPO | Attending: Internal Medicine

## 2020-06-10 ENCOUNTER — Inpatient Hospital Stay (HOSPITAL_BASED_OUTPATIENT_CLINIC_OR_DEPARTMENT_OTHER): Payer: BC Managed Care – PPO | Admitting: Internal Medicine

## 2020-06-10 ENCOUNTER — Other Ambulatory Visit: Payer: Self-pay

## 2020-06-10 ENCOUNTER — Encounter: Payer: Self-pay | Admitting: Internal Medicine

## 2020-06-10 DIAGNOSIS — Z808 Family history of malignant neoplasm of other organs or systems: Secondary | ICD-10-CM | POA: Insufficient documentation

## 2020-06-10 DIAGNOSIS — N920 Excessive and frequent menstruation with regular cycle: Secondary | ICD-10-CM | POA: Diagnosis not present

## 2020-06-10 DIAGNOSIS — K909 Intestinal malabsorption, unspecified: Secondary | ICD-10-CM

## 2020-06-10 DIAGNOSIS — Z90721 Acquired absence of ovaries, unilateral: Secondary | ICD-10-CM | POA: Diagnosis not present

## 2020-06-10 DIAGNOSIS — D508 Other iron deficiency anemias: Secondary | ICD-10-CM | POA: Diagnosis not present

## 2020-06-10 DIAGNOSIS — Z86718 Personal history of other venous thrombosis and embolism: Secondary | ICD-10-CM | POA: Insufficient documentation

## 2020-06-10 DIAGNOSIS — Z833 Family history of diabetes mellitus: Secondary | ICD-10-CM | POA: Insufficient documentation

## 2020-06-10 DIAGNOSIS — Z79899 Other long term (current) drug therapy: Secondary | ICD-10-CM | POA: Diagnosis not present

## 2020-06-10 DIAGNOSIS — E538 Deficiency of other specified B group vitamins: Secondary | ICD-10-CM | POA: Insufficient documentation

## 2020-06-10 DIAGNOSIS — Z9884 Bariatric surgery status: Secondary | ICD-10-CM | POA: Diagnosis not present

## 2020-06-10 DIAGNOSIS — Z8249 Family history of ischemic heart disease and other diseases of the circulatory system: Secondary | ICD-10-CM | POA: Insufficient documentation

## 2020-06-10 DIAGNOSIS — I10 Essential (primary) hypertension: Secondary | ICD-10-CM | POA: Diagnosis not present

## 2020-06-10 DIAGNOSIS — Z8349 Family history of other endocrine, nutritional and metabolic diseases: Secondary | ICD-10-CM | POA: Insufficient documentation

## 2020-06-10 LAB — COMPREHENSIVE METABOLIC PANEL
ALT: 39 U/L (ref 0–44)
AST: 35 U/L (ref 15–41)
Albumin: 3.6 g/dL (ref 3.5–5.0)
Alkaline Phosphatase: 90 U/L (ref 38–126)
Anion gap: 6 (ref 5–15)
BUN: 13 mg/dL (ref 6–20)
CO2: 27 mmol/L (ref 22–32)
Calcium: 7.7 mg/dL — ABNORMAL LOW (ref 8.9–10.3)
Chloride: 107 mmol/L (ref 98–111)
Creatinine, Ser: 0.87 mg/dL (ref 0.44–1.00)
GFR calc Af Amer: >60 mL/min (ref >60)
GFR calc non Af Amer: >60 mL/min (ref >60)
Glucose, Bld: 75 mg/dL (ref 70–99)
Potassium: 3.8 mmol/L (ref 3.5–5.1)
Sodium: 140 mmol/L (ref 135–145)
Total Bilirubin: 0.4 mg/dL (ref 0.3–1.2)
Total Protein: 7.1 g/dL (ref 6.5–8.1)

## 2020-06-10 LAB — CBC WITH DIFFERENTIAL/PLATELET
Abs Immature Granulocytes: 0.02 10*3/uL (ref 0.00–0.07)
Basophils Absolute: 0 10*3/uL (ref 0.0–0.1)
Basophils Relative: 0 %
Eosinophils Absolute: 0.1 10*3/uL (ref 0.0–0.5)
Eosinophils Relative: 1 %
HCT: 37.5 % (ref 36.0–46.0)
Hemoglobin: 12 g/dL (ref 12.0–15.0)
Immature Granulocytes: 0 %
Lymphocytes Relative: 37 %
Lymphs Abs: 2.3 10*3/uL (ref 0.7–4.0)
MCH: 28.6 pg (ref 26.0–34.0)
MCHC: 32 g/dL (ref 30.0–36.0)
MCV: 89.5 fL (ref 80.0–100.0)
Monocytes Absolute: 0.5 10*3/uL (ref 0.1–1.0)
Monocytes Relative: 8 %
Neutro Abs: 3.4 10*3/uL (ref 1.7–7.7)
Neutrophils Relative %: 54 %
Platelets: 306 10*3/uL (ref 150–400)
RBC: 4.19 MIL/uL (ref 3.87–5.11)
RDW: 14.1 % (ref 11.5–15.5)
WBC: 6.3 10*3/uL (ref 4.0–10.5)
nRBC: 0 % (ref 0.0–0.2)

## 2020-06-10 LAB — IRON AND TIBC
Iron: 54 ug/dL (ref 28–170)
Saturation Ratios: 19 % (ref 10.4–31.8)
TIBC: 284 ug/dL (ref 250–450)
UIBC: 230 ug/dL

## 2020-06-10 LAB — FERRITIN: Ferritin: 307 ng/mL (ref 11–307)

## 2020-06-10 MED ORDER — CYANOCOBALAMIN 1000 MCG/ML IJ SOLN
1000.0000 ug | Freq: Once | INTRAMUSCULAR | Status: AC
  Administered 2020-06-10: 1000 ug via INTRAMUSCULAR
  Filled 2020-06-10: qty 1

## 2020-06-10 NOTE — Assessment & Plan Note (Addendum)
#   Iron def anemia sec to iron malabsorption-/gastric bypass-menstrual blood loss.  Hemoglobin is 12.0.  Today iron studiespending  Patient is asymptomatic.  Hold iron infusion today.  # B12 deficiency gastric bypass-patient not on B12 sublingual.  Proceed with B12 injection today.   #History of DVT/calf-unprovoked s/p prophylactic Lovenox for the recent TAH uneventful.  # DISPOSITION: will call for ferrahem if severely low # B12 injection today;  NO Ferrahem.  # follow up in 6 months- MD; cbc/bmp/iron studies/ferritin/b12- -possible Ferrahem/B12 injection- dr.B

## 2020-06-10 NOTE — Progress Notes (Signed)
[facility] NOTE  Patient Care Team: McLean-Scocuzza, Nino Glow, MD as PCP - General (Internal Medicine)  CHIEF COMPLAINTS/PURPOSE OF CONSULTATION: Iron deficient anemia   HEMATOLOGY HISTORY  # IRON DEF ANEMIA- sec to gastric bypass [pcp- ferritin6/Hb-8 MCV 68; B12 -low; vit D-low]  # ? 2018 [[place]]Hx of right Calf x Eliquis 3 months  # gastric bypass [2007; [place]]   HISTORY OF PRESENTING ILLNESS:  Alyssa Cain 44 y.o.  female with a history of iron deficiency anemia secondary to gastric bypass status post IV iron infusion is here for follow-up.  Patient denies any blood in stools or black or stools.  No nausea vomiting.  States energy is adequate.  Review of Systems  Constitutional: Negative for chills, diaphoresis, fever and weight loss.  HENT: Negative for nosebleeds and sore throat.   Eyes: Negative for double vision.  Respiratory: Negative for cough, hemoptysis, sputum production, shortness of breath and wheezing.   Cardiovascular: Negative for chest pain, palpitations, orthopnea and leg swelling.  Gastrointestinal: Negative for abdominal pain, blood in stool, constipation, diarrhea, heartburn, melena, nausea and vomiting.  Genitourinary: Negative for dysuria, frequency and urgency.  Musculoskeletal: Negative for back pain and joint pain.  Skin: Negative.  Negative for itching and rash.  Neurological: Negative for dizziness, focal weakness, weakness and headaches.  Endo/Heme/Allergies: Does not bruise/bleed easily.  Psychiatric/Behavioral: Negative for depression. The patient is not nervous/anxious.     MEDICAL HISTORY:  Past Medical History:  Diagnosis Date  . Anemia   . Anxiety   . Depression   . Fibroid   . Hypertension   . Iron deficiency anemia   . Right leg DVT ([place])     SURGICAL HISTORY: Past Surgical History:  Procedure Laterality Date  . GASTRIC BYPASS     2007  . LAPAROSCOPIC TOTAL HYSTERECTOMY     removed  cervix/uterus, removal of b/l tubes and ovary(s) fibroid 09/04/19 [facility] Dr. Delle Reining, Iona Coach MD; no need for future paps as of 11/22/19     SOCIAL HISTORY: Social History   Socioeconomic History  . Marital status: Married    Spouse name: Not on file  . Number of children: Not on file  . Years of education: Not on file  . Highest education level: Not on file  Occupational History  . Not on file  Tobacco Use  . Smoking status: Never Smoker  . Smokeless tobacco: Never Used  Substance and Sexual Activity  . Alcohol use: Not Currently  . Drug use: Not Currently  . Sexual activity: Yes    Comment: men  Other Topics Concern  . 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    Social Determinants of Health   Financial Resource Strain:   . Difficulty of Paying Living Expenses:   Food Insecurity:   . Worried About Charity fundraiser in the Last Year:   . Arboriculturist in the Last Year:   Transportation Needs:   . Film/video editor (Medical):   Marland Kitchen Lack of Transportation (Non-Medical):   Physical Activity:   . Days of Exercise per Week:   . Minutes of Exercise per Session:  Stress:   . Feeling of Stress :   Social Connections:   . Frequency of Communication with Friends and Family:   . Frequency of Social Gatherings with Friends and Family:   . Attends Religious Services:   . Active Member of Clubs or Organizations:   . Attends Archivist Meetings:   Marland Kitchen Marital Status:   Intimate Partner Violence:   . Fear of Current or Ex-Partner:   . Emotionally Abused:   Marland Kitchen Physically Abused:   . Sexually Abused:     FAMILY HISTORY: Family History  Problem Relation Age of Onset  . Hypertension Mother   . Cancer Mother        brain  . Hyperlipidemia  Father   . Diabetes Maternal Grandmother     ALLERGIES:  is allergic to trazodone and nefazodone.  MEDICATIONS:  Current Outpatient Medications  Medication Sig Dispense Refill  . diclofenac (VOLTAREN) 75 MG EC tablet Take 75 mg by mouth 2 (two) times daily.    . hydrochlorothiazide (HYDRODIURIL) 12.5 MG tablet Take 1 tablet (12.5 mg total) by mouth daily. 90 tablet 1  . zolpidem (AMBIEN) 5 MG tablet Take 1 tablet (5 mg total) by mouth at bedtime as needed for sleep. 30 tablet 5   No current facility-administered medications for this visit.   Facility-Administered Medications Ordered in Other Visits  Medication Dose Route Frequency Provider Last Rate Last Admin  . cyanocobalamin ((VITAMIN B-12)) injection 1,000 mcg  1,000 mcg Intramuscular Once Cammie Sickle, MD          PHYSICAL EXAMINATION:   Vitals:   06/10/20 1345  BP: (!) 148/89  Pulse: 88  Resp: 20  Temp: 97.8 F (36.6 C)   Filed Weights   06/10/20 1351  Weight: (!) 380 lb (172.4 kg)    Physical Exam HENT:     Head: Normocephalic and atraumatic.     Mouth/Throat:     Pharynx: No oropharyngeal exudate.  Eyes:     Pupils: Pupils are equal, round, and reactive to light.  Cardiovascular:     Rate and Rhythm: Normal rate and regular rhythm.  Pulmonary:     Effort: No respiratory distress.     Breath sounds: No wheezing.  Abdominal:     General: Bowel sounds are normal. There is no distension.     Palpations: Abdomen is soft. There is no mass.     Tenderness: There is no abdominal tenderness. There is no guarding or rebound.  Musculoskeletal:        General: No tenderness. Normal range of motion.     Cervical back: Normal range of motion and neck supple.  Skin:    General: Skin is warm.  Neurological:     Mental Status: She is alert and oriented to person, place, and time.  Psychiatric:        Mood and Affect: Affect normal.     LABORATORY DATA:  I have reviewed the data as listed Lab Results   Component Value Date   WBC 6.3 06/10/2020   HGB 12.0 06/10/2020   HCT 37.5 06/10/2020   MCV 89.5 06/10/2020   PLT 306 06/10/2020   Recent Labs    08/10/19 1038 12/11/19 1328 01/26/20 0807 06/10/20 1325  NA 139 135  --  140  K 3.5 3.9  --  3.8  CL 104 102  --  107  CO2 26 25  --  27  GLUCOSE 113* 91  --  75  BUN 10  18  --  13  CREATININE 0.80 1.02*  --  0.87  CALCIUM 8.6* 8.4*  --  7.7*  GFRNONAA >60 >60  --  >60  GFRAA >60 >60  --  >60  PROT 7.2  --  7.0 7.1  ALBUMIN 3.7  --  3.8 3.6  AST 28  --  22 35  ALT 26  --  19 39  ALKPHOS 94  --  114 90  BILITOT 0.5  --  0.5 0.4  BILIDIR  --   --  0.1  --      No results found.  Malabsorption of iron # Iron def anemia sec to iron malabsorption-/gastric bypass-menstrual blood loss.  Hemoglobin is 12.0.  Today iron studiespending  Patient is asymptomatic.  Hold iron infusion today.  # B12 deficiency gastric bypass-patient not on B12 sublingual.  Proceed with B12 injection today.   #History of DVT/calf-unprovoked s/p prophylactic Lovenox for the recent TAH uneventful.  # DISPOSITION: will call for ferrahem if severely low # B12 injection today;  NO Ferrahem.  # follow up in 6 months- MD; cbc/bmp/iron studies/ferritin/b12- -possible Ferrahem/B12 injection- dr.B  All questions were answered. The patient knows to call the clinic with any problems, questions or concerns.    Cammie Sickle, MD 06/10/2020 2:14 PM

## 2020-06-11 ENCOUNTER — Telehealth: Payer: Self-pay | Admitting: Internal Medicine

## 2020-06-11 NOTE — Telephone Encounter (Signed)
On 7/27-spoke to patient; no evidence of iron deficiency.  Continue plan as discussed.

## 2020-06-12 ENCOUNTER — Telehealth: Payer: Self-pay | Admitting: Internal Medicine

## 2020-06-12 NOTE — Telephone Encounter (Signed)
Appeal to approve patient's SAXENDA has been denied.   Mychart sent to inform the patient.

## 2020-06-12 NOTE — Telephone Encounter (Signed)
Patient informed and verbalized understanding.  She is agreeable to referral

## 2020-06-12 NOTE — Telephone Encounter (Signed)
We can refer her to the weight loss clinic if she would like for medical management or consider surgery again if a candidate

## 2020-06-13 DIAGNOSIS — M1711 Unilateral primary osteoarthritis, right knee: Secondary | ICD-10-CM | POA: Diagnosis not present

## 2020-06-13 DIAGNOSIS — M7121 Synovial cyst of popliteal space [Baker], right knee: Secondary | ICD-10-CM | POA: Diagnosis not present

## 2020-06-13 DIAGNOSIS — M25461 Effusion, right knee: Secondary | ICD-10-CM | POA: Diagnosis not present

## 2020-06-13 DIAGNOSIS — M25561 Pain in right knee: Secondary | ICD-10-CM | POA: Diagnosis not present

## 2020-06-14 NOTE — Telephone Encounter (Signed)
[facility] in [place]

## 2020-06-14 NOTE — Addendum Note (Signed)
Addended by: Orland Mustard on: 06/14/2020 06:07 PM   Modules accepted: Orders

## 2020-07-04 ENCOUNTER — Encounter (INDEPENDENT_AMBULATORY_CARE_PROVIDER_SITE_OTHER): Payer: Self-pay | Admitting: Family Medicine

## 2020-07-04 ENCOUNTER — Other Ambulatory Visit: Payer: Self-pay

## 2020-07-04 ENCOUNTER — Ambulatory Visit (INDEPENDENT_AMBULATORY_CARE_PROVIDER_SITE_OTHER): Payer: BC Managed Care – PPO | Admitting: Family Medicine

## 2020-07-04 VITALS — BP 137/85 | HR 64 | Temp 98.1°F | Ht 70.0 in | Wt 372.0 lb

## 2020-07-04 DIAGNOSIS — Z9884 Bariatric surgery status: Secondary | ICD-10-CM

## 2020-07-04 DIAGNOSIS — I10 Essential (primary) hypertension: Secondary | ICD-10-CM | POA: Diagnosis not present

## 2020-07-04 DIAGNOSIS — Z1331 Encounter for screening for depression: Secondary | ICD-10-CM | POA: Diagnosis not present

## 2020-07-04 DIAGNOSIS — Z9189 Other specified personal risk factors, not elsewhere classified: Secondary | ICD-10-CM

## 2020-07-04 DIAGNOSIS — E8881 Metabolic syndrome: Secondary | ICD-10-CM | POA: Diagnosis not present

## 2020-07-04 DIAGNOSIS — M25561 Pain in right knee: Secondary | ICD-10-CM

## 2020-07-04 DIAGNOSIS — Z6841 Body Mass Index (BMI) 40.0 and over, adult: Secondary | ICD-10-CM

## 2020-07-04 DIAGNOSIS — D649 Anemia, unspecified: Secondary | ICD-10-CM | POA: Diagnosis not present

## 2020-07-04 DIAGNOSIS — R0602 Shortness of breath: Secondary | ICD-10-CM

## 2020-07-04 DIAGNOSIS — E65 Localized adiposity: Secondary | ICD-10-CM

## 2020-07-04 DIAGNOSIS — R5383 Other fatigue: Secondary | ICD-10-CM

## 2020-07-04 DIAGNOSIS — G8929 Other chronic pain: Secondary | ICD-10-CM

## 2020-07-04 DIAGNOSIS — Z0289 Encounter for other administrative examinations: Secondary | ICD-10-CM

## 2020-07-04 DIAGNOSIS — G4709 Other insomnia: Secondary | ICD-10-CM

## 2020-07-04 MED ORDER — INSULIN PEN NEEDLE 32G X 4 MM MISC: 1.0000 | 0 refills | Status: DC

## 2020-07-04 MED ORDER — OZEMPIC (0.25 OR 0.5 MG/DOSE) 2 MG/1.5ML ~~LOC~~ SOPN: 0.5000 mg | PEN_INJECTOR | SUBCUTANEOUS | 0 refills | Status: DC

## 2020-07-04 NOTE — Progress Notes (Signed)
Dear Dr. Terese Door,   Thank you for referring Penne Rosenstock to our clinic. The following note includes my evaluation and treatment recommendations.  Chief Complaint:   Alyssa Cain (MR# [id number]) is a 44 y.o. female who presents for evaluation and treatment of obesity and related comorbidities. Current BMI is Body mass index is 53.38 kg/m. Alyssa Cain has been struggling with her weight for many years and has been unsuccessful in either losing weight, maintaining weight loss, or reaching her healthy weight goal.  [place] is currently in the action stage of change and ready to dedicate time achieving and maintaining a healthier weight. Alyssa Cain is interested in becoming our patient and working on intensive lifestyle modifications including (but not limited to) diet and exercise for weight loss.  Alyssa Cain works for [employer] for 40 hours per week.  She is married and lives with her spouse, 3 children, father, and brother.  Alyssa Cain's habits were reviewed today and are as follows: Her family eats meals together, she thinks her family will eat healthier with her, her desired weight loss is 80+ pounds, she has been heavy most of her life, she started gaining weight at 44 years old, her heaviest weight ever was 450 pounds, she is a picky eater and doesn't like to eat healthier foods, she skips breakfast or lunch frequently and she is frequently drinking liquids with calories.  Depression Screen Alyssa Cain's Food and Mood (modified PHQ-9) score was 3.  Depression screen [facility] 2/9 07/04/2020  Decreased Interest 0  Down, Depressed, Hopeless 1  PHQ - 2 Score 1  Altered sleeping 0  Tired, decreased energy 1  Change in appetite 0  Feeling bad or failure about yourself  1  Trouble concentrating 0  Moving slowly or fidgety/restless 0  Suicidal thoughts 0  PHQ-9 Score 3  Difficult doing work/chores Not difficult at all   Subjective:   1. Other fatigue Alyssa Cain denies daytime  somnolence and denies waking up still tired. Patent has a history of symptoms of snoring. Alyssa Cain generally gets 6 hours of sleep per night, and states that she has generally restful sleep. Snoring is present. Apneic episodes are not present. Epworth Sleepiness Score is 4.  2. SOB (shortness of breath) on exertion Alyssa Cain notes increasing shortness of breath with exercising and seems to be worsening over time with weight gain. She notes getting out of breath sooner with activity than she used to. This has not gotten worse recently. Alyssa Cain denies shortness of breath at rest or orthopnea.  3. Anemia, unspecified type Status post hysterectomy.  Anemia resolved with an iron infusion.  CBC Latest Ref Rng & Units 06/10/2020 12/11/2019 08/10/2019  WBC 4.0 - 10.5 K/uL 6.3 6.0 6.1  Hemoglobin 12.0 - 15.0 g/dL 12.0 12.7 12.7  Hematocrit 36 - 46 % 37.5 41.4 40.4  Platelets 150 - 400 K/uL 306 296 297   Lab Results  Component Value Date   IRON 54 06/10/2020   TIBC 284 06/10/2020   FERRITIN 307 06/10/2020   Lab Results  Component Value Date   VITAMINB12 228 08/10/2019   4. Essential hypertension Review: taking medications as instructed, no medication side effects noted, no chest pain on exertion, no dyspnea on exertion, no swelling of ankles.   BP Readings from Last 3 Encounters:  07/04/20 137/85  06/10/20 (!) 148/89  04/11/20 130/82   5. Chronic pain of right knee Her knee pain makes it difficult to climb the stairs in her new house.  6. Other  insomnia Yasmine has difficulty sleeping.  She takes Ambien 5 mg at bedtime as needed.  7. Metabolic syndrome Risk factors (3 or more constitute Metabolic Syndrome): waist > 35" for female impaired fasting blood sugar on blood pressure medication.  8. Visceral obesity Zea's visceral fat score is 31.  9. H/O gastric bypass Andrianna has a history of gastric bypass in 2007.  She is not taking a multivitamin.  Her highest weight was over 450 pounds and  she got down to 300 pounds.  Her weight has been increasing for 10 years now.  10. Depression screening Alyssa Cain was screened for depression as part of her new patient workup.  PHQ-9 is 3.  11. At risk for heart disease Willadene is at a higher than average risk for cardiovascular disease due to obesity.   Assessment/Plan:   1. Other fatigue Elfa does not feel that her weight is causing her energy to be lower than it should be. Fatigue may be related to obesity, depression or many other causes. Labs will be ordered, and in the meanwhile, Kamerin will focus on self care including making healthy food choices, increasing physical activity and focusing on stress reduction.  - EKG 12-Lead  2. SOB (shortness of breath) on exertion Cartina does not feel that she gets out of breath more easily that she used to when she exercises. Iasia's shortness of breath appears to be obesity related and exercise induced. She has agreed to work on weight loss and gradually increase exercise to treat her exercise induced shortness of breath. Will continue to monitor closely.  3. Anemia The current medical regimen is effective;  continue present plan and medications.  Counseling . Iron is essential for our bodies to make red blood cells.  Reasons that someone may be deficient include: an iron-deficient diet (more likely in those following vegan or vegetarian diets), women with heavy menses, patients with GI disorders or poor absorption, patients that have had bariatric surgery, frequent blood donors, patients with cancer, and patients with heart disease.   [place] foods include dark leafy greens, red and white meats, eggs, seafood, and beans.   . Certain foods and drinks prevent your body from absorbing iron properly. Avoid eating these foods in the same meal as iron-rich foods or with iron supplements. These foods include: coffee, black tea, and red wine; milk, dairy products, and foods that are high in calcium;  beans and soybeans; whole grains.  . Constipation can be a side effect of iron supplementation. Increased water and fiber intake are helpful. Water goal: > 2 liters/day. Fiber goal: > 25 grams/day.  4. Essential hypertension Karmyn is working on healthy weight loss and exercise to improve blood pressure control. We will watch for signs of hypotension as she continues her lifestyle modifications.  5. Chronic pain of right knee Will follow because mobility and pain control are important for weight management.  6. Other insomnia Insufficient sleep is associated with obesity, DM2, CAD, HTN, and premature death. Metabolic and endocrine manifestations include decreased glucose tolerance, decreased insulin sensitivity, increased evening cortisol, increased ghrelin, and decreased leptin. Adults sleeping <= 5 h were 55% more likely to have obesity than those sleeping >5 hours. A reduction of 1 h of sleep is associated with 0.35kg/m2 increase in BMI. - Curr Obes Rep (2012) 1:245-256.  Counseling  Limit or avoid alcohol, caffeinated beverages, and cigarettes, especially close to bedtime.   Do not eat a large meal or eat spicy foods right before bedtime. This can  lead to digestive discomfort that can make it hard for you to sleep.  Keep a sleep diary to help you and your health care provider figure out what could be causing your insomnia.  . Make your bedroom a dark, comfortable place where it is easy to fall asleep. ? Put up shades or blackout curtains to block light from outside. ? Use a white noise machine to block noise. ? Keep the temperature cool. . Limit screen use before bedtime. This includes: ? Watching TV. ? Using your smartphone, tablet, or computer. . Stick to a routine that includes going to bed and waking up at the same times every day and night. This can help you fall asleep faster. Consider making a quiet activity, such as reading, part of your nighttime routine. . Try to avoid taking  naps during the day so that you sleep better at night. . Get out of bed if you are still awake after 15 minutes of trying to sleep. Keep the lights down, but try reading or doing a quiet activity. When you feel sleepy, go back to bed.  7. Metabolic syndrome Goal: Lose 7-10% of starting weight. Counseling: Intensive lifestyle modifications are the first line treatment for this issue. We discussed several lifestyle modifications today and she will continue to work on diet, exercise and weight loss efforts.   We have reviewed the risks and benefits of Ozempic. The patient denies a personal or family history of medullary thyroid cancer or MENII. The patient denies a history of pancreatitis. Alternative treatment options have been discussed. Patient understands that the use of Ozempic in a patient who does not have diabetes is considered off-label use. Patient has been advised of medications that are FDA-approved for obesity treatment. The potential risks and benefits of Ozempic were reviewed with the patient, and alternative treatment options were discussed. All questions were answered, and the patient wishes to move forward with this medication.  Starting dose: ? 0.25 mg [place] once weekly x 4 weeks ? Then 0.5 mg [place] once weekly x 4 week ? Then 1 mg [place] once weekly  - Insulin, random - Semaglutide,0.25 or 0.5MG /DOS, (OZEMPIC, 0.25 OR 0.5 MG/DOSE,) 2 MG/1.5ML SOPN; Inject 0.375 mLs (0.5 mg total) into the skin once a week.  Dispense: 1.5 mL; Refill: 0 - Insulin Pen Needle 32G X 4 MM MISC; 1 each by Does not apply route once a week.  Dispense: 50 each; Refill: 0  8. Visceral obesity Will continue to monitor.  20. H/O gastric bypass Jaeline is at risk for malnutrition due to her previous bariatric surgery.   Counseling  You may need to eat 3 meals and 2 snacks, or 5 small meals each day in order to reach your protein and calorie goals.   Allow at least 15 minutes for each meal so that you can eat  mindfully. Listen to your body so that you do not overeat. For most people, your sleeve or pouch will comfortably hold 4-6 ounces.  Eat foods from all food groups. This includes fruits and vegetables, grains, dairy, and meat and other proteins.  Include a protein-rich food at every meal and snack, and eat the protein food first.   You should be taking a Bariatric Multivitamin as well as calcium.   10. Depression screening Depression screen is negative.  11. At risk for heart disease Alyssa Cain was given approximately 15 minutes of coronary artery disease prevention counseling today. She is 44 y.o. female and has risk factors for heart  disease including obesity. We discussed intensive lifestyle modifications today with an emphasis on specific weight loss instructions and strategies.   12. Class 3 severe obesity with serious comorbidity and body mass index (BMI) greater than or equal to 70 in adult, unspecified obesity type (HCC) Alyssa Cain is currently in the action stage of change and her goal is to continue with weight loss efforts. I recommend Arvie begin the structured treatment plan as follows:  She has agreed to the Category 3 Plan.  Protein equivalents were reviewed due to decreased stomach volume.  Exercise goals: Swimming   Behavioral modification strategies: increasing lean protein intake, decreasing simple carbohydrates, increasing vegetables, increasing water intake, decreasing liquid calories, decreasing alcohol intake, decreasing sodium intake and increasing high fiber foods.  She was informed of the importance of frequent follow-up visits to maximize her success with intensive lifestyle modifications for her multiple health conditions. She was informed we would discuss her lab results at her next visit unless there is a critical issue that needs to be addressed sooner. Jahari agreed to keep her next visit at the agreed upon time to discuss these results.  Objective:   Blood pressure  137/85, pulse 64, temperature 98.1 F (36.7 C), temperature source Oral, height 5\' 10"  (1.778 m), weight (!) 372 lb (168.7 kg), SpO2 97 %. Body mass index is 53.38 kg/m.  EKG: Normal sinus rhythm, rate 66 bpm.  Indirect Calorimeter completed today shows a VO2 of 321 and a REE of 2231.  Her calculated basal metabolic rate is 0254 thus her basal metabolic rate is better than expected.  General: Cooperative, alert, well developed, in no acute distress. HEENT: Conjunctivae and lids unremarkable. Cardiovascular: Regular rhythm.  Lungs: Normal work of breathing. Neurologic: No focal deficits.   Lab Results  Component Value Date   CREATININE 0.87 06/10/2020   BUN 13 06/10/2020   NA 140 06/10/2020   K 3.8 06/10/2020   CL 107 06/10/2020   CO2 27 06/10/2020   Lab Results  Component Value Date   ALT 39 06/10/2020   AST 35 06/10/2020   ALKPHOS 90 06/10/2020   BILITOT 0.4 06/10/2020   Lab Results  Component Value Date   HGBA1C 5.5 12/23/2018   Lab Results  Component Value Date   TSH 1.43 01/26/2020   Lab Results  Component Value Date   CHOL 191 01/26/2020   HDL 66.10 01/26/2020   LDLCALC 106 (H) 01/26/2020   TRIG 91.0 01/26/2020   CHOLHDL 3 01/26/2020   Lab Results  Component Value Date   WBC 6.3 06/10/2020   HGB 12.0 06/10/2020   HCT 37.5 06/10/2020   MCV 89.5 06/10/2020   PLT 306 06/10/2020   Lab Results  Component Value Date   IRON 54 06/10/2020   TIBC 284 06/10/2020   FERRITIN 307 06/10/2020   Attestation Statements:   This is the patient's first visit at [facility]. The patient's NEW PATIENT PACKET was reviewed at length. Included in the packet: current and past health history, medications, allergies, ROS, gynecologic history (women only), surgical history, family history, social history, weight history, weight loss surgery history (for those that have had weight loss surgery), nutritional evaluation, mood and food questionnaire, PHQ9, Epworth  questionnaire, sleep habits questionnaire, patient life and health improvement goals questionnaire. These will all be scanned into the patient's chart under media.   During the visit, I independently reviewed the patient's EKG, bioimpedance scale results, and indirect calorimeter results. I used this information to tailor a  meal plan for the patient that will help her to lose weight and will improve her obesity-related conditions going forward. I performed a medically necessary appropriate examination and/or evaluation. I discussed the assessment and treatment plan with the patient. The patient was provided an opportunity to ask questions and all were answered. The patient agreed with the plan and demonstrated an understanding of the instructions. Labs were ordered at this visit and will be reviewed at the next visit unless more critical results need to be addressed immediately. Clinical information was updated and documented in the EMR.   I, Water quality scientist, CMA, am acting as transcriptionist for Briscoe Deutscher, DO  I have reviewed the above documentation for accuracy and completeness, and I agree with the above. Briscoe Deutscher, DO

## 2020-07-05 LAB — INSULIN, RANDOM: INSULIN: 11.8 u[IU]/mL (ref 2.6–24.9)

## 2020-07-18 ENCOUNTER — Other Ambulatory Visit: Payer: Self-pay

## 2020-07-18 ENCOUNTER — Ambulatory Visit (INDEPENDENT_AMBULATORY_CARE_PROVIDER_SITE_OTHER): Payer: BC Managed Care – PPO | Admitting: Family Medicine

## 2020-07-18 ENCOUNTER — Encounter (INDEPENDENT_AMBULATORY_CARE_PROVIDER_SITE_OTHER): Payer: Self-pay | Admitting: Family Medicine

## 2020-07-18 VITALS — BP 116/79 | HR 90 | Temp 97.6°F | Ht 70.0 in | Wt 366.0 lb

## 2020-07-18 DIAGNOSIS — Z9189 Other specified personal risk factors, not elsewhere classified: Secondary | ICD-10-CM | POA: Diagnosis not present

## 2020-07-18 DIAGNOSIS — E65 Localized adiposity: Secondary | ICD-10-CM

## 2020-07-18 DIAGNOSIS — E8881 Metabolic syndrome: Secondary | ICD-10-CM | POA: Diagnosis not present

## 2020-07-18 DIAGNOSIS — I10 Essential (primary) hypertension: Secondary | ICD-10-CM | POA: Diagnosis not present

## 2020-07-18 DIAGNOSIS — Z9884 Bariatric surgery status: Secondary | ICD-10-CM

## 2020-07-18 DIAGNOSIS — Z6841 Body Mass Index (BMI) 40.0 and over, adult: Secondary | ICD-10-CM

## 2020-07-18 MED ORDER — OZEMPIC (1 MG/DOSE) 4 MG/3ML ~~LOC~~ SOPN: 1.0000 mg | PEN_INJECTOR | SUBCUTANEOUS | 0 refills | Status: DC

## 2020-07-18 NOTE — Progress Notes (Signed)
Chief Complaint:   OBESITY Alyssa Cain is here to discuss her progress with her obesity treatment plan along with follow-up of her obesity related diagnoses. Alyssa Cain is on the Category 3 Plan and states she is following her eating plan approximately 80+% of the time. Alyssa Cain states she has increased her walking.  Today's visit was #: 2 Starting weight: 372 lbs Starting date: 07/04/2020 Today's weight: 366 lbs Today's date: 07/18/2020 Total lbs lost to date: 6 lbs Total lbs lost since last in-office visit: 6 lbs Total weight loss percentage to date: -1.61%  Interim History: Alyssa Cain says she started semaglutide, tolerating it and finding benefit in terms of appetite control.  She has increased her water intake and is adhering to the meal plan.   Current Outpatient Medications:  .  DICLOFENAC SODIUM PO, Take 60 mg by mouth 2 (two) times daily. , Disp: , Rfl:  .  hydrochlorothiazide (HYDRODIURIL) 12.5 MG tablet, Take 1 tablet (12.5 mg total) by mouth daily., Disp: 90 tablet, Rfl: 1 .  Insulin Pen Needle 32G X 4 MM MISC, 1 each by Does not apply route once a week., Disp: 50 each, Rfl: 0 .  zolpidem (AMBIEN) 5 MG tablet, Take 1 tablet (5 mg total) by mouth at bedtime as needed for sleep., Disp: 30 tablet, Rfl: 5 .  Semaglutide, 1 MG/DOSE, (OZEMPIC, 1 MG/DOSE,) 4 MG/3ML SOPN, Inject 0.75 mLs (1 mg total) into the skin once a week., Disp: 3 mL, Rfl: 0  Assessment/Plan:   1. Essential hypertension Improved to goal. Diet: Avoid buying foods that are: processed, frozen, or prepackaged to avoid excess salt. The patient understands monitoring parameters and red flags. We will watch for signs of hypotension as she continues her lifestyle modifications.   BP Readings from Last 3 Encounters:  07/18/20 116/79  07/04/20 137/85  06/10/20 (!) 148/89   2. Visceral obesity Visceral adipose tissue is a hormonally active component of total body fat. This body composition phenotype is associated with medical  disorders such as metabolic syndrome, cardiovascular disease and several malignancies including prostate, breast, and colorectal cancers. Goal: Lose 7-10% of starting weight. Visceral fat rating should be < 13.  3. Metabolic syndrome Goal: Lose 7-10% of starting weight. Counseling: Intensive lifestyle modifications are the first line treatment for this issue. We discussed several lifestyle modifications today and she will continue to work on diet, exercise and weight loss efforts.   -Refill Semaglutide, 1 MG/DOSE, (OZEMPIC, 1 MG/DOSE,) 4 MG/3ML SOPN; Inject 0.75 mLs (1 mg total) into the skin once a week.  Dispense: 3 mL; Refill: 0  4. H/O gastric bypass Alyssa Cain is at risk for malnutrition due to her previous bariatric surgery.   Counseling  You may need to eat 3 meals and 2 snacks, or 5 small meals each day in order to reach your protein and calorie goals.   Allow at least 15 minutes for each meal so that you can eat mindfully. Listen to your body so that you do not overeat. For most people, your sleeve or pouch will comfortably hold 4-6 ounces.  Eat foods from all food groups. This includes fruits and vegetables, grains, dairy, and meat and other proteins.  Include a protein-rich food at every meal and snack, and eat the protein food first.   You should be taking a Bariatric Multivitamin as well as calcium.   5. At risk for heart disease Alyssa Cain was given approximately 15 minutes of coronary artery disease prevention counseling today. She is 44  y.o. female and has risk factors for heart disease including obesity and metabolic syndrome/visceral obesity. We discussed intensive lifestyle modifications today with an emphasis on specific weight loss instructions and strategies.   During insulin resistance, several metabolic alterations induce the development of cardiovascular disease. For instance, insulin resistance can induce an imbalance in glucose metabolism that generates chronic  hyperglycemia, which in turn triggers oxidative stress and causes an inflammatory response that leads to cell damage. Insulin resistance can also alter systemic lipid metabolism which then leads to the development of dyslipidemia and the well-known lipid triad: (1) high levels of plasma triglycerides, (2) low levels of high-density lipoprotein, and (3) the appearance of small dense low-density lipoproteins. This triad, along with endothelial dysfunction, which can also be induced by aberrant insulin signaling, contribute to atherosclerotic plaque formation.   6. Class 3 severe obesity with serious comorbidity and body mass index (BMI) of 50.0 to 59.9 in adult, unspecified obesity type (HCC) Alyssa Cain is currently in the action stage of change. As such, her goal is to continue with weight loss efforts. She has agreed to the Category 3 Plan.   Exercise goals: For substantial health benefits, adults should do at least 150 minutes (2 hours and 30 minutes) a week of moderate-intensity, or 75 minutes (1 hour and 15 minutes) a week of vigorous-intensity aerobic physical activity, or an equivalent combination of moderate- and vigorous-intensity aerobic activity. Aerobic activity should be performed in episodes of at least 10 minutes, and preferably, it should be spread throughout the week.  Behavioral modification strategies: increasing lean protein intake.  Alyssa Cain has agreed to follow-up with our clinic in 3 weeks. She was informed of the importance of frequent follow-up visits to maximize her success with intensive lifestyle modifications for her multiple health conditions.   Objective:   Blood pressure 116/79, pulse 90, temperature 97.6 F (36.4 C), temperature source Oral, height 5\' 10"  (1.778 m), weight (!) 366 lb (166 kg), SpO2 96 %. Body mass index is 52.52 kg/m.  General: Cooperative, alert, well developed, in no acute distress. HEENT: Conjunctivae and lids unremarkable. Cardiovascular: Regular  rhythm.  Lungs: Normal work of breathing. Neurologic: No focal deficits.   Lab Results  Component Value Date   CREATININE 0.87 06/10/2020   BUN 13 06/10/2020   NA 140 06/10/2020   K 3.8 06/10/2020   CL 107 06/10/2020   CO2 27 06/10/2020   Lab Results  Component Value Date   ALT 39 06/10/2020   AST 35 06/10/2020   ALKPHOS 90 06/10/2020   BILITOT 0.4 06/10/2020   Lab Results  Component Value Date   HGBA1C 5.5 12/23/2018   Lab Results  Component Value Date   INSULIN 11.8 07/04/2020   Lab Results  Component Value Date   TSH 1.43 01/26/2020   Lab Results  Component Value Date   CHOL 191 01/26/2020   HDL 66.10 01/26/2020   LDLCALC 106 (H) 01/26/2020   TRIG 91.0 01/26/2020   CHOLHDL 3 01/26/2020   Lab Results  Component Value Date   WBC 6.3 06/10/2020   HGB 12.0 06/10/2020   HCT 37.5 06/10/2020   MCV 89.5 06/10/2020   PLT 306 06/10/2020   Lab Results  Component Value Date   IRON 54 06/10/2020   TIBC 284 06/10/2020   FERRITIN 307 06/10/2020   Attestation Statements:   Reviewed by clinician on day of visit: allergies, medications, problem list, medical history, surgical history, family history, social history, and previous encounter notes.  I, Water quality scientist,  CMA, am acting as transcriptionist for Briscoe Deutscher, DO  I have reviewed the above documentation for accuracy and completeness, and I agree with the above. Briscoe Deutscher, DO

## 2020-07-22 ENCOUNTER — Encounter: Payer: Self-pay | Admitting: Internal Medicine

## 2020-07-24 ENCOUNTER — Ambulatory Visit: Payer: BC Managed Care – PPO | Admitting: Internal Medicine

## 2020-07-24 ENCOUNTER — Encounter: Payer: Self-pay | Admitting: Internal Medicine

## 2020-08-01 ENCOUNTER — Other Ambulatory Visit: Payer: Self-pay

## 2020-08-01 ENCOUNTER — Ambulatory Visit (INDEPENDENT_AMBULATORY_CARE_PROVIDER_SITE_OTHER): Payer: BC Managed Care – PPO | Admitting: Family Medicine

## 2020-08-01 ENCOUNTER — Encounter (INDEPENDENT_AMBULATORY_CARE_PROVIDER_SITE_OTHER): Payer: Self-pay | Admitting: Family Medicine

## 2020-08-01 VITALS — BP 123/84 | HR 66 | Temp 98.0°F | Ht 70.0 in | Wt 358.0 lb

## 2020-08-01 DIAGNOSIS — I10 Essential (primary) hypertension: Secondary | ICD-10-CM | POA: Diagnosis not present

## 2020-08-01 DIAGNOSIS — E65 Localized adiposity: Secondary | ICD-10-CM

## 2020-08-01 DIAGNOSIS — Z6841 Body Mass Index (BMI) 40.0 and over, adult: Secondary | ICD-10-CM

## 2020-08-01 DIAGNOSIS — E8881 Metabolic syndrome: Secondary | ICD-10-CM

## 2020-08-01 MED ORDER — OZEMPIC (1 MG/DOSE) 4 MG/3ML ~~LOC~~ SOPN: 1.0000 mg | PEN_INJECTOR | SUBCUTANEOUS | 0 refills | Status: DC

## 2020-08-02 ENCOUNTER — Telehealth (INDEPENDENT_AMBULATORY_CARE_PROVIDER_SITE_OTHER): Payer: BC Managed Care – PPO | Admitting: Internal Medicine

## 2020-08-02 ENCOUNTER — Encounter: Payer: Self-pay | Admitting: Internal Medicine

## 2020-08-02 VITALS — BP 123/84 | Ht 70.0 in | Wt 358.0 lb

## 2020-08-02 DIAGNOSIS — M25562 Pain in left knee: Secondary | ICD-10-CM | POA: Diagnosis not present

## 2020-08-02 DIAGNOSIS — Z6841 Body Mass Index (BMI) 40.0 and over, adult: Secondary | ICD-10-CM

## 2020-08-02 DIAGNOSIS — G4700 Insomnia, unspecified: Secondary | ICD-10-CM | POA: Diagnosis not present

## 2020-08-02 DIAGNOSIS — G8929 Other chronic pain: Secondary | ICD-10-CM | POA: Diagnosis not present

## 2020-08-02 MED ORDER — ZOLPIDEM TARTRATE ER 6.25 MG PO TBCR: 6.2500 mg | EXTENDED_RELEASE_TABLET | Freq: Every evening | ORAL | 0 refills | Status: DC | PRN

## 2020-08-02 NOTE — Progress Notes (Signed)
Virtual Visit via Video Note  I connected with Alyssa Cain  on 08/02/20 at 10:30 AM EDT by a video enabled telemedicine application and verified that I am speaking with the correct person using two identifiers.  Location patient: home Location provider:work or home office Persons participating in the virtual visit: patient, provider  I discussed the limitations of evaluation and management by telemedicine and the availability of in person appointments. The patient expressed understanding and agreed to proceed.   HPI: 1. Morbid obesity down 14 lbs with wt loss clinic muscle mass 135.2 ideal 220-230 lbs on ozempic 1 mg weekly x 2 weeks and doing 1500 calorie diet but at times not eating that much. Denies nausea with this med. She stopped taking hctz due to DDI hctz bp running 123/84 will monitor  She is not currently exercising due to knee pain thinking about [facility] for water aerobics  2. Insomnia sleeping 5-6 hours on ambien 5 mg qhs insurance would not cover 6.25 mg qhs but still not sleeping  3. C/o left knee pain agreeable to Xray left knee she is also having tightness in her calves  Tried voltaren gel w/o much relief  4. ETD not as bad hold ENT [place] for now   ROS: See pertinent positives and negatives per HPI.  Past Medical History:  Diagnosis Date  . Anemia   . Anxiety   . Depression   . Fibroid   . History of blood clots   . Hypertension   . Iron deficiency anemia   . Right leg DVT [facility])     Past Surgical History:  Procedure Laterality Date  . GASTRIC BYPASS     2007  . LAPAROSCOPIC TOTAL HYSTERECTOMY     removed cervix/uterus, removal of b/l tubes and ovary(s) fibroid 09/04/19 [facility] Dr. Delle Reining, [place] Coach MD; no need for future paps as of 11/22/19     Family History  Problem Relation Age of Onset  . Hypertension Mother   . Cancer Mother        brain  . Hyperlipidemia Mother   . Obesity Mother   . Hyperlipidemia Father   . Diabetes  Maternal Grandmother     SOCIAL HX: married with kids  Current Outpatient Medications:  .  DICLOFENAC SODIUM PO, Take 60 mg by mouth 2 (two) times daily. , Disp: , Rfl:  .  Insulin Pen Needle 32G X 4 MM MISC, 1 each by Does not apply route once a week., Disp: 50 each, Rfl: 0 .  Semaglutide, 1 MG/DOSE, (OZEMPIC, 1 MG/DOSE,) 4 MG/3ML SOPN, Inject 1 mg into the skin once a week., Disp: 3 mL, Rfl: 0 .  zolpidem (AMBIEN) 5 MG tablet, Take 1 tablet (5 mg total) by mouth at bedtime as needed for sleep., Disp: 30 tablet, Rfl: 5 .  zolpidem (AMBIEN CR) 6.25 MG CR tablet, Take 1 tablet (6.25 mg total) by mouth at bedtime as needed for sleep., Disp: 30 tablet, Rfl: 0  EXAM:  VITALS per patient if applicable:  GENERAL: alert, oriented, appears well and in no acute distress  HEENT: atraumatic, conjunttiva clear, no obvious abnormalities on inspection of external nose and ears  NECK: normal movements of the head and neck  LUNGS: on inspection no signs of respiratory distress, breathing rate appears normal, no obvious gross SOB, gasping or wheezing  CV: no obvious cyanosis  MS: moves all visible extremities without noticeable abnormality  PSYCH/NEURO: pleasant and cooperative, no obvious depression or anxiety, speech and  thought processing grossly intact  ASSESSMENT AND PLAN:  Discussed the following assessment and plan:  Chronic pain of left knee - Plan: DG Knee Complete 4 Views Left F/U [facility]  Insomnia, unspecified type - Plan: zolpidem (AMBIEN CR) 6.25 MG CR tablet  Morbid obesity with BMI of 50.0-59.9, adult (HCC) Lost 14 lbs  F/u wt loss clinic goal wt 220-230 lbs  HM-CPE next in person f/u  Flu shot due if wanted   Tdap thinks had in 2018  LMP 12/09/2018, Pap 2018 OB/GYN in [place] get records Pap negative 10/11/17 neg pap neg HPVs/p hysterectomy mammo10/28/2020 negativeand ordered GIB 08/2020 Disc colonoscopy age 47  Former PCP Dr. Kathe Mariner [facility]  Get  records [facility] [place] seen 01/17/18 DVT right lower ext 11/13/2016 s/p Eliquis x 2 months and iron def anemia hbg 6.4, 7.6  -h/o low Hbg A2 1.6 range 1.8-3.2 may indicate alpha thal or iron def anemia  H/o elevated lfts AST 42 range was 14-36   -we discussed possible serious and likely etiologies, options for evaluation and workup, limitations of telemedicine visit vs in person visit, treatment, treatment risks and precautions. Pt prefers to treat via telemedicine empirically rather then risking or undertaking an in person visit at this moment.      I discussed the assessment and treatment plan with the patient. The patient was provided an opportunity to ask questions and all were answered. The patient agreed with the plan and demonstrated an understanding of the instructions.    Time spent 20 minutes  Delorise Jackson, MD

## 2020-08-06 NOTE — Progress Notes (Signed)
Chief Complaint:   OBESITY Alyssa Cain is here to discuss her progress with her obesity treatment plan along with follow-up of her obesity related diagnoses. Alyssa Cain is on the Category 3 Plan. Alyssa Cain states she has increased her activity.  Today's visit was #: 3 Starting weight: 372 lbs Starting date: 07/04/2020 Today's weight: 358 lbs Today's date: 08/01/2020 Total lbs lost to date: 14 lbs Total lbs lost since last in-office visit: 8 lbs Total weight loss percentage to date: -3.76%  Interim History: Alyssa Cain says she had food poisoning last week.  She is feeling better now.  She is drinking a 60 ounce bottle of Crystal Light daily.  She says she has been doing no emotional eating.  Assessment/Plan:   1. Metabolic syndrome Improving. Goal: Lose 7-10% of starting weight. She will continue to focus on protein-rich, low simple carbohydrate foods. We reviewed the importance of hydration, regular exercise for stress reduction, and restorative sleep.   - Refill Semaglutide, 1 MG/DOSE, (OZEMPIC, 1 MG/DOSE,) 4 MG/3ML SOPN; Inject 1 mg into the skin once a week.  Dispense: 3 mL; Refill: 0  2. Visceral obesity Not at goal. Visceral adipose tissue is a hormonally active component of total body fat. This body composition phenotype is associated with medical disorders such as metabolic syndrome, cardiovascular disease and several malignancies including prostate, breast, and colorectal cancers. Goal: Lose 7-10% of starting weight. Visceral fat rating should be < 13.  3. Essential hypertension Improved.  She stopped HCTZ.  We will watch for signs of hypotension as she continues her lifestyle modifications.  BP Readings from Last 3 Encounters:  08/02/20 123/84  08/01/20 123/84  07/18/20 116/79   4. Class 3 severe obesity with serious comorbidity and body mass index (BMI) of 50.0 to 59.9 in adult, unspecified obesity type (HCC)  Alyssa Cain is currently in the action stage of change. As such, her goal is  to continue with weight loss efforts. She has agreed to the Category 3 Plan.   Exercise goals: For substantial health benefits, adults should do at least 150 minutes (2 hours and 30 minutes) a week of moderate-intensity, or 75 minutes (1 hour and 15 minutes) a week of vigorous-intensity aerobic physical activity, or an equivalent combination of moderate- and vigorous-intensity aerobic activity. Aerobic activity should be performed in episodes of at least 10 minutes, and preferably, it should be spread throughout the week.  Behavioral modification strategies: increasing lean protein intake and increasing water intake.  Alyssa Cain has agreed to follow-up with our clinic in 2-3 weeks. She was informed of the importance of frequent follow-up visits to maximize her success with intensive lifestyle modifications for her multiple health conditions.   Objective:   Blood pressure 123/84, pulse 66, temperature 98 F (36.7 C), temperature source Oral, height 5\' 10"  (1.778 m), weight (!) 358 lb (162.4 kg), SpO2 100 %. Body mass index is 51.37 kg/m.  General: Cooperative, alert, well developed, in no acute distress. HEENT: Conjunctivae and lids unremarkable. Cardiovascular: Regular rhythm.  Lungs: Normal work of breathing. Neurologic: No focal deficits.   Lab Results  Component Value Date   CREATININE 0.87 06/10/2020   BUN 13 06/10/2020   NA 140 06/10/2020   K 3.8 06/10/2020   CL 107 06/10/2020   CO2 27 06/10/2020   Lab Results  Component Value Date   ALT 39 06/10/2020   AST 35 06/10/2020   ALKPHOS 90 06/10/2020   BILITOT 0.4 06/10/2020   Lab Results  Component Value Date  HGBA1C 5.5 12/23/2018   Lab Results  Component Value Date   INSULIN 11.8 07/04/2020   Lab Results  Component Value Date   TSH 1.43 01/26/2020   Lab Results  Component Value Date   CHOL 191 01/26/2020   HDL 66.10 01/26/2020   LDLCALC 106 (H) 01/26/2020   TRIG 91.0 01/26/2020   CHOLHDL 3 01/26/2020   Lab  Results  Component Value Date   WBC 6.3 06/10/2020   HGB 12.0 06/10/2020   HCT 37.5 06/10/2020   MCV 89.5 06/10/2020   PLT 306 06/10/2020   Lab Results  Component Value Date   IRON 54 06/10/2020   TIBC 284 06/10/2020   FERRITIN 307 06/10/2020   Attestation Statements:   Reviewed by clinician on day of visit: allergies, medications, problem list, medical history, surgical history, family history, social history, and previous encounter notes.  I, Water quality scientist, CMA, am acting as transcriptionist for Briscoe Deutscher, DO  I have reviewed the above documentation for accuracy and completeness, and I agree with the above. Briscoe Deutscher, DO

## 2020-08-08 ENCOUNTER — Telehealth: Payer: Self-pay | Admitting: Internal Medicine

## 2020-08-08 NOTE — Telephone Encounter (Signed)
Prior authorization has been submitted for patient's Zolpidem Tartrate ER 6.25 MG  Awaiting approval or denial.

## 2020-08-13 NOTE — Telephone Encounter (Signed)
Prior authorization has been denied.  Patient informed and verbalized understanding.

## 2020-08-15 ENCOUNTER — Other Ambulatory Visit (INDEPENDENT_AMBULATORY_CARE_PROVIDER_SITE_OTHER): Payer: Self-pay | Admitting: Family Medicine

## 2020-08-15 DIAGNOSIS — E8881 Metabolic syndrome: Secondary | ICD-10-CM

## 2020-08-22 ENCOUNTER — Encounter (INDEPENDENT_AMBULATORY_CARE_PROVIDER_SITE_OTHER): Payer: Self-pay | Admitting: Family Medicine

## 2020-08-22 ENCOUNTER — Other Ambulatory Visit: Payer: Self-pay

## 2020-08-22 ENCOUNTER — Ambulatory Visit (INDEPENDENT_AMBULATORY_CARE_PROVIDER_SITE_OTHER): Payer: BC Managed Care – PPO | Admitting: Family Medicine

## 2020-08-22 VITALS — BP 137/92 | HR 67 | Temp 97.9°F | Ht 70.0 in | Wt 358.0 lb

## 2020-08-22 DIAGNOSIS — Z6841 Body Mass Index (BMI) 40.0 and over, adult: Secondary | ICD-10-CM

## 2020-08-22 DIAGNOSIS — E65 Localized adiposity: Secondary | ICD-10-CM | POA: Diagnosis not present

## 2020-08-22 DIAGNOSIS — E8881 Metabolic syndrome: Secondary | ICD-10-CM

## 2020-08-22 MED ORDER — BUPROPION HCL ER (SR) 150 MG PO TB12: 150.0000 mg | ORAL_TABLET | Freq: Every day | ORAL | 0 refills | Status: DC

## 2020-08-26 NOTE — Progress Notes (Signed)
Chief Complaint:   OBESITY Alyssa Cain is here to discuss her progress with her obesity treatment plan along with follow-up of her obesity related diagnoses. Alyssa Cain is on the Category 3 Plan and states she is following her eating plan approximately 99% of the time. Alyssa Cain states she is walking 2-3 times per week.  Today's visit was #: 4 Starting weight: 372 lbs Starting date: 07/04/2020 Today's weight: 358 lbs Today's date: 08/22/2020 Total lbs lost to date: 14 lbs Total lbs lost since last in-office visit: 0 Total weight loss percentage to date: -3.76%  Interim History: Alyssa Cain says she is getting her protein.  Hunger and cravings are moderately controlled. Lacks energy and motivation. She says she has tried phentermine before it it was not helpful.  Her water intake is at goal. Plan:  Start Wellbutrin 150 mg daily.  Assessment/Plan:   1. Visceral obesity Current visceral fat rating: 21. Visceral adipose tissue is a hormonally active component of total body fat. This body composition phenotype is associated with medical disorders such as metabolic syndrome, cardiovascular disease and several malignancies including prostate, breast, and colorectal cancers. Goal: Lose 7-10% of starting weight. Visceral fat rating should be < 13.  2. Metabolic syndrome Goal: Lose 7-10% of starting weight. We discussed several lifestyle modifications today and she will continue to work on diet, exercise and weight loss efforts.   3. Class 3 severe obesity with serious comorbidity and body mass index (BMI) of 50.0 to 59.9 in adult, unspecified obesity type ([place]) After discussion, patient would like to start below medication. Expectations, risks, and potential side effects reviewed.   - Start buPROPion (WELLBUTRIN SR) 150 MG 12 hr tablet; Take 1 tablet (150 mg total) by mouth daily.  Dispense: 30 tablet; Refill: 0  Alyssa Cain  currently in the action stage of change. As such, her goal is to continue with weight  loss efforts. She has agreed to keeping a food journal and adhering to recommended goals of 1500 calories and 95+ grams of protein.   Exercise goals: Band It.  Add sofa exercises.  Behavioral modification strategies: increasing lean protein intake and decreasing simple carbohydrates.  Alyssa Cain has agreed to follow-up with our clinic in 3 weeks. She was informed of the importance of frequent follow-up visits to maximize her success with intensive lifestyle modifications for her multiple health conditions.   Objective:   Blood pressure (!) 137/92, pulse 67, temperature 97.9 F (36.6 C), temperature source Oral, height 5\' 10"  (1.778 m), weight (!) 358 lb (162.4 kg), SpO2 100 %. Body mass index is 51.37 kg/m.  General: Cooperative, alert, well developed, in no acute distress. HEENT: Conjunctivae and lids unremarkable. Cardiovascular: Regular rhythm.  Lungs: Normal work of breathing. Neurologic: No focal deficits.   Lab Results  Component Value Date   CREATININE 0.87 06/10/2020   BUN 13 06/10/2020   NA 140 06/10/2020   K 3.8 06/10/2020   CL 107 06/10/2020   CO2 27 06/10/2020   Lab Results  Component Value Date   ALT 39 06/10/2020   AST 35 06/10/2020   ALKPHOS 90 06/10/2020   BILITOT 0.4 06/10/2020   Lab Results  Component Value Date   HGBA1C 5.5 12/23/2018   Lab Results  Component Value Date   INSULIN 11.8 07/04/2020   Lab Results  Component Value Date   TSH 1.43 01/26/2020   Lab Results  Component Value Date   CHOL 191 01/26/2020   HDL 66.10 01/26/2020   LDLCALC 106 (H) 01/26/2020  TRIG 91.0 01/26/2020   CHOLHDL 3 01/26/2020   Lab Results  Component Value Date   WBC 6.3 06/10/2020   HGB 12.0 06/10/2020   HCT 37.5 06/10/2020   MCV 89.5 06/10/2020   PLT 306 06/10/2020   Lab Results  Component Value Date   IRON 54 06/10/2020   TIBC 284 06/10/2020   FERRITIN 307 06/10/2020   Attestation Statements:   Reviewed by clinician on day of visit: allergies,  medications, problem list, medical history, surgical history, family history, social history, and previous encounter notes.  I, Water quality scientist, CMA, am acting as transcriptionist for Briscoe Deutscher, DO  I have reviewed the above documentation for accuracy and completeness, and I agree with the above. Briscoe Deutscher, DO

## 2020-09-12 ENCOUNTER — Telehealth (INDEPENDENT_AMBULATORY_CARE_PROVIDER_SITE_OTHER): Payer: BC Managed Care – PPO | Admitting: Family Medicine

## 2020-09-12 ENCOUNTER — Encounter (INDEPENDENT_AMBULATORY_CARE_PROVIDER_SITE_OTHER): Payer: Self-pay | Admitting: Family Medicine

## 2020-09-12 ENCOUNTER — Other Ambulatory Visit: Payer: Self-pay

## 2020-09-12 VITALS — Ht 70.0 in | Wt 358.0 lb

## 2020-09-12 DIAGNOSIS — F3289 Other specified depressive episodes: Secondary | ICD-10-CM | POA: Diagnosis not present

## 2020-09-12 DIAGNOSIS — Z6841 Body Mass Index (BMI) 40.0 and over, adult: Secondary | ICD-10-CM

## 2020-09-12 DIAGNOSIS — E8881 Metabolic syndrome: Secondary | ICD-10-CM

## 2020-09-12 DIAGNOSIS — G4709 Other insomnia: Secondary | ICD-10-CM | POA: Diagnosis not present

## 2020-09-12 MED ORDER — OZEMPIC (1 MG/DOSE) 4 MG/3ML ~~LOC~~ SOPN: 1.0000 mg | PEN_INJECTOR | SUBCUTANEOUS | 0 refills | Status: DC

## 2020-09-15 NOTE — Progress Notes (Signed)
TeleHealth Visit:  Due to the COVID-19 pandemic, this visit was completed with telemedicine (audio/video) technology to reduce patient and provider exposure as well as to preserve personal protective equipment.   Alyssa Cain has verbally consented to this TeleHealth visit. The patient is located at home, the provider is located at the [facility] office. The participants in this visit include the listed provider and patient. The visit was conducted today via Mining engineer.  Chief Complaint: OBESITY Alyssa Cain is here to discuss her progress with her obesity treatment plan along with follow-up of her obesity related diagnoses. [name] is on the Category 3 Plan and states she is walking 2-3 times per week. She is also in the process of moving.  Interim History: Virtual visit today. Alyssa Cain fell and was evaluated for arm pain today. She continues to follow her plan, but feels stalled. She says that this is her pattern and that she is not surprised.   Assessment/Plan:   1. Other insomnia Not optimized. Med: Ambien. Insufficient sleep is associated with obesity, DM2, CAD, HTN, and premature death. Metabolic and endocrine manifestations include decreased glucose tolerance, decreased insulin sensitivity, increased evening cortisol, increased ghrelin, and decreased leptin. Adults sleeping <= 5 h were 55% more likely to have obesity than those sleeping >5 hours. A reduction of 1 h of sleep is associated with 0.35kg/m2 increase in BMI. - Curr Obes Rep (2012) 1:245-256.   Plan: Recommend sleep hygiene measures including regular sleep schedule, optimal sleep environment, and relaxing presleep rituals.   2. Metabolic syndrome Starting goal: Lose 7-10% of starting weight.   Plan: She will continue to focus on protein-rich, low simple carbohydrate foods. We reviewed the importance of hydration, regular exercise for stress reduction, and restorative sleep.  We will continue to check lab  work every 3 months, with 10% weight loss, or should any other concerns arise.  - Semaglutide, 1 MG/DOSE, (OZEMPIC, 1 MG/DOSE,) 4 MG/3ML SOPN; Inject 1 mg into the skin once a week.  Dispense: 3 mL; Refill: 0  3. Emotional eating Food choices and timing of food intake reviewed today. She will continue to focus on protein-rich, low simple carbohydrate foods. We reviewed the importance of hydration, regular exercise for stress reduction, and restorative sleep.   4. Class 3 severe obesity with serious comorbidity and body mass index (BMI) of 50.0 to 59.9 in adult, unspecified obesity type (HCC)  Alyssa Cain is currently in the action stage of change. As such, her goal is to continue with weight loss efforts. She has agreed to the Category 3 Plan.   Exercise goals: For substantial health benefits, adults should do at least 150 minutes (2 hours and 30 minutes) a week of moderate-intensity, or 75 minutes (1 hour and 15 minutes) a week of vigorous-intensity aerobic physical activity, or an equivalent combination of moderate- and vigorous-intensity aerobic activity. Aerobic activity should be performed in episodes of at least 10 minutes, and preferably, it should be spread throughout the week. Adults should also include muscle-strengthening activities that involve all major muscle groups on 2 or more days a week.  Behavioral modification strategies: increasing lean protein intake, decreasing simple carbohydrates, increasing vegetables and increasing water intake.  [name] has agreed to follow-up with our clinic in 2 weeks. She was informed of the importance of frequent follow-up visits to maximize her success with intensive lifestyle modifications for her multiple health conditions.  Objective:   VITALS: Per patient if applicable, see vitals. GENERAL: Alert and in no acute  distress. CARDIOPULMONARY: No increased WOB. Speaking in clear sentences.  PSYCH: Pleasant and cooperative. Speech normal rate and  rhythm. Affect is appropriate. Insight and judgement are appropriate. Attention is focused, linear, and appropriate.  NEURO: Oriented as arrived to appointment on time with no prompting.   Lab Results  Component Value Date   CREATININE 0.87 06/10/2020   BUN 13 06/10/2020   NA 140 06/10/2020   K 3.8 06/10/2020   CL 107 06/10/2020   CO2 27 06/10/2020   Lab Results  Component Value Date   ALT 39 06/10/2020   AST 35 06/10/2020   ALKPHOS 90 06/10/2020   BILITOT 0.4 06/10/2020   Lab Results  Component Value Date   HGBA1C 5.5 12/23/2018   Lab Results  Component Value Date   INSULIN 11.8 07/04/2020   Lab Results  Component Value Date   TSH 1.43 01/26/2020   Lab Results  Component Value Date   CHOL 191 01/26/2020   HDL 66.10 01/26/2020   LDLCALC 106 (H) 01/26/2020   TRIG 91.0 01/26/2020   CHOLHDL 3 01/26/2020   Lab Results  Component Value Date   WBC 6.3 06/10/2020   HGB 12.0 06/10/2020   HCT 37.5 06/10/2020   MCV 89.5 06/10/2020   PLT 306 06/10/2020   Lab Results  Component Value Date   IRON 54 06/10/2020   TIBC 284 06/10/2020   FERRITIN 307 06/10/2020   Attestation Statements:   Reviewed by clinician on day of visit: allergies, medications, problem list, medical history, surgical history, family history, social history, and previous encounter notes.

## 2020-09-17 DIAGNOSIS — M1711 Unilateral primary osteoarthritis, right knee: Secondary | ICD-10-CM | POA: Diagnosis not present

## 2020-09-17 DIAGNOSIS — M25561 Pain in right knee: Secondary | ICD-10-CM | POA: Diagnosis not present

## 2020-09-17 DIAGNOSIS — M7121 Synovial cyst of popliteal space [Baker], right knee: Secondary | ICD-10-CM | POA: Diagnosis not present

## 2020-09-17 DIAGNOSIS — M25461 Effusion, right knee: Secondary | ICD-10-CM | POA: Diagnosis not present

## 2020-09-18 ENCOUNTER — Other Ambulatory Visit (INDEPENDENT_AMBULATORY_CARE_PROVIDER_SITE_OTHER): Payer: Self-pay | Admitting: Family Medicine

## 2020-09-18 ENCOUNTER — Encounter (INDEPENDENT_AMBULATORY_CARE_PROVIDER_SITE_OTHER): Payer: Self-pay

## 2020-09-18 NOTE — Telephone Encounter (Signed)
Message sent to pt.

## 2020-10-01 ENCOUNTER — Encounter: Payer: Self-pay | Admitting: Internal Medicine

## 2020-10-02 ENCOUNTER — Ambulatory Visit (INDEPENDENT_AMBULATORY_CARE_PROVIDER_SITE_OTHER): Payer: BC Managed Care – PPO | Admitting: Family Medicine

## 2020-10-02 ENCOUNTER — Other Ambulatory Visit: Payer: Self-pay

## 2020-10-02 ENCOUNTER — Encounter (INDEPENDENT_AMBULATORY_CARE_PROVIDER_SITE_OTHER): Payer: Self-pay | Admitting: Family Medicine

## 2020-10-02 VITALS — BP 131/72 | HR 71 | Temp 98.0°F | Ht 70.0 in | Wt 361.0 lb

## 2020-10-02 DIAGNOSIS — E8881 Metabolic syndrome: Secondary | ICD-10-CM

## 2020-10-02 DIAGNOSIS — R632 Polyphagia: Secondary | ICD-10-CM

## 2020-10-02 DIAGNOSIS — Z9189 Other specified personal risk factors, not elsewhere classified: Secondary | ICD-10-CM

## 2020-10-02 DIAGNOSIS — G4709 Other insomnia: Secondary | ICD-10-CM

## 2020-10-02 DIAGNOSIS — Z6841 Body Mass Index (BMI) 40.0 and over, adult: Secondary | ICD-10-CM

## 2020-10-02 DIAGNOSIS — F5081 Binge eating disorder: Secondary | ICD-10-CM

## 2020-10-02 MED ORDER — LISDEXAMFETAMINE DIMESYLATE 20 MG PO CAPS: 20.0000 mg | ORAL_CAPSULE | Freq: Every day | ORAL | 0 refills | Status: DC

## 2020-10-03 LAB — CBC WITH DIFFERENTIAL/PLATELET
Basophils Absolute: 0 10*3/uL (ref 0.0–0.2)
Basos: 0 %
EOS (ABSOLUTE): 0.1 10*3/uL (ref 0.0–0.4)
Eos: 2 %
Hemoglobin: 11.2 g/dL (ref 11.1–15.9)
Immature Grans (Abs): 0 10*3/uL (ref 0.0–0.1)
Immature Granulocytes: 0 %
Lymphocytes Absolute: 1.9 10*3/uL (ref 0.7–3.1)
Lymphs: 39 %
MCH: 28.4 pg (ref 26.6–33.0)
MCHC: 32 g/dL (ref 31.5–35.7)
MCV: 89 fL (ref 79–97)
Monocytes Absolute: 0.2 10*3/uL (ref 0.1–0.9)
Monocytes: 5 %
Neutrophils Absolute: 2.6 10*3/uL (ref 1.4–7.0)
Neutrophils: 54 %
Platelets: 333 10*3/uL (ref 150–450)
RBC: 3.95 x10E6/uL (ref 3.77–5.28)
RDW: 13.5 % (ref 11.7–15.4)
WBC: 4.8 10*3/uL (ref 3.4–10.8)

## 2020-10-03 LAB — VITAMIN D 25 HYDROXY (VIT D DEFICIENCY, FRACTURES): Vit D, 25-Hydroxy: 22.5 ng/mL — ABNORMAL LOW (ref 30.0–100.0)

## 2020-10-03 LAB — ANEMIA PANEL
Ferritin: 445 ng/mL — ABNORMAL HIGH (ref 15–150)
Folate, Hemolysate: 252 ng/mL
Folate, RBC: 720 ng/mL (ref >498)
Hematocrit: 35 % (ref 34.0–46.6)
Iron Saturation: 31 % (ref 15–55)
Iron: 63 ug/dL (ref 27–159)
Retic Ct Pct: 1.8 % (ref 0.6–2.6)
Total Iron Binding Capacity: 205 ug/dL — ABNORMAL LOW (ref 250–450)
UIBC: 142 ug/dL (ref 131–425)
Vitamin B-12: 248 pg/mL (ref 232–1245)

## 2020-10-03 LAB — COMPREHENSIVE METABOLIC PANEL
ALT: 30 IU/L (ref 0–32)
AST: 27 IU/L (ref 0–40)
Albumin/Globulin Ratio: 1.4 (ref 1.2–2.2)
Albumin: 3.5 g/dL — ABNORMAL LOW (ref 3.8–4.8)
Alkaline Phosphatase: 118 IU/L (ref 44–121)
BUN/Creatinine Ratio: 11 (ref 9–23)
BUN: 8 mg/dL (ref 6–24)
Bilirubin Total: 0.3 mg/dL (ref 0.0–1.2)
CO2: 24 mmol/L (ref 20–29)
Calcium: 8 mg/dL — ABNORMAL LOW (ref 8.7–10.2)
Chloride: 104 mmol/L (ref 96–106)
Creatinine, Ser: 0.74 mg/dL (ref 0.57–1.00)
GFR calc Af Amer: 116 mL/min/{1.73_m2} (ref >59)
GFR calc non Af Amer: 100 mL/min/{1.73_m2} (ref >59)
Globulin, Total: 2.5 g/dL (ref 1.5–4.5)
Glucose: 80 mg/dL (ref 65–99)
Potassium: 3.9 mmol/L (ref 3.5–5.2)
Sodium: 141 mmol/L (ref 134–144)
Total Protein: 6 g/dL (ref 6.0–8.5)

## 2020-10-03 LAB — HEMOGLOBIN A1C
Est. average glucose Bld gHb Est-mCnc: 105 mg/dL
Hgb A1c MFr Bld: 5.3 % (ref 4.8–5.6)

## 2020-10-03 LAB — TSH: TSH: 3.09 u[IU]/mL (ref 0.450–4.500)

## 2020-10-03 LAB — INSULIN, RANDOM: INSULIN: 6.6 u[IU]/mL (ref 2.6–24.9)

## 2020-10-07 NOTE — Progress Notes (Signed)
Chief Complaint:   OBESITY Alyssa Cain is here to discuss her progress with her obesity treatment plan along with follow-up of her obesity related diagnoses.   Today's visit was #: 6 Starting weight: 372 lbs Starting date: 07/04/2020 Today's weight: 361 lbs Today's date: 10/02/2020 Total lbs lost to date: 11 lbs Body mass index is 51.8 kg/m.  Total weight loss percentage to date: -2.96%  Interim History: Alyssa Cain says she has been getting under 1500 calories per day.  She has been eating out a little more and has been finishing meals recently.  Food recall: Eats protein based dinner. Snacks throughout the day (applesauce, grapes, cheese).  Goal:  Drink more water.  Nutrition Plan: keeping a food journal and adhering to recommended goals of 1500 calories and 95 grams of protein for 50% of the time.  Anti-obesity medications: Ozempic. Reported side effects: None. Hunger is well controlled.  Cravings are well controlled. Activity: Moving furniture and moving for 8 hours 7 times per week. Sleep: Number of hours slept each night: 7.  Motivation and energy:  Decreased.  Assessment/Plan:   1. Metabolic syndrome Starting goal: Lose 7-10% of starting weight. She will continue to focus on protein-rich, low simple carbohydrate foods. We reviewed the importance of hydration, regular exercise for stress reduction, and restorative sleep.  We will continue to check lab work every 3 months, with 10% weight loss, or should any other concerns arise.  She is taking Ozempic.  - Comprehensive metabolic panel - CBC with Differential/Platelet - Hemoglobin [name] - Insulin, random - VITAMIN D 25 Hydroxy (Vit-D Deficiency, Fractures) - TSH - Anemia panel  2. Other insomnia Average hours of sleep per night: 7.   Plan: Recommend sleep hygiene measures including regular sleep schedule, optimal sleep environment, and relaxing presleep rituals. Medication: Ambien 5 mg as bedtime as needed.  3. Binge  eating disorder People who binge eat feel as if they don't have control over how much they eat and have feelings of guilt or self-loathing after a binge eating episode. Alyssa Cain estimates that about 30 percent of adults with binge eating disorder also have a history of ADHD. The FDA has approved Vyvanse as a treatment option for both ADHD and binge eating. Vyvanse targets the brain's reward center by increasing the levels of dopamine and norepinephrine, the chemicals of the brain responsible for feelings of pleasure. Mindful eating is the recommended nutritional approach to treating BED.   Plan:  Will start Vyvanse 20 mg daily, as per below.  - Start lisdexamfetamine (VYVANSE) 20 MG capsule; Take 1 capsule (20 mg total) by mouth daily.  Dispense: 30 capsule; Refill: 0 - Comprehensive metabolic panel - CBC with Differential/Platelet - Hemoglobin [name] - Insulin, random - VITAMIN D 25 Hydroxy (Vit-D Deficiency, Fractures) - TSH - Anemia panel  I have consulted the [place] Controlled Substances Registry for this patient, and feel the risk/benefit ratio today is favorable for proceeding with this prescription for a controlled substance. The patient understands monitoring parameters and red flags.   4. At risk for constipation Alyssa Cain was given approximately 15 minutes of counseling today regarding prevention of constipation. She was encouraged to increase water and fiber intake.   5. Class 3 severe obesity with serious comorbidity and body mass index (BMI) of 50.0 to 59.9 in adult, unspecified obesity type (Alyssa Cain)  Course: Alyssa Cain is currently in the action stage of change. As such, her goal is to continue with weight loss efforts.   Nutrition goals: She  has agreed to keeping a food journal and adhering to recommended goals of 1500 calories and 95 grams of protein.   Exercise goals: For substantial health benefits, adults should do at least 150 minutes (2 hours and 30 minutes) a week of  moderate-intensity, or 75 minutes (1 hour and 15 minutes) a week of vigorous-intensity aerobic physical activity, or an equivalent combination of moderate- and vigorous-intensity aerobic activity. Aerobic activity should be performed in episodes of at least 10 minutes, and preferably, it should be spread throughout the week.  Behavioral modification strategies: increasing water intake.  Alyssa Cain has agreed to follow-up with our clinic in 3 weeks. She was informed of the importance of frequent follow-up visits to maximize her success with intensive lifestyle modifications for her multiple health conditions.   Alyssa Cain was informed we would discuss her lab results at her next visit unless there is a critical issue that needs to be addressed sooner. Alyssa Cain agreed to keep her next visit at the agreed upon time to discuss these results.  Objective:   Blood pressure 131/72, pulse 71, temperature 98 F (36.7 C), height 5\' 10"  (1.778 m), weight (!) 361 lb (163.7 kg), SpO2 98 %. Body mass index is 51.8 kg/m.  General: Cooperative, alert, well developed, in no acute distress. HEENT: Conjunctivae and lids unremarkable. Cardiovascular: Regular rhythm.  Lungs: Normal work of breathing. Neurologic: No focal deficits.   Lab Results  Component Value Date   CREATININE 0.74 10/02/2020   BUN 8 10/02/2020   NA 141 10/02/2020   K 3.9 10/02/2020   CL 104 10/02/2020   CO2 24 10/02/2020   Lab Results  Component Value Date   ALT 30 10/02/2020   AST 27 10/02/2020   ALKPHOS 118 10/02/2020   BILITOT 0.3 10/02/2020   Lab Results  Component Value Date   HGBA1C 5.3 10/02/2020   HGBA1C 5.5 12/23/2018   Lab Results  Component Value Date   INSULIN 6.6 10/02/2020   INSULIN 11.8 07/04/2020   Lab Results  Component Value Date   TSH 3.090 10/02/2020   Lab Results  Component Value Date   CHOL 191 01/26/2020   HDL 66.10 01/26/2020   LDLCALC 106 (H) 01/26/2020   TRIG 91.0 01/26/2020   CHOLHDL 3  01/26/2020   Lab Results  Component Value Date   WBC 4.8 10/02/2020   HGB 11.2 10/02/2020   HCT 35.0 10/02/2020   MCV 89 10/02/2020   PLT 333 10/02/2020   Lab Results  Component Value Date   IRON 63 10/02/2020   TIBC 205 (L) 10/02/2020   FERRITIN 445 (H) 10/02/2020   Attestation Statements:   Reviewed by clinician on day of visit: allergies, medications, problem list, medical history, surgical history, family history, social history, and previous encounter notes.  I, Water quality scientist, CMA, am acting as transcriptionist for Briscoe Deutscher, DO  I have reviewed the above documentation for accuracy and completeness, and I agree with the above. Briscoe Deutscher, DO

## 2020-10-07 NOTE — Addendum Note (Signed)
Addended by: Orland Mustard on: 10/07/2020 07:50 AM   Modules accepted: Orders

## 2020-10-09 ENCOUNTER — Encounter (INDEPENDENT_AMBULATORY_CARE_PROVIDER_SITE_OTHER): Payer: Self-pay

## 2020-10-09 ENCOUNTER — Other Ambulatory Visit (INDEPENDENT_AMBULATORY_CARE_PROVIDER_SITE_OTHER): Payer: Self-pay | Admitting: Family Medicine

## 2020-10-09 DIAGNOSIS — E8881 Metabolic syndrome: Secondary | ICD-10-CM

## 2020-10-09 NOTE — Telephone Encounter (Signed)
Message sent to pt.

## 2020-10-09 NOTE — Telephone Encounter (Signed)
Last OV with Dr Wallace 

## 2020-10-22 ENCOUNTER — Other Ambulatory Visit: Payer: Self-pay | Admitting: Physician Assistant

## 2020-10-22 DIAGNOSIS — H9312 Tinnitus, left ear: Secondary | ICD-10-CM | POA: Diagnosis not present

## 2020-10-22 DIAGNOSIS — H9190 Unspecified hearing loss, unspecified ear: Secondary | ICD-10-CM | POA: Diagnosis not present

## 2020-10-22 DIAGNOSIS — H93A9 Pulsatile tinnitus, unspecified ear: Secondary | ICD-10-CM

## 2020-10-23 ENCOUNTER — Ambulatory Visit (INDEPENDENT_AMBULATORY_CARE_PROVIDER_SITE_OTHER): Payer: BC Managed Care – PPO | Admitting: Family Medicine

## 2020-10-23 ENCOUNTER — Other Ambulatory Visit: Payer: Self-pay

## 2020-10-23 ENCOUNTER — Encounter (INDEPENDENT_AMBULATORY_CARE_PROVIDER_SITE_OTHER): Payer: Self-pay | Admitting: Family Medicine

## 2020-10-23 VITALS — BP 145/95 | HR 68 | Temp 98.2°F | Ht 70.0 in | Wt 350.0 lb

## 2020-10-23 DIAGNOSIS — F5081 Binge eating disorder: Secondary | ICD-10-CM | POA: Diagnosis not present

## 2020-10-23 DIAGNOSIS — Z6841 Body Mass Index (BMI) 40.0 and over, adult: Secondary | ICD-10-CM

## 2020-10-23 DIAGNOSIS — Z9189 Other specified personal risk factors, not elsewhere classified: Secondary | ICD-10-CM | POA: Diagnosis not present

## 2020-10-23 DIAGNOSIS — E559 Vitamin D deficiency, unspecified: Secondary | ICD-10-CM

## 2020-10-23 DIAGNOSIS — E538 Deficiency of other specified B group vitamins: Secondary | ICD-10-CM | POA: Diagnosis not present

## 2020-10-23 DIAGNOSIS — E8881 Metabolic syndrome: Secondary | ICD-10-CM | POA: Diagnosis not present

## 2020-10-23 MED ORDER — VITAMIN D (ERGOCALCIFEROL) 1.25 MG (50000 UNIT) PO CAPS: 50000.0000 [IU] | ORAL_CAPSULE | ORAL | 0 refills | Status: DC

## 2020-10-23 MED ORDER — LISDEXAMFETAMINE DIMESYLATE 40 MG PO CAPS: 40.0000 mg | ORAL_CAPSULE | ORAL | 0 refills | Status: DC

## 2020-10-23 MED ORDER — BUPROPION HCL ER (SR) 150 MG PO TB12: 150.0000 mg | ORAL_TABLET | Freq: Every day | ORAL | 0 refills | Status: DC

## 2020-10-23 MED ORDER — OZEMPIC (1 MG/DOSE) 4 MG/3ML ~~LOC~~ SOPN: 1.0000 mg | PEN_INJECTOR | SUBCUTANEOUS | 0 refills | Status: DC

## 2020-10-24 NOTE — Progress Notes (Signed)
Chief Complaint:   OBESITY Alyssa Cain is here to discuss her progress with her obesity treatment plan along with follow-up of her obesity related diagnoses.   Today's visit was #: 7 Starting weight: 372 lbs Starting date: 07/04/2020 Today's weight: 350 lbs Today's date: 10/23/2020 Total lbs lost to date: 22 lbs Body mass index is 50.22 kg/m.  Total weight loss percentage to date: -5.91%  Interim History: Alyssa Cain says she is drinking more water ([place]). Nutrition Plan: keeping a food journal and adhering to recommended goals of 1500 calories and 95 grams of protein.  Anti-obesity medications: Ozempic. Reported side effects: None. Activity: Walking for 10 minutes 1-2 times per week.  Assessment/Plan:   1. B12 deficiency Alyssa Cain has history of gastric bypass. Not taking a bariatric MVM at this time. We discussed the importance of continuing these for life.  Plan:  Start bariatric multivitamin.    Lab Results  Component Value Date   VITAMINB12 248 10/02/2020   2. Vitamin D deficiency Not at goal. Current vitamin D is 22.5, tested on 10/02/2020. Optimal goal > 50 ng/dL.   Plan:  [x]   Continue Vitamin D @50 ,000 IU every week. []   Continue home supplement daily. [x]   Follow-up for routine testing of Vitamin D at least 2-3 times per year to avoid over-replacement.  - Refill Vitamin D, Ergocalciferol, (DRISDOL) 1.25 MG (50000 UNIT) CAPS capsule; Take 1 capsule (50,000 Units total) by mouth every 7 (seven) days.  Dispense: 12 capsule; Refill: 0  3. Metabolic syndrome Starting goal: Lose 7-10% of starting weight. She will continue to focus on protein-rich, low simple carbohydrate foods. We reviewed the importance of hydration, regular exercise for stress reduction, and restorative sleep.  We will continue to check lab work every 3 months, with 10% weight loss, or should any other concerns arise.  - Refill Semaglutide, 1 MG/DOSE, (OZEMPIC, 1 MG/DOSE,) 4 MG/3ML SOPN; Inject 1  mg into the skin once a week.  Dispense: 9 mL; Refill: 0  4. Binge eating disorder People who binge eat feel as if they don't have control over how much they eat and have feelings of guilt or self-loathing after a binge eating episode. Outlook estimates that about 30 percent of adults with binge eating disorder also have a history of ADHD. The FDA has approved Vyvanse as a treatment option for both ADHD and binge eating. Vyvanse targets the brain's reward center by increasing the levels of dopamine and norepinephrine, the chemicals of the brain responsible for feelings of pleasure. Mindful eating is the recommended nutritional approach to treating BED.   - Refill lisdexamfetamine (VYVANSE) 40 MG capsule; Take 1 capsule (40 mg total) by mouth every morning.  Dispense: 30 capsule; Refill: 0 - Refill buPROPion (WELLBUTRIN SR) 150 MG 12 hr tablet; Take 1 tablet (150 mg total) by mouth daily.  Dispense: 30 tablet; Refill: 0  I have consulted the [place] Controlled Substances Registry for this patient, and feel the risk/benefit ratio today is favorable for proceeding with this prescription for a controlled substance. The patient understands monitoring parameters and red flags.   5. At risk for heart disease Alyssa Cain was given approximately 9 minutes of coronary artery disease prevention counseling today. She is 44 y.o. female and has risk factors for heart disease including obesity and metabolic syndrome. We discussed intensive lifestyle modifications today with an emphasis on specific weight loss instructions and strategies. Repetitive spaced learning was employed today to elicit superior memory formation and behavioral change.  6. Class 3 severe obesity with serious comorbidity and body mass index (BMI) of 50.0 to 59.9 in adult, unspecified obesity type ([place])  Course: Alyssa Cain is currently in the action stage of change. As such, her goal is to continue with weight loss efforts.   Nutrition goals: She has  agreed to keeping a food journal and adhering to recommended goals of 1500 calories and 100 grams of protein.   Exercise goals: For substantial health benefits, adults should do at least 150 minutes (2 hours and 30 minutes) a week of moderate-intensity, or 75 minutes (1 hour and 15 minutes) a week of vigorous-intensity aerobic physical activity, or an equivalent combination of moderate- and vigorous-intensity aerobic activity. Aerobic activity should be performed in episodes of at least 10 minutes, and preferably, it should be spread throughout the week.  Behavioral modification strategies: increasing lean protein intake, decreasing simple carbohydrates, increasing vegetables, increasing water intake, dealing with family or coworker sabotage, travel eating strategies, holiday eating strategies  and celebration eating strategies.  Alyssa Cain has agreed to follow-up with our clinic in 4 weeks. She was informed of the importance of frequent follow-up visits to maximize her success with intensive lifestyle modifications for her multiple health conditions.   Objective:   Blood pressure (!) 145/95, pulse 68, temperature 98.2 F (36.8 C), temperature source Oral, height 5\' 10"  (1.778 m), weight (!) 350 lb (158.8 kg), SpO2 98 %. Body mass index is 50.22 kg/m.  General: Cooperative, alert, well developed, in no acute distress. HEENT: Conjunctivae and lids unremarkable. Cardiovascular: Regular rhythm.  Lungs: Normal work of breathing. Neurologic: No focal deficits.   Lab Results  Component Value Date   CREATININE 0.74 10/02/2020   BUN 8 10/02/2020   NA 141 10/02/2020   K 3.9 10/02/2020   CL 104 10/02/2020   CO2 24 10/02/2020   Lab Results  Component Value Date   ALT 30 10/02/2020   AST 27 10/02/2020   ALKPHOS 118 10/02/2020   BILITOT 0.3 10/02/2020   Lab Results  Component Value Date   HGBA1C 5.3 10/02/2020   HGBA1C 5.5 12/23/2018   Lab Results  Component Value Date   INSULIN 6.6  10/02/2020   INSULIN 11.8 07/04/2020   Lab Results  Component Value Date   TSH 3.090 10/02/2020   Lab Results  Component Value Date   CHOL 191 01/26/2020   HDL 66.10 01/26/2020   LDLCALC 106 (H) 01/26/2020   TRIG 91.0 01/26/2020   CHOLHDL 3 01/26/2020   Lab Results  Component Value Date   WBC 4.8 10/02/2020   HGB 11.2 10/02/2020   HCT 35.0 10/02/2020   MCV 89 10/02/2020   PLT 333 10/02/2020   Lab Results  Component Value Date   IRON 63 10/02/2020   TIBC 205 (L) 10/02/2020   FERRITIN 445 (H) 10/02/2020   Attestation Statements:   Reviewed by clinician on day of visit: allergies, medications, problem list, medical history, surgical history, family history, social history, and previous encounter notes.  I, Water quality scientist, CMA, am acting as transcriptionist for Briscoe Deutscher, DO  I have reviewed the above documentation for accuracy and completeness, and I agree with the above. Briscoe Deutscher, DO

## 2020-11-07 ENCOUNTER — Other Ambulatory Visit: Payer: Self-pay | Admitting: Internal Medicine

## 2020-11-07 DIAGNOSIS — G4700 Insomnia, unspecified: Secondary | ICD-10-CM

## 2020-11-07 MED ORDER — ZOLPIDEM TARTRATE 5 MG PO TABS: 5.0000 mg | ORAL_TABLET | Freq: Every evening | ORAL | 5 refills | Status: DC | PRN

## 2020-11-11 ENCOUNTER — Ambulatory Visit
Admission: RE | Admit: 2020-11-11 | Discharge: 2020-11-11 | Disposition: A | Discharge status: Home / Self Care | Payer: BC Managed Care – PPO | Source: Ambulatory Visit | Attending: Internal Medicine | Admitting: Internal Medicine

## 2020-11-11 ENCOUNTER — Ambulatory Visit
Admission: RE | Admit: 2020-11-11 | Discharge: 2020-11-11 | Disposition: A | Discharge status: Home / Self Care | Payer: BC Managed Care – PPO | Source: Ambulatory Visit | Attending: Physician Assistant | Admitting: Physician Assistant

## 2020-11-11 DIAGNOSIS — H93A9 Pulsatile tinnitus, unspecified ear: Secondary | ICD-10-CM | POA: Diagnosis not present

## 2020-11-11 DIAGNOSIS — M1712 Unilateral primary osteoarthritis, left knee: Secondary | ICD-10-CM | POA: Diagnosis not present

## 2020-11-11 DIAGNOSIS — M25562 Pain in left knee: Secondary | ICD-10-CM

## 2020-11-11 MED ORDER — IOPAMIDOL (ISOVUE-370) INJECTION 76%
75.0000 mL | Freq: Once | INTRAVENOUS | Status: AC | PRN
  Administered 2020-11-11: 75 mL via INTRAVENOUS

## 2020-11-12 DIAGNOSIS — E237 Disorder of pituitary gland, unspecified: Secondary | ICD-10-CM | POA: Diagnosis not present

## 2020-11-12 DIAGNOSIS — M5030 Other cervical disc degeneration, unspecified cervical region: Secondary | ICD-10-CM | POA: Diagnosis not present

## 2020-11-12 DIAGNOSIS — H93A9 Pulsatile tinnitus, unspecified ear: Secondary | ICD-10-CM | POA: Diagnosis not present

## 2020-11-12 DIAGNOSIS — I672 Cerebral atherosclerosis: Secondary | ICD-10-CM | POA: Diagnosis not present

## 2020-11-14 ENCOUNTER — Encounter: Payer: Self-pay | Admitting: Internal Medicine

## 2020-11-14 DIAGNOSIS — H93A9 Pulsatile tinnitus, unspecified ear: Secondary | ICD-10-CM | POA: Insufficient documentation

## 2020-11-14 DIAGNOSIS — I672 Cerebral atherosclerosis: Secondary | ICD-10-CM | POA: Insufficient documentation

## 2020-11-14 DIAGNOSIS — M5030 Other cervical disc degeneration, unspecified cervical region: Secondary | ICD-10-CM | POA: Insufficient documentation

## 2020-11-25 ENCOUNTER — Other Ambulatory Visit (INDEPENDENT_AMBULATORY_CARE_PROVIDER_SITE_OTHER): Payer: Self-pay | Admitting: Family Medicine

## 2020-11-25 DIAGNOSIS — F5081 Binge eating disorder: Secondary | ICD-10-CM

## 2020-11-25 NOTE — Telephone Encounter (Signed)
Dr. Juleen China and has an appointment 11/26/20

## 2020-11-26 ENCOUNTER — Other Ambulatory Visit: Payer: Self-pay

## 2020-11-26 ENCOUNTER — Ambulatory Visit (INDEPENDENT_AMBULATORY_CARE_PROVIDER_SITE_OTHER): Payer: BC Managed Care – PPO | Admitting: Family Medicine

## 2020-11-26 ENCOUNTER — Encounter (INDEPENDENT_AMBULATORY_CARE_PROVIDER_SITE_OTHER): Payer: Self-pay | Admitting: Family Medicine

## 2020-11-26 VITALS — BP 139/85 | HR 65 | Temp 97.7°F | Ht 70.0 in | Wt 338.0 lb

## 2020-11-26 DIAGNOSIS — E559 Vitamin D deficiency, unspecified: Secondary | ICD-10-CM

## 2020-11-26 DIAGNOSIS — E8881 Metabolic syndrome: Secondary | ICD-10-CM

## 2020-11-26 DIAGNOSIS — Z9884 Bariatric surgery status: Secondary | ICD-10-CM | POA: Diagnosis not present

## 2020-11-26 DIAGNOSIS — Z9189 Other specified personal risk factors, not elsewhere classified: Secondary | ICD-10-CM

## 2020-11-26 DIAGNOSIS — Z6841 Body Mass Index (BMI) 40.0 and over, adult: Secondary | ICD-10-CM

## 2020-11-27 MED ORDER — VITAMIN D (ERGOCALCIFEROL) 1.25 MG (50000 UNIT) PO CAPS
50000.0000 [IU] | ORAL_CAPSULE | ORAL | 0 refills | Status: DC
Start: 2020-11-27 — End: 2020-12-23

## 2020-11-27 MED ORDER — OZEMPIC (1 MG/DOSE) 4 MG/3ML ~~LOC~~ SOPN: 1.0000 mg | PEN_INJECTOR | SUBCUTANEOUS | 0 refills | Status: DC

## 2020-11-28 NOTE — Progress Notes (Signed)
Chief Complaint:   OBESITY [name] is here to discuss her progress with her obesity treatment plan along with follow-up of her obesity related diagnoses.   Today's visit was #: 8 Starting weight: 372 lbs Starting date: 07/04/2020 Today's weight: 338 lbs Today's date: 11/26/2020 Total lbs lost to date: 34 lbs Body mass index is 48.5 kg/m.  Total weight loss percentage to date: -9.14%  Interim History: Alyssa Cain is down 34 pounds total today.  She says she is having no issues with hunger or cravings. Nutrition Plan: keeping a food journal and adhering to recommended goals of 1500 calories and 100 grams of protein for 100% of the time.  Anti-obesity medications: Ozempic. Reported side effects: None. Hunger is well controlled. Cravings are well controlled.  Activity: Alyssa Cain says she has been doing more walking lately.  Assessment/Plan:   1. Vitamin D deficiency Not at goal. Current vitamin D is 22.5, tested on 10/02/2020. Optimal goal > 50 ng/dL.   Plan:  [x]   Continue Vitamin D @50 ,000 IU every week. []   Continue home supplement daily. [x]   Follow-up for routine testing of Vitamin D at least 2-3 times per year to avoid over-replacement.  - Refill Vitamin D, Ergocalciferol, (DRISDOL) 1.25 MG (50000 UNIT) CAPS capsule; Take 1 capsule (50,000 Units total) by mouth every 7 (seven) days.  Dispense: 12 capsule; Refill: 0  2. Metabolic syndrome Progressing well. Starting goal: Lose 7-10% of starting weight. She will continue to focus on protein-rich, low simple carbohydrate foods. We reviewed the importance of hydration, regular exercise for stress reduction, and restorative sleep.  We will continue to check lab work every 3 months, with 10% weight loss, or should any other concerns arise.  - Refill Semaglutide, 1 MG/DOSE, (OZEMPIC, 1 MG/DOSE,) 4 MG/3ML SOPN; Inject 1 mg into the skin once a week.  Dispense: 9 mL; Refill: 0  3. History of bariatric surgery Alyssa Cain is at risk for  malnutrition due to her previous bariatric surgery.   Counseling  You may need to eat 3 meals and 2 snacks, or 5 small meals each day in order to reach your protein and calorie goals.   Allow at least 15 minutes for each meal so that you can eat mindfully. Listen to your body so that you do not overeat. For most people, your sleeve or pouch will comfortably hold 4-6 ounces.  Eat foods from all food groups. This includes fruits and vegetables, grains, dairy, and meat and other proteins.  Include a protein-rich food at every meal and snack, and eat the protein food first.   You should be taking a Bariatric Multivitamin as well as calcium.   4. At risk for constipation Alyssa Cain was given approximately 9 minutes of counseling today regarding prevention of constipation.   Getting to Good Bowel Health: Your goal is to have one soft bowel movement each day. Drink at least 8 glasses of water each day. Eat plenty of fiber (goal is over 30 grams each day). It is best to get most of your fiber from dietary sources which includes leafy green vegetables, fresh fruit, and whole grains. You may need to add fiber with the help of OTC fiber supplements. These include Metamucil, Citrucel, and Benefiber. If you are still having trouble, try adding Miralax. If all of these changes do not work, contact me for further direction.  5. Class 3 severe obesity with serious comorbidity and body mass index (BMI) of 45.0 to 49.9 in adult, unspecified obesity type (Alyssa Cain)  Course: Alyssa Cain is currently in the action stage of change. As such, her goal is to continue with weight loss efforts.   Nutrition goals: She has agreed to keeping a food journal and adhering to recommended goals of 1500 calories and 95 grams of protein.   Exercise goals: For substantial health benefits, adults should do at least 150 minutes (2 hours and 30 minutes) a week of moderate-intensity, or 75 minutes (1 hour and 15 minutes) a week of  vigorous-intensity aerobic physical activity, or an equivalent combination of moderate- and vigorous-intensity aerobic activity. Aerobic activity should be performed in episodes of at least 10 minutes, and preferably, it should be spread throughout the week.  Behavioral modification strategies: increasing lean protein intake.  Alyssa Cain has agreed to follow-up with our clinic in 4 weeks. She was informed of the importance of frequent follow-up visits to maximize her success with intensive lifestyle modifications for her multiple health conditions.   Objective:   Blood pressure 139/85, pulse 65, temperature 97.7 F (36.5 C), temperature source Oral, height 5\' 10"  (1.778 m), weight (!) 338 lb (153.3 kg), SpO2 96 %. Body mass index is 48.5 kg/m.  General: Cooperative, alert, well developed, in no acute distress. HEENT: Conjunctivae and lids unremarkable. Cardiovascular: Regular rhythm.  Lungs: Normal work of breathing. Neurologic: No focal deficits.   Lab Results  Component Value Date   CREATININE 0.74 10/02/2020   BUN 8 10/02/2020   NA 141 10/02/2020   K 3.9 10/02/2020   CL 104 10/02/2020   CO2 24 10/02/2020   Lab Results  Component Value Date   ALT 30 10/02/2020   AST 27 10/02/2020   ALKPHOS 118 10/02/2020   BILITOT 0.3 10/02/2020   Lab Results  Component Value Date   HGBA1C 5.3 10/02/2020   HGBA1C 5.5 12/23/2018   Lab Results  Component Value Date   INSULIN 6.6 10/02/2020   INSULIN 11.8 07/04/2020   Lab Results  Component Value Date   TSH 3.090 10/02/2020   Lab Results  Component Value Date   CHOL 191 01/26/2020   HDL 66.10 01/26/2020   LDLCALC 106 (H) 01/26/2020   TRIG 91.0 01/26/2020   CHOLHDL 3 01/26/2020   Lab Results  Component Value Date   WBC 4.8 10/02/2020   HGB 11.2 10/02/2020   HCT 35.0 10/02/2020   MCV 89 10/02/2020   PLT 333 10/02/2020   Lab Results  Component Value Date   IRON 63 10/02/2020   TIBC 205 (L) 10/02/2020   FERRITIN 445 (H)  10/02/2020   Attestation Statements:   Reviewed by clinician on day of visit: allergies, medications, problem list, medical history, surgical history, family history, social history, and previous encounter notes.  I, Water quality scientist, CMA, am acting as transcriptionist for Briscoe Deutscher, DO  I have reviewed the above documentation for accuracy and completeness, and I agree with the above. Briscoe Deutscher, DO

## 2020-12-03 ENCOUNTER — Other Ambulatory Visit: Payer: Self-pay | Admitting: Internal Medicine

## 2020-12-04 ENCOUNTER — Ambulatory Visit: Payer: BC Managed Care – PPO | Admitting: Internal Medicine

## 2020-12-11 ENCOUNTER — Inpatient Hospital Stay: Payer: BC Managed Care – PPO

## 2020-12-11 ENCOUNTER — Encounter: Payer: Self-pay | Admitting: Internal Medicine

## 2020-12-11 ENCOUNTER — Inpatient Hospital Stay: Payer: BC Managed Care – PPO | Attending: Internal Medicine

## 2020-12-11 ENCOUNTER — Inpatient Hospital Stay: Payer: BC Managed Care – PPO | Admitting: Internal Medicine

## 2020-12-11 DIAGNOSIS — E538 Deficiency of other specified B group vitamins: Secondary | ICD-10-CM | POA: Diagnosis not present

## 2020-12-11 DIAGNOSIS — I10 Essential (primary) hypertension: Secondary | ICD-10-CM | POA: Insufficient documentation

## 2020-12-11 DIAGNOSIS — K909 Intestinal malabsorption, unspecified: Secondary | ICD-10-CM

## 2020-12-11 DIAGNOSIS — Z808 Family history of malignant neoplasm of other organs or systems: Secondary | ICD-10-CM | POA: Diagnosis not present

## 2020-12-11 DIAGNOSIS — D509 Iron deficiency anemia, unspecified: Secondary | ICD-10-CM | POA: Insufficient documentation

## 2020-12-11 DIAGNOSIS — F419 Anxiety disorder, unspecified: Secondary | ICD-10-CM | POA: Insufficient documentation

## 2020-12-11 DIAGNOSIS — Z9884 Bariatric surgery status: Secondary | ICD-10-CM | POA: Diagnosis not present

## 2020-12-11 LAB — CBC WITH DIFFERENTIAL/PLATELET
Abs Immature Granulocytes: 0 10*3/uL (ref 0.00–0.07)
Basophils Absolute: 0 10*3/uL (ref 0.0–0.1)
Basophils Relative: 0 %
Eosinophils Absolute: 0 10*3/uL (ref 0.0–0.5)
Eosinophils Relative: 1 %
HCT: 40 % (ref 36.0–46.0)
Hemoglobin: 12.8 g/dL (ref 12.0–15.0)
Immature Granulocytes: 0 %
Lymphocytes Relative: 38 %
Lymphs Abs: 2 10*3/uL (ref 0.7–4.0)
MCH: 27.9 pg (ref 26.0–34.0)
MCHC: 32 g/dL (ref 30.0–36.0)
MCV: 87.1 fL (ref 80.0–100.0)
Monocytes Absolute: 0.2 10*3/uL (ref 0.1–1.0)
Monocytes Relative: 4 %
Neutro Abs: 3.1 10*3/uL (ref 1.7–7.7)
Neutrophils Relative %: 57 %
Platelets: 322 10*3/uL (ref 150–400)
RBC: 4.59 MIL/uL (ref 3.87–5.11)
RDW: 13.6 % (ref 11.5–15.5)
WBC: 5.4 10*3/uL (ref 4.0–10.5)
nRBC: 0 % (ref 0.0–0.2)

## 2020-12-11 LAB — BASIC METABOLIC PANEL
Anion gap: 7 (ref 5–15)
BUN: 8 mg/dL (ref 6–20)
CO2: 27 mmol/L (ref 22–32)
Calcium: 8.1 mg/dL — ABNORMAL LOW (ref 8.9–10.3)
Chloride: 103 mmol/L (ref 98–111)
Creatinine, Ser: 0.66 mg/dL (ref 0.44–1.00)
GFR, Estimated: >60 mL/min (ref >60)
Glucose, Bld: 130 mg/dL — ABNORMAL HIGH (ref 70–99)
Potassium: 3.2 mmol/L — ABNORMAL LOW (ref 3.5–5.1)
Sodium: 137 mmol/L (ref 135–145)

## 2020-12-11 LAB — IRON AND TIBC
Iron: 74 ug/dL (ref 28–170)
Saturation Ratios: 26 % (ref 10.4–31.8)
TIBC: 280 ug/dL (ref 250–450)
UIBC: 206 ug/dL

## 2020-12-11 LAB — FERRITIN: Ferritin: 307 ng/mL (ref 11–307)

## 2020-12-11 LAB — VITAMIN B12: Vitamin B-12: 161 pg/mL — ABNORMAL LOW (ref 180–914)

## 2020-12-11 MED ORDER — CYANOCOBALAMIN 1000 MCG/ML IJ SOLN
1000.0000 ug | Freq: Once | INTRAMUSCULAR | Status: AC
  Administered 2020-12-11: 1000 ug via INTRAMUSCULAR
  Filled 2020-12-11: qty 1

## 2020-12-11 NOTE — Assessment & Plan Note (Addendum)
#   Iron def anemia sec to iron malabsorption-/gastric bypass-menstrual blood loss.  Hemoglobin is 12.8; NOV 2021-Iron sat- 31%; Iron studies today-pending.  Patient is asymptomatic.  Hold iron infusion today.  # B12 deficiency gastric bypass-patient not on B12 sublingual.  Proceed with B12 injection today.   # Poorly controlled HTN- poorly controlled- ? Anxiety [mom-died of brain tumor]; would recommend BP and keeping of log; brining to PCP attention.   # DISPOSITION:  # B12 injection today;  NO Venofer  # follow up in 6 months- MD; cbc/bmp/iron studies/ferritin/b12- -possible Venofer/B12 injection- dr.B

## 2020-12-11 NOTE — Progress Notes (Signed)
[facility] NOTE  Patient Care Team: McLean-Scocuzza, Nino Glow, MD as PCP - General (Internal Medicine)  CHIEF COMPLAINTS/PURPOSE OF CONSULTATION: Iron deficient anemia   HEMATOLOGY HISTORY  # IRON DEF ANEMIA- sec to gastric bypass [pcp- ferritin6/Hb-8 MCV 68; B12 -low; vit D-low]  # ? 2018 [[place]]Hx of right Calf x Eliquis 3 months  # gastric bypass [2007; [place]]   HISTORY OF PRESENTING ILLNESS:  Alyssa Cain 45 y.o.  female with a history of iron deficiency anemia secondary to gastric bypass status post IV iron infusion/b12  is here for follow-up.  Patient denies any blood in stools or black or stools.  Nausea no vomiting.  Energy is adequate.  She complains of anxiety.  Her mother passed away of brain tumor in Jul 21, 2023 of last year.  Also states her blood pressure medication was changed because of interaction Ozempic.  Review of Systems  Constitutional: Negative for chills, diaphoresis, fever and weight loss.  HENT: Negative for nosebleeds and sore throat.   Eyes: Negative for double vision.  Respiratory: Negative for cough, hemoptysis, sputum production, shortness of breath and wheezing.   Cardiovascular: Negative for chest pain, palpitations, orthopnea and leg swelling.  Gastrointestinal: Negative for abdominal pain, blood in stool, constipation, diarrhea, heartburn, melena, nausea and vomiting.  Genitourinary: Negative for dysuria, frequency and urgency.  Musculoskeletal: Negative for back pain and joint pain.  Skin: Negative.  Negative for itching and rash.  Neurological: Negative for dizziness, focal weakness, weakness and headaches.  Endo/Heme/Allergies: Does not bruise/bleed easily.  Psychiatric/Behavioral: Negative for depression. The patient is not nervous/anxious.     MEDICAL HISTORY:  Past Medical History:  Diagnosis Date  . Anemia   . Anxiety   . Depression   . Fibroid   . History of blood clots   . Hypertension   . Iron  deficiency anemia   . Right leg DVT ([place])     SURGICAL HISTORY: Past Surgical History:  Procedure Laterality Date  . GASTRIC BYPASS     2007  . LAPAROSCOPIC TOTAL HYSTERECTOMY     removed cervix/uterus, removal of b/l tubes and ovary(s) fibroid 09/04/19 [facility] Dr. Delle Reining, Iona Coach MD; no need for future paps as of 11/22/19     SOCIAL HISTORY: Social History   Socioeconomic History  . Marital status: Married    Spouse name: Not on file  . Number of children: Not on file  . Years of education: Not on file  . Highest education level: Not on file  Occupational History  . Not on file  Tobacco Use  . Smoking status: Never Smoker  . Smokeless tobacco: Never Used  Substance and Sexual Activity  . Alcohol use: Not Currently  . Drug use: Not Currently  . Sexual activity: Yes    Comment: men  Other Topics Concern  . Not on file  Social History Narrative   Married Cytogeneticist    2 kids 69 and 81 y.o as of 12/20/2018    Moved from [place] to [place] to [place]    Owns guns, wears seat belt, safe in relationship    3 pregnancies 2 live births       Work at [employer] in [employer].    Social Determinants of Health   Financial Resource Strain: Not on file  Food Insecurity: Not on file  Transportation Needs: Not on file  Physical Activity: Not on file  Stress: Not on file  Social Connections: Not on file  Intimate Partner Violence:  Not on file    FAMILY HISTORY: Family History  Problem Relation Age of Onset  . Hypertension Mother   . Cancer Mother        brain  . Hyperlipidemia Mother   . Obesity Mother   . Hyperlipidemia Father   . Diabetes Maternal Grandmother     ALLERGIES:  is allergic to trazodone and nefazodone.  MEDICATIONS:  Current Outpatient Medications  Medication Sig Dispense Refill  . buPROPion (WELLBUTRIN SR) 150 MG 12 hr tablet Take 1 tablet (150 mg total) by mouth daily. 30 tablet 0  . DICLOFENAC SODIUM PO Take 60 mg by mouth 2 (two) times daily.     .  Insulin Pen Needle 32G X 4 MM MISC 1 each by Does not apply route once a week. 50 each 0  . lisdexamfetamine (VYVANSE) 40 MG capsule Take 1 capsule (40 mg total) by mouth every morning. 30 capsule 0  . Semaglutide, 1 MG/DOSE, (OZEMPIC, 1 MG/DOSE,) 4 MG/3ML SOPN Inject 1 mg into the skin once a week. 9 mL 0  . Vitamin D, Ergocalciferol, (DRISDOL) 1.25 MG (50000 UNIT) CAPS capsule Take 1 capsule (50,000 Units total) by mouth every 7 (seven) days. 12 capsule 0  . zolpidem (AMBIEN) 5 MG tablet Take 1 tablet (5 mg total) by mouth at bedtime as needed for sleep. 30 tablet 5   No current facility-administered medications for this visit.      PHYSICAL EXAMINATION:   Vitals:   12/11/20 1309  BP: (!) 160/104  Pulse: 70  Resp: 18  Temp: (!) 97.2 F (36.2 C)  SpO2: 100%   Filed Weights   12/11/20 1309  Weight: (!) 339 lb (153.8 kg)    Physical Exam HENT:     Head: Normocephalic and atraumatic.     Mouth/Throat:     Pharynx: No oropharyngeal exudate.  Eyes:     Pupils: Pupils are equal, round, and reactive to light.  Cardiovascular:     Rate and Rhythm: Normal rate and regular rhythm.  Pulmonary:     Effort: No respiratory distress.     Breath sounds: No wheezing.  Abdominal:     General: Bowel sounds are normal. There is no distension.     Palpations: Abdomen is soft. There is no mass.     Tenderness: There is no abdominal tenderness. There is no guarding or rebound.  Musculoskeletal:        General: No tenderness. Normal range of motion.     Cervical back: Normal range of motion and neck supple.  Skin:    General: Skin is warm.  Neurological:     Mental Status: She is alert and oriented to person, place, and time.  Psychiatric:        Mood and Affect: Affect normal.     LABORATORY DATA:  I have reviewed the data as listed Lab Results  Component Value Date   WBC 5.4 12/11/2020   HGB 12.8 12/11/2020   HCT 40.0 12/11/2020   MCV 87.1 12/11/2020   PLT 322 12/11/2020    Recent Labs    01/26/20 0807 06/10/20 1325 10/02/20 1440 12/11/20 1258  NA  --  140 141 137  K  --  3.8 3.9 3.2*  CL  --  107 104 103  CO2  --  27 24 27   GLUCOSE  --  75 80 130*  BUN  --  13 8 8   CREATININE  --  0.87 0.74 0.66  CALCIUM  --  7.7* 8.0* 8.1*  GFRNONAA  --  >60 100 >60  GFRAA  --  >60 116  --   PROT 7.0 7.1 6.0  --   ALBUMIN 3.8 3.6 3.5*  --   AST 22 35 27  --   ALT 19 39 30  --   ALKPHOS 114 90 118  --   BILITOT 0.5 0.4 0.3  --   BILIDIR 0.1  --   --   --      CT ANGIO HEAD W OR WO CONTRAST  Result Date: 11/11/2020 CLINICAL DATA:  Pulsatile tinnitus EXAM: CT ANGIOGRAPHY HEAD AND NECK TECHNIQUE: Multidetector CT imaging of the head and neck was performed using the standard protocol during bolus administration of intravenous contrast. Multiplanar CT image reconstructions and MIPs were obtained to evaluate the vascular anatomy. Carotid stenosis measurements (when applicable) are obtained utilizing NASCET criteria, using the distal internal carotid diameter as the denominator. CONTRAST:  60mL ISOVUE-370 IOPAMIDOL (ISOVUE-370) INJECTION 76% COMPARISON:  None. FINDINGS: CT HEAD Brain: There is no acute intracranial hemorrhage, mass effect, or edema. Gray-white differentiation is preserved. There is no extra-axial fluid collection. Ventricles and sulci are within normal limits in size and configuration. Vascular: There is atherosclerotic calcification at the skull base. Skull: Calvarium is unremarkable. Sinuses/Orbits: No acute finding. Other: Incidental note is made of a partially empty sella. Review of the MIP images confirms the above findings CTA NECK There is streak artifact through inferior slices due to body habitus. Aortic arch: Great vessel origins are patent but partially obscured. Right carotid system: Patent. Common carotid is partially obscured. There is no measurable stenosis at the ICA origin. Left carotid system: Patent. Common carotid is partially obscured.  There is no measurable stenosis at the ICA origin. Vertebral arteries: Patent.  Proximal portions are obscured. Skeleton: Cervical spine degenerative changes. Other neck: No mass or adenopathy. Upper chest: Included upper lungs are clear. Review of the MIP images confirms the above findings CTA HEAD Anterior circulation: Intracranial internal carotid arteries are patent. Anterior and middle cerebral arteries are patent. Posterior circulation: Intracranial vertebral arteries, basilar artery, and posterior cerebral arteries are patent. Bilateral posterior communicating arteries are present. Venous sinuses: Patent as allowed by contrast bolus timing. Review of the MIP images confirms the above findings IMPRESSION: No acute intracranial abnormality. No significant vascular abnormality or findings to account for reported symptoms. Electronically Signed   By: Macy Mis M.D.   On: 11/11/2020 15:42   CT ANGIO NECK W OR WO CONTRAST  Result Date: 11/11/2020 CLINICAL DATA:  Pulsatile tinnitus EXAM: CT ANGIOGRAPHY HEAD AND NECK TECHNIQUE: Multidetector CT imaging of the head and neck was performed using the standard protocol during bolus administration of intravenous contrast. Multiplanar CT image reconstructions and MIPs were obtained to evaluate the vascular anatomy. Carotid stenosis measurements (when applicable) are obtained utilizing NASCET criteria, using the distal internal carotid diameter as the denominator. CONTRAST:  76mL ISOVUE-370 IOPAMIDOL (ISOVUE-370) INJECTION 76% COMPARISON:  None. FINDINGS: CT HEAD Brain: There is no acute intracranial hemorrhage, mass effect, or edema. Gray-white differentiation is preserved. There is no extra-axial fluid collection. Ventricles and sulci are within normal limits in size and configuration. Vascular: There is atherosclerotic calcification at the skull base. Skull: Calvarium is unremarkable. Sinuses/Orbits: No acute finding. Other: Incidental note is made of a  partially empty sella. Review of the MIP images confirms the above findings CTA NECK There is streak artifact through inferior slices due to body habitus. Aortic arch: Great vessel origins are  patent but partially obscured. Right carotid system: Patent. Common carotid is partially obscured. There is no measurable stenosis at the ICA origin. Left carotid system: Patent. Common carotid is partially obscured. There is no measurable stenosis at the ICA origin. Vertebral arteries: Patent.  Proximal portions are obscured. Skeleton: Cervical spine degenerative changes. Other neck: No mass or adenopathy. Upper chest: Included upper lungs are clear. Review of the MIP images confirms the above findings CTA HEAD Anterior circulation: Intracranial internal carotid arteries are patent. Anterior and middle cerebral arteries are patent. Posterior circulation: Intracranial vertebral arteries, basilar artery, and posterior cerebral arteries are patent. Bilateral posterior communicating arteries are present. Venous sinuses: Patent as allowed by contrast bolus timing. Review of the MIP images confirms the above findings IMPRESSION: No acute intracranial abnormality. No significant vascular abnormality or findings to account for reported symptoms. Electronically Signed   By: Macy Mis M.D.   On: 11/11/2020 15:42    Malabsorption of iron # Iron def anemia sec to iron malabsorption-/gastric bypass-menstrual blood loss.  Hemoglobin is 12.8; NOV 2021-Iron sat- 31%; Iron studies today-pending.  Patient is asymptomatic.  Hold iron infusion today.  # B12 deficiency gastric bypass-patient not on B12 sublingual.  Proceed with B12 injection today.   # Poorly controlled HTN- poorly controlled- ? Anxiety [mom-died of brain tumor]; would recommend BP and keeping of log; brining to PCP attention.   # DISPOSITION:  # B12 injection today;  NO Venofer  # follow up in 6 months- MD; cbc/bmp/iron studies/ferritin/b12- -possible  Venofer/B12 injection- dr.B  All questions were answered. The patient knows to call the clinic with any problems, questions or concerns.    Cammie Sickle, MD 12/11/2020 3:05 PM

## 2020-12-13 ENCOUNTER — Ambulatory Visit (INDEPENDENT_AMBULATORY_CARE_PROVIDER_SITE_OTHER): Payer: BC Managed Care – PPO | Admitting: Internal Medicine

## 2020-12-13 ENCOUNTER — Encounter: Payer: Self-pay | Admitting: Internal Medicine

## 2020-12-13 ENCOUNTER — Other Ambulatory Visit: Payer: Self-pay

## 2020-12-13 VITALS — BP 122/80 | HR 72 | Temp 97.6°F | Ht 70.0 in | Wt 345.4 lb

## 2020-12-13 DIAGNOSIS — M1710 Unilateral primary osteoarthritis, unspecified knee: Secondary | ICD-10-CM | POA: Insufficient documentation

## 2020-12-13 DIAGNOSIS — F5104 Psychophysiologic insomnia: Secondary | ICD-10-CM

## 2020-12-13 DIAGNOSIS — Z6841 Body Mass Index (BMI) 40.0 and over, adult: Secondary | ICD-10-CM

## 2020-12-13 DIAGNOSIS — E876 Hypokalemia: Secondary | ICD-10-CM

## 2020-12-13 DIAGNOSIS — Z0000 Encounter for general adult medical examination without abnormal findings: Secondary | ICD-10-CM

## 2020-12-13 DIAGNOSIS — Z23 Encounter for immunization: Secondary | ICD-10-CM

## 2020-12-13 DIAGNOSIS — I10 Essential (primary) hypertension: Secondary | ICD-10-CM | POA: Diagnosis not present

## 2020-12-13 DIAGNOSIS — E538 Deficiency of other specified B group vitamins: Secondary | ICD-10-CM | POA: Diagnosis not present

## 2020-12-13 MED ORDER — NEEDLE (DISP) 25G X 1-1/2" MISC
1.0000 | 3 refills | Status: DC
Start: 2020-12-13 — End: 2022-04-16

## 2020-12-13 MED ORDER — AMLODIPINE BESYLATE 2.5 MG PO TABS: 2.5000 mg | ORAL_TABLET | Freq: Every day | ORAL | 3 refills | Status: DC

## 2020-12-13 MED ORDER — CYANOCOBALAMIN 1000 MCG/ML IJ SOLN: 1000.0000 ug | Freq: Once | INTRAMUSCULAR | 3 refills | Status: AC

## 2020-12-13 NOTE — Patient Instructions (Addendum)
Call your insurance about pill options and let me know  Monitor BP goal <130/<80   Amlodipine Tablets What is this medicine? AMLODIPINE (am LOE di peen) is a calcium channel blocker. It relaxes your blood vessels and decreases the amount of work the heart has to do. It treats high blood pressure and/or prevents chest pain (also called angina). This medicine may be used for other purposes; ask your health care provider or pharmacist if you have questions. COMMON BRAND NAME(S): Norvasc What should I tell my health care provider before I take this medicine? They need to know if you have any of these conditions:  heart disease  liver disease  an unusual or allergic reaction to amlodipine, other drugs, foods, dyes, or preservatives  pregnant or trying to get pregnant  breast-feeding How should I use this medicine? Take this medicine by mouth. Take it as directed on the prescription label at the same time every day. You can take it with or without food. If it upsets your stomach, take it with food. Keep taking it unless your health care provider tells you to stop. Talk to your health care provider about the use of this medicine in children. While it may be prescribed for children as young as 6 for selected conditions, precautions do apply. Overdosage: If you think you have taken too much of this medicine contact a poison control center or emergency room at once. NOTE: This medicine is only for you. Do not share this medicine with others. What if I miss a dose? If you miss a dose, take it as soon as you can. If it is almost time for your next dose, take only that dose. Do not take double or extra doses. What may interact with this medicine? This medicine may interact with the following medications:  clarithromycin  cyclosporine  diltiazem  itraconazole  simvastatin  tacrolimus This list may not describe all possible interactions. Give your health care provider a list of all the  medicines, herbs, non-prescription drugs, or dietary supplements you use. Also tell them if you smoke, drink alcohol, or use illegal drugs. Some items may interact with your medicine. What should I watch for while using this medicine? Visit your health care provider for regular checks on your progress. Check your blood pressure as directed. Ask your health care provider what your blood pressure should be. Also, find out when you should contact him or her. Do not treat yourself for coughs, colds, or pain while you are using this medicine without asking your health care provider for advice. Some medicines may increase your blood pressure. You may get drowsy or dizzy. Do not drive, use machinery, or do anything that needs mental alertness until you know how this medicine affects you. Do not stand up or sit up quickly, especially if you are an older patient. This reduces the risk of dizzy or fainting spells. Alcohol can make you more drowsy and dizzy. Avoid alcoholic drinks. What side effects may I notice from receiving this medicine? Side effects that you should report to your doctor or health care provider as soon as possible:  allergic reactions (skin rash, itching or hives; swelling of the face, lips, or tongue)  heart attack (trouble breathing; pain or tightness in the chest, neck, back or arms; unusually weak or tired)  low blood pressure (dizziness; feeling faint or lightheaded, falls; unusually weak or tired) Side effects that usually do not require medical attention (report these to your doctor or health care provider  if they continue or are bothersome):  facial flushing  nausea  palpitations  stomach pain  sudden weight gain  swelling of the ankles, feet, hands This list may not describe all possible side effects. Call your doctor for medical advice about side effects. You may report side effects to FDA at 1-800-FDA-1088. Where should I keep my medicine? Keep out of the reach of  children and pets. Store at room temperature between 20 and 25 degrees C (68 and 77 degrees F). Protect from light and moisture. Keep the container tightly closed. Get rid of any unused medicine after the expiration date. To get rid of medicines that are no longer needed or have expired:  Take the medicine to a medicine take-back program. Check with your pharmacy or law enforcement to find a location.  If you cannot return the medicine, check the label or package insert to see if the medicine should be thrown out in the garbage or flushed down the toilet. If you are not sure, ask your health care provider. If it is safe to put in the trash, empty the medicine out of the container. Mix the medicine with cat litter, dirt, coffee grounds, or other unwanted substance. Seal the mixture in a bag or container. Put it in the trash. NOTE: This sheet is a summary. It may not cover all possible information. If you have questions about this medicine, talk to your doctor, pharmacist, or health care provider.  2021 Elsevier/[employer] Standard (2020-09-28 14:59:47)  Vitamin B12 Deficiency Vitamin B12 deficiency occurs when the body does not have enough vitamin B12, which is an important vitamin. The body needs this vitamin:  To make red blood cells.  To make DNA. This is the genetic material inside cells.  To help the nerves work properly so they can carry messages from the brain to the body. Vitamin B12 deficiency can cause various health problems, such as a low red blood cell count (anemia) or nerve damage. What are the causes? This condition may be caused by:  Not eating enough foods that contain vitamin B12.  Not having enough stomach acid and digestive fluids to properly absorb vitamin B12 from the food that you eat.  Certain digestive system diseases that make it hard to absorb vitamin B12. These diseases include Crohn's disease, chronic pancreatitis, and cystic fibrosis.  A condition in which the  body does not make enough of a protein (intrinsic factor), resulting in too few red blood cells (pernicious anemia).  Having a surgery in which part of the stomach or small intestine is removed.  Taking certain medicines that make it hard for the body to absorb vitamin B12. These medicines include: ? Heartburn medicines (antacids and proton pump inhibitors). ? Certain antibiotic medicines. ? Some medicines that are used to treat diabetes, tuberculosis, gout, or high cholesterol. What increases the risk? The following factors may make you more likely to develop a B12 deficiency:  Being older than age 54.  Eating a vegetarian or vegan diet, especially while you are pregnant.  Eating a poor diet while you are pregnant.  Taking certain medicines.  Having alcoholism. What are the signs or symptoms? In some cases, there are no symptoms of this condition. If the condition leads to anemia or nerve damage, various symptoms can occur, such as:  Weakness.  Fatigue.  Loss of appetite.  Weight loss.  Numbness or tingling in your hands and feet.  Redness and burning of the tongue.  Confusion or memory problems.  Depression.  Sensory problems, such as color blindness, ringing in the ears, or loss of taste.  Diarrhea or constipation.  Trouble walking. If anemia is severe, symptoms can include:  Shortness of breath.  Dizziness.  Rapid heart rate (tachycardia). How is this diagnosed? This condition may be diagnosed with a blood test to measure the level of vitamin B12 in your blood. You may also have other tests, including:  A group of tests that measure certain characteristics of blood cells (complete blood count, CBC).  A blood test to measure intrinsic factor.  A procedure where a thin tube with a camera on the end is used to look into your stomach or intestines (endoscopy). Other tests may be needed to discover the cause of B12 deficiency. How is this treated? Treatment  for this condition depends on the cause. This condition may be treated by:  Changing your eating and drinking habits, such as: ? Eating more foods that contain vitamin B12. ? Drinking less alcohol or no alcohol.  Getting vitamin B12 injections.  Taking vitamin B12 supplements. Your health care provider will tell you which dosage is best for you. Follow these instructions at home: Eating and drinking  Eat lots of healthy foods that contain vitamin B12, including: ? Meats and poultry. This includes beef, pork, chicken, [place], and organ meats, such as liver. ? Seafood. This includes clams, rainbow trout, salmon, tuna, and haddock. ? Eggs. ? Cereal and dairy products that are fortified. This means that vitamin B12 has been added to the food. Check the label on the package to see if the food is fortified. The items listed above may not be a complete list of recommended foods and beverages. Contact a dietitian for more information.   General instructions  Get any injections that are prescribed by your health care provider.  Take supplements only as told by your health care provider. Follow the directions carefully.  Do not drink alcohol if your health care provider tells you not to. In some cases, you may only be asked to limit alcohol use.  Keep all follow-up visits as told by your health care provider. This is important. Contact a health care provider if:  Your symptoms come back. Get help right away if you:  Develop shortness of breath.  Have a rapid heart rate.  Have chest pain.  Become dizzy or lose consciousness. Summary  Vitamin B12 deficiency occurs when the body does not have enough vitamin B12.  The main causes of vitamin B12 deficiency include dietary deficiency, digestive diseases, pernicious anemia, and having a surgery in which part of the stomach or small intestine is removed.  In some cases, there are no symptoms of this condition. If the condition leads to  anemia or nerve damage, various symptoms can occur, such as weakness, shortness of breath, and numbness.  Treatment may include getting vitamin B12 injections or taking vitamin B12 supplements. Eat lots of healthy foods that contain vitamin B12. This information is not intended to replace advice given to you by your health care provider. Make sure you discuss any questions you have with your health care provider. Document Revised: 04/21/2019 Document Reviewed: 07/12/2018 Elsevier Patient Education  2021 [place].  Tinnitus Tinnitus refers to hearing a sound when there is no actual source for that sound. This is often described as ringing in the ears. However, people with this condition may hear a variety of noises, in one ear or in both ears. The sounds of tinnitus can be soft, loud, or  somewhere in between. Tinnitus can last for a few seconds or can be constant for days. It may go away without treatment and come back at various times. When tinnitus is constant or happens often, it can lead to other problems, such as trouble sleeping and trouble concentrating. Almost everyone experiences tinnitus at some point. Tinnitus that is long-lasting (chronic) or comes back often (recurs) may require medical attention. What are the causes? The cause of tinnitus is often not known. In some cases, it can result from:  Exposure to loud noises from machinery, music, or other sources.  An object (foreign body) stuck in the ear.  Earwax buildup.  Drinking alcohol or caffeine.  Taking certain medicines.  Age-related hearing loss. It may also be caused by medical conditions such as:  Ear or sinus infections.  High blood pressure.  Heart diseases.  Anemia.  Allergies.  Meniere's disease.  Thyroid problems.  Tumors.  A weak, bulging blood vessel (aneurysm) near the ear. What are the signs or symptoms? The main symptom of tinnitus is hearing a sound when there is no source for that  sound. It may sound like:  Buzzing.  Roaring.  Ringing.  Blowing air.  Hissing.  Whistling.  Sizzling.  Humming.  Running water.  A musical note.  Tapping. Symptoms may affect only one ear (unilateral) or both ears (bilateral). How is this diagnosed? Tinnitus is diagnosed based on your symptoms, your medical history, and a physical exam. Your health care provider may do a thorough hearing test (audiologic exam) if your tinnitus:  Is unilateral.  Causes hearing difficulties.  Lasts 6 months or longer. You may work with a health care provider who specializes in hearing disorders (audiologist). You may be asked questions about your symptoms and how they affect your daily life. You may have other tests done, such as:  CT scan.  MRI.  An imaging test of how blood flows through your blood vessels (angiogram). How is this treated? Treating an underlying medical condition can sometimes make tinnitus go away. If your tinnitus continues, other treatments may include:  Medicines.  Therapy and counseling to help you manage the stress of living with tinnitus.  Sound generators to mask the tinnitus. These include: ? Tabletop sound machines that play relaxing sounds to help you fall asleep. ? Wearable devices that fit in your ear and play sounds or music. ? Acoustic neural stimulation. This involves using headphones to listen to music that contains an auditory signal. Over time, listening to this signal may change some pathways in your brain and make you less sensitive to tinnitus. This treatment is used for very severe cases when no other treatment is working.  Using hearing aids or cochlear implants if your tinnitus is related to hearing loss. Hearing aids are worn in the outer ear. Cochlear implants are surgically placed in the inner ear. Follow these instructions at home: Managing symptoms  When possible, avoid being in loud places and being exposed to loud sounds.  Wear  hearing protection, such as earplugs, when you are exposed to loud noises.  Use a white noise machine, a humidifier, or other devices to mask the sound of tinnitus.  Practice techniques for reducing stress, such as meditation, yoga, or deep breathing. Work with your health care provider if you need help with managing stress.  Sleep with your head slightly raised. This may reduce the impact of tinnitus.      General instructions  Do not use stimulants, such as nicotine, alcohol,  or caffeine. Talk with your health care provider about other stimulants to avoid. Stimulants are substances that can make you feel alert and attentive by increasing certain activities in the body (such as heart rate and blood pressure). These substances may make tinnitus worse.  Take over-the-counter and prescription medicines only as told by your health care provider.  Try to get plenty of sleep each night.  Keep all follow-up visits as told by your health care provider. This is important. Contact a health care provider if:  Your tinnitus continues for 3 weeks or longer without stopping.  You develop sudden hearing loss.  Your symptoms get worse or do not get better with home care.  You feel you are not able to manage the stress of living with tinnitus. Get help right away if:  You develop tinnitus after a head injury.  You have tinnitus along with any of the following: ? Dizziness. ? Loss of balance. ? Nausea and vomiting. ? Sudden, severe headache. These symptoms may represent a serious problem that is an emergency. Do not wait to see if the symptoms will go away. Get medical help right away. Call your local emergency services (911 in the [place] Do not drive yourself to the hospital. Summary  Tinnitus refers to hearing a sound when there is no actual source for that sound. This is often described as ringing in the ears.  Symptoms may affect only one ear (unilateral) or both ears (bilateral).  Use  a white noise machine, a humidifier, or other devices to mask the sound of tinnitus.  Do not use stimulants, such as nicotine, alcohol, or caffeine. Talk with your health care provider about other stimulants to avoid. These substances may make tinnitus worse. This information is not intended to replace advice given to you by your health care provider. Make sure you discuss any questions you have with your health care provider. Document Revised: 05/16/2019 Document Reviewed: 08/12/2017 Elsevier Patient Education  2021 [employer].

## 2020-12-13 NOTE — Progress Notes (Signed)
Chief Complaint  Patient presents with  . Follow-up   Annual  1. HTN stopped hctz 12.5 mg qd due too DDI with ozempic 1 mg weekly BP spikes at times she is not checking but will start checking at home advised xl cuff  2. Chronic knee pain R>L left knee 11/11/20 mild arthritis steroid injections and [place] injections did not help in the past and insurance will not cover ablation of the nerves which she wants and wants to know self pay cost from [facility] ortho [name] appt has 11/17/20  3. Hypokalemia  Will given high K food list  4. Morbid [facility] down 30-40 lbs and enjoying working with Dr. Juleen China wellbutrin 150 mg SR and vyvanse 40 mg she reports not helping and ozempic 1 weekly she is tolerating  5. Chronic insomnia on ambien 5 mg qhs still not sleeping will call insurance and see what else they will cover as far as sleep  Review of Systems  Constitutional: Positive for weight loss.  HENT: Negative for hearing loss.   Eyes: Negative for blurred vision.  Respiratory: Negative for shortness of breath.   Cardiovascular: Negative for chest pain.  Gastrointestinal: Negative for abdominal pain.  Musculoskeletal: Positive for joint pain. Negative for falls.  Skin: Negative for rash.  Neurological: Negative for headaches.  Psychiatric/Behavioral: The patient has insomnia.    Past Medical History:  Diagnosis Date  . Anemia   . Anxiety   . Depression   . Fibroid   . History of blood clots   . Hypertension   . Iron deficiency anemia   . Right leg DVT [facility])    Past Surgical History:  Procedure Laterality Date  . GASTRIC BYPASS     2007  . LAPAROSCOPIC TOTAL HYSTERECTOMY     removed cervix/uterus, removal of b/l tubes and ovary(s) fibroid 09/04/19 [facility] Dr. Delle Reining, Iona Coach MD; no need for future paps as of 11/22/19    Family History  Problem Relation Age of Onset  . Hypertension Mother   . Cancer Mother        brain  . Hyperlipidemia Mother    . Obesity Mother   . Hyperlipidemia Father   . Diabetes Maternal Grandmother    Social History   Socioeconomic History  . Marital status: Married    Spouse name: Not on file  . Number of children: Not on file  . Years of education: Not on file  . Highest education level: Not on file  Occupational History  . Not on file  Tobacco Use  . Smoking status: Never Smoker  . Smokeless tobacco: Never Used  Substance and Sexual Activity  . Alcohol use: Not Currently  . Drug use: Not Currently  . Sexual activity: Yes    Comment: men  Other Topics Concern  . Not on file  Social History Narrative   Married Cytogeneticist    2 kids 17 and 66 y.o as of 12/20/2018    Moved from [place] to [place] to [place]    Owns guns, wears seat belt, safe in relationship    3 pregnancies 2 live births       Work at [employer] in [employer].    Social Determinants of Health   Financial Resource Strain: Not on file  Food Insecurity: Not on file  Transportation Needs: Not on file  Physical Activity: Not on file  Stress: Not on file  Social Connections: Not on file  Intimate Partner Violence: Not  on file   Current Meds  Medication Sig  . amLODipine (NORVASC) 2.5 MG tablet Take 1 tablet (2.5 mg total) by mouth daily. In am  . buPROPion (WELLBUTRIN SR) 150 MG 12 hr tablet Take 1 tablet (150 mg total) by mouth daily.  . cyanocobalamin (,VITAMIN B-12,) 1000 MCG/ML injection Inject 1 mL (1,000 mcg total) into the muscle once for 1 dose.  Marland Kitchen DICLOFENAC SODIUM PO Take 60 mg by mouth 2 (two) times daily.   . Insulin Pen Needle 32G X 4 MM MISC 1 each by Does not apply route once a week.  . lisdexamfetamine (VYVANSE) 40 MG capsule Take 1 capsule (40 mg total) by mouth every morning.  Marland Kitchen NEEDLE, DISP, 25 G 25G X 1-1/2" MISC 1 Device by Does not apply route once a week.  . Semaglutide, 1 MG/DOSE, (OZEMPIC, 1 MG/DOSE,) 4 MG/3ML SOPN Inject 1 mg into the skin once a week.  . Vitamin D, Ergocalciferol, (DRISDOL) 1.25 MG (50000 UNIT)  CAPS capsule Take 1 capsule (50,000 Units total) by mouth every 7 (seven) days.  Marland Kitchen zolpidem (AMBIEN) 5 MG tablet Take 1 tablet (5 mg total) by mouth at bedtime as needed for sleep.   Allergies  Allergen Reactions  . Trazodone And Nefazodone     Felt weird     Recent Results (from the past 2160 hour(s))  Comprehensive metabolic panel     Status: Abnormal   Collection Time: 10/02/20  2:40 PM  Result Value Ref Range   Glucose 80 65 - 99 mg/dL   BUN 8 6 - 24 mg/dL   Creatinine, Ser 0.74 0.57 - 1.00 mg/dL   GFR calc non Af Amer 100 >59 mL/min/1.73   GFR calc Af Amer 116 >59 mL/min/1.73    Comment: **In accordance with recommendations from the NKF-ASN Task force,**   [facility] is in the process of updating its eGFR calculation to the   2021 CKD-EPI creatinine equation that estimates kidney function   without a race variable.    BUN/Creatinine Ratio 11 9 - 23   Sodium 141 134 - 144 mmol/L   Potassium 3.9 3.5 - 5.2 mmol/L   Chloride 104 96 - 106 mmol/L   CO2 24 20 - 29 mmol/L   Calcium 8.0 (L) 8.7 - 10.2 mg/dL   Total Protein 6.0 6.0 - 8.5 g/dL   Albumin 3.5 (L) 3.8 - 4.8 g/dL   Globulin, Total 2.5 1.5 - 4.5 g/dL   Albumin/Globulin Ratio 1.4 1.2 - 2.2   Bilirubin Total 0.3 0.0 - 1.2 mg/dL   Alkaline Phosphatase 118 44 - 121 IU/L    Comment:               **Please note reference interval change**   AST 27 0 - 40 IU/L   ALT 30 0 - 32 IU/L  CBC with Differential/Platelet     Status: None   Collection Time: 10/02/20  2:40 PM  Result Value Ref Range   WBC 4.8 3.4 - 10.8 x10E3/uL   RBC 3.95 3.77 - 5.28 x10E6/uL   Hemoglobin 11.2 11.1 - 15.9 g/dL   MCV 89 79 - 97 fL   MCH 28.4 26.6 - 33.0 pg   MCHC 32.0 31.5 - 35.7 g/dL   RDW 13.5 11.7 - 15.4 %   Platelets 333 150 - 450 x10E3/uL   Neutrophils 54 Not Estab. %   Lymphs 39 Not Estab. %   Monocytes 5 Not Estab. %   Eos 2 Not Estab. %  Basos 0 Not Estab. %   Neutrophils Absolute 2.6 1.4 - 7.0 x10E3/uL   Lymphocytes Absolute 1.9 0.7  - 3.1 x10E3/uL   Monocytes Absolute 0.2 0.1 - 0.9 x10E3/uL   EOS (ABSOLUTE) 0.1 0.0 - 0.4 x10E3/uL   Basophils Absolute 0.0 0.0 - 0.2 x10E3/uL   Immature Granulocytes 0 Not Estab. %   Immature Grans (Abs) 0.0 0.0 - 0.1 x10E3/uL  Hemoglobin [name]     Status: None   Collection Time: 10/02/20  2:40 PM  Result Value Ref Range   Hgb [name] MFr Bld 5.3 4.8 - 5.6 %    Comment:          Prediabetes: 5.7 - 6.4          Diabetes: >6.4          Glycemic control for adults with diabetes: <7.0    Est. average glucose Bld gHb Est-mCnc 105 mg/dL  Insulin, random     Status: None   Collection Time: 10/02/20  2:40 PM  Result Value Ref Range   INSULIN 6.6 2.6 - 24.9 uIU/mL  VITAMIN D 25 Hydroxy (Vit-D Deficiency, Fractures)     Status: Abnormal   Collection Time: 10/02/20  2:40 PM  Result Value Ref Range   Vit D, 25-Hydroxy 22.5 (L) 30.0 - 100.0 ng/mL    Comment: Vitamin D deficiency has been defined by the Institute of Medicine and an Endocrine Society practice guideline as a level of serum 25-OH vitamin D less than 20 ng/mL (1,2). The Endocrine Society went on to further define vitamin D insufficiency as a level between 21 and 29 ng/mL (2). 1. IOM (Institute of Medicine). 2010. Dietary reference    intakes for calcium and D. [place]: The    [employer]. 2. Holick MF, Binkley Fontana, Bischoff-Ferrari HA, et al.    Evaluation, treatment, and prevention of vitamin D    deficiency: an Endocrine Society clinical practice    guideline. JCEM. 2011 Jul; 96(7):1911-30.   TSH     Status: None   Collection Time: 10/02/20  2:40 PM  Result Value Ref Range   TSH 3.090 0.450 - 4.500 uIU/mL  Anemia panel     Status: Abnormal   Collection Time: 10/02/20  2:40 PM  Result Value Ref Range   Total Iron Binding Capacity 205 (L) 250 - 450 ug/dL   UIBC 142 131 - 425 ug/dL   Iron 63 27 - 159 ug/dL   Iron Saturation 31 15 - 55 %   Vitamin B-12 248 232 - 1,245 pg/mL   Folate, Hemolysate 252.0 Not  Estab. ng/mL   Hematocrit 35.0 34.0 - 46.6 %   Folate, RBC 720 >498 ng/mL   Ferritin 445 (H) 15 - 150 ng/mL   Retic Ct Pct 1.8 0.6 - 2.6 %  Vitamin B12     Status: Abnormal   Collection Time: 12/11/20 12:58 PM  Result Value Ref Range   Vitamin B-12 161 (L) 180 - 914 pg/mL    Comment: (NOTE) This assay is not validated for testing neonatal or myeloproliferative syndrome specimens for Vitamin B12 levels. Performed at [facility], [place] [address], [place], [place] [address]   Iron and TIBC     Status: None   Collection Time: 12/11/20 12:58 PM  Result Value Ref Range   Iron 74 28 - 170 ug/dL   TIBC 280 250 - 450 ug/dL   Saturation Ratios 26 10.4 - 31.8 %   UIBC 206 ug/dL  Comment: Performed at [facility], [address] [place], [place] [address]  Ferritin     Status: None   Collection Time: 12/11/20 12:58 PM  Result Value Ref Range   Ferritin 307 11 - 307 ng/mL    Comment: Performed at [facility], [address] [place], [place] [address]  Basic metabolic panel     Status: Abnormal   Collection Time: 12/11/20 12:58 PM  Result Value Ref Range   Sodium 137 135 - 145 mmol/L   Potassium 3.2 (L) 3.5 - 5.1 mmol/L   Chloride 103 98 - 111 mmol/L   CO2 27 22 - 32 mmol/L   Glucose, Bld 130 (H) 70 - 99 mg/dL    Comment: Glucose reference range applies only to samples taken after fasting for at least 8 hours.   BUN 8 6 - 20 mg/dL   Creatinine, Ser 0.66 0.44 - 1.00 mg/dL   Calcium 8.1 (L) 8.9 - 10.3 mg/dL   GFR, Estimated >60 >60 mL/min    Comment: (NOTE) Calculated using the CKD-EPI Creatinine Equation (2021)    Anion gap 7 5 - 15    Comment: Performed at [facility], [address] [place], [place] [address]  CBC with Differential     Status: None   Collection Time: 12/11/20 12:58 PM  Result Value Ref Range   WBC 5.4 4.0 - 10.5 K/uL   RBC 4.59 3.87 - 5.11 MIL/uL   Hemoglobin 12.8 12.0 - 15.0 g/dL   HCT 40.0 36.0 - 46.0 %   MCV 87.1  80.0 - 100.0 fL   MCH 27.9 26.0 - 34.0 pg   MCHC 32.0 30.0 - 36.0 g/dL   RDW 13.6 11.5 - 15.5 %   Platelets 322 150 - 400 K/uL   nRBC 0.0 0.0 - 0.2 %   Neutrophils Relative % 57 %   Neutro Abs 3.1 1.7 - 7.7 K/uL   Lymphocytes Relative 38 %   Lymphs Abs 2.0 0.7 - 4.0 K/uL   Monocytes Relative 4 %   Monocytes Absolute 0.2 0.1 - 1.0 K/uL   Eosinophils Relative 1 %   Eosinophils Absolute 0.0 0.0 - 0.5 K/uL   Basophils Relative 0 %   Basophils Absolute 0.0 0.0 - 0.1 K/uL   Immature Granulocytes 0 %   Abs Immature Granulocytes 0.00 0.00 - 0.07 K/uL    Comment: Performed at [facility], [address] [place], [place] [address]   Objective  Body mass index is 49.56 kg/m. Wt Readings from Last 3 Encounters:  12/13/20 (!) 345 lb 6.4 oz (156.7 kg)  12/11/20 (!) 339 lb (153.8 kg)  11/26/20 (!) 338 lb (153.3 kg)   Temp Readings from Last 3 Encounters:  12/13/20 97.6 F (36.4 C) (Oral)  12/11/20 (!) 97.2 F (36.2 C) (Tympanic)  11/26/20 97.7 F (36.5 C) (Oral)   BP Readings from Last 3 Encounters:  12/13/20 122/80  12/11/20 (!) 160/104  11/26/20 139/85   Pulse Readings from Last 3 Encounters:  12/13/20 72  12/11/20 70  11/26/20 65    Physical Exam Vitals and nursing note reviewed.  Constitutional:      Appearance: Normal appearance. She is well-developed and well-groomed. She is morbidly obese.  HENT:     Head: Normocephalic and atraumatic.  Eyes:     Conjunctiva/sclera: Conjunctivae normal.     Pupils: Pupils are equal, round, and reactive to light.  Cardiovascular:     Rate and Rhythm: Normal rate and regular  rhythm.     Heart sounds: Normal heart sounds. No murmur heard.   Pulmonary:     Effort: Pulmonary effort is normal.     Breath sounds: Normal breath sounds.  Abdominal:     Tenderness: There is no abdominal tenderness.  Skin:    General: Skin is warm and dry.  Neurological:     General: No focal deficit present.     Mental Status: She is alert  and oriented to person, place, and time. Mental status is at baseline.     Gait: Gait normal.  Psychiatric:        Attention and Perception: Attention and perception normal.        Mood and Affect: Mood and affect normal.        Speech: Speech normal.        Behavior: Behavior normal. Behavior is cooperative.        Thought Content: Thought content normal.        Cognition and Memory: Cognition and memory normal.        Judgment: Judgment normal.     Assessment  Plan  Annual physical exam Needs flu shot - Plan: Flu Vaccine QUAD 6+ mos PF IM (Fluarix Quad PF) Flu shot utd 2/2 pfizer consider booster Tdap thinks had in 2018 consider in future no record LMP 12/09/2018, Pap 2018 OB/GYN in [place] get records Pap negative 10/11/17 neg pap neg HPVs/p hysterectomy -no need for further s/p hysterectomy 2020 [facility] mammo10/28/2020 negativeand orderedGIB 08/2020-pt to call to sch given info today to call  Disc colonoscopy age 57   B12 deficiency - Plan: cyanocobalamin (,VITAMIN B-12,) 1000 MCG/ML injection Q30 days, NEEDLE, DISP, 25 G 25G X 1-1/2" MISC Results for ANDE, THERRELL (MRN [id number]) as of 12/13/2020 16:55  Ref. Range 12/11/2020 12:58  Vitamin B12 Latest Ref Range: 180 - 914 pg/mL 161 (L)   Hypertension, unspecified type - Plan: amLODipine (NORVASC) 2.5 MG tablet stopped hctz 12.5 mg due to c/w DDI per pt Monitor BP   Arthritis of knee F/u [facility] 12/17/20 wants to know price of ablation self pay per [name] CC'ed fax to him today   Hypokalemia Mailed high potassium food list   Morbid obesity with BMI of 45.0-49.9, adult ([place]) Cont f/u weight loss clinic   Chronic insomnia  Pt to call and disc with pharmacy what med is covered for sleep ambien 5 mg qhs alone is not helping   Former PCP Dr. Kathe Mariner [facility]  Get records [facility] and [place] seen 01/17/18 DVT right lower ext 11/13/2016 s/p  Eliquis x 2 months and iron def anemia hbg 6.4, 7.6  -h/o low Hbg A2 1.6 range 1.8-3.2 may indicate alpha thal or iron def anemia  H/o elevated lfts AST 42 range was 14-36 Provider: Dr. Olivia Mackie McLean-[facility]ocuz[facility]

## 2020-12-16 ENCOUNTER — Encounter (INDEPENDENT_AMBULATORY_CARE_PROVIDER_SITE_OTHER): Payer: Self-pay

## 2020-12-17 DIAGNOSIS — M25461 Effusion, right knee: Secondary | ICD-10-CM | POA: Diagnosis not present

## 2020-12-17 DIAGNOSIS — M7121 Synovial cyst of popliteal space [Baker], right knee: Secondary | ICD-10-CM | POA: Diagnosis not present

## 2020-12-17 DIAGNOSIS — M1711 Unilateral primary osteoarthritis, right knee: Secondary | ICD-10-CM | POA: Diagnosis not present

## 2020-12-17 DIAGNOSIS — M25561 Pain in right knee: Secondary | ICD-10-CM | POA: Diagnosis not present

## 2020-12-23 ENCOUNTER — Encounter (INDEPENDENT_AMBULATORY_CARE_PROVIDER_SITE_OTHER): Payer: Self-pay | Admitting: Family Medicine

## 2020-12-23 ENCOUNTER — Other Ambulatory Visit: Payer: Self-pay

## 2020-12-23 ENCOUNTER — Ambulatory Visit (INDEPENDENT_AMBULATORY_CARE_PROVIDER_SITE_OTHER): Payer: BC Managed Care – PPO | Admitting: Family Medicine

## 2020-12-23 VITALS — BP 135/87 | HR 68 | Temp 98.3°F | Ht 70.0 in | Wt 340.0 lb

## 2020-12-23 DIAGNOSIS — Z9884 Bariatric surgery status: Secondary | ICD-10-CM | POA: Diagnosis not present

## 2020-12-23 DIAGNOSIS — F3289 Other specified depressive episodes: Secondary | ICD-10-CM

## 2020-12-23 DIAGNOSIS — E538 Deficiency of other specified B group vitamins: Secondary | ICD-10-CM | POA: Diagnosis not present

## 2020-12-23 DIAGNOSIS — E559 Vitamin D deficiency, unspecified: Secondary | ICD-10-CM

## 2020-12-23 DIAGNOSIS — Z9189 Other specified personal risk factors, not elsewhere classified: Secondary | ICD-10-CM

## 2020-12-23 DIAGNOSIS — E65 Localized adiposity: Secondary | ICD-10-CM | POA: Diagnosis not present

## 2020-12-23 DIAGNOSIS — F5081 Binge eating disorder: Secondary | ICD-10-CM

## 2020-12-23 DIAGNOSIS — G8929 Other chronic pain: Secondary | ICD-10-CM

## 2020-12-23 DIAGNOSIS — M25561 Pain in right knee: Secondary | ICD-10-CM

## 2020-12-23 DIAGNOSIS — Z6841 Body Mass Index (BMI) 40.0 and over, adult: Secondary | ICD-10-CM

## 2020-12-23 DIAGNOSIS — M25562 Pain in left knee: Secondary | ICD-10-CM

## 2020-12-23 MED ORDER — BUPROPION HCL ER (SR) 150 MG PO TB12: 150.0000 mg | ORAL_TABLET | Freq: Every day | ORAL | 0 refills | Status: DC

## 2020-12-23 MED ORDER — LISDEXAMFETAMINE DIMESYLATE 40 MG PO CAPS: 40.0000 mg | ORAL_CAPSULE | ORAL | 0 refills | Status: DC

## 2020-12-23 MED ORDER — VITAMIN D (ERGOCALCIFEROL) 1.25 MG (50000 UNIT) PO CAPS
50000.0000 [IU] | ORAL_CAPSULE | ORAL | 0 refills | Status: DC
Start: 2020-12-23 — End: 2021-03-31

## 2020-12-24 DIAGNOSIS — M25561 Pain in right knee: Secondary | ICD-10-CM | POA: Diagnosis not present

## 2020-12-24 DIAGNOSIS — M25461 Effusion, right knee: Secondary | ICD-10-CM | POA: Diagnosis not present

## 2020-12-24 DIAGNOSIS — M7121 Synovial cyst of popliteal space [Baker], right knee: Secondary | ICD-10-CM | POA: Diagnosis not present

## 2020-12-24 DIAGNOSIS — M1711 Unilateral primary osteoarthritis, right knee: Secondary | ICD-10-CM | POA: Diagnosis not present

## 2020-12-25 NOTE — Progress Notes (Signed)
Chief Complaint:   OBESITY Alyssa Cain is here to discuss her progress with her obesity treatment plan along with follow-up of her obesity related diagnoses.   Today's visit was #: 9 Starting weight: 372 lbs Starting date: 07/04/2020 Today's weight: 340 lbs Today's date: 12/23/2020 Total lbs lost to date: 32 lbs Body mass index is 48.78 kg/m.  Total weight loss percentage to date: -8.60%  Interim History:  Says she is out of Vyvanse and Wellbutrin.  She has been doing more emotional/mindless snacking, especially on carbs.  Bioimpedance shows that she is up 4 pounds in water weight. Nutrition Plan: keeping a food journal and adhering to recommended goals of 1500 calories and 95 grams of protein. Anti-obesity medications: Ozempic. Reported side effects: None. Activity: Walking 5 times per week.  Assessment/Plan:   1. Visceral obesity Current visceral fat rating: 18. Visceral fat rating should be < 13. Visceral adipose tissue is a hormonally active component of total body fat. This body composition phenotype is associated with medical disorders such as metabolic syndrome, cardiovascular disease and several malignancies including prostate, breast, and colorectal cancers. Starting goal: Lose 7-10% of starting weight.   2. Vitamin D deficiency Not at goal. Current vitamin D is 22.5, tested on 10/02/2020. Optimal goal > 50 ng/dL.   Plan: Continue to take prescription Vitamin D @50 ,000 IU every week as prescribed.  Follow-up for routine testing of Vitamin D, at least 2-3 times per year to avoid over-replacement.  - Refill Vitamin D, Ergocalciferol, (DRISDOL) 1.25 MG (50000 UNIT) CAPS capsule; Take 1 capsule (50,000 Units total) by mouth every 7 (seven) days.  Dispense: 12 capsule; Refill: 0  3. History of gastric bypass Alyssa Cain is taking a bariatric multivitamin.  She is at risk for malnutrition due to her previous bariatric surgery.   Counseling  You may need to eat 3 meals and 2 snacks,  or 5 small meals each day in order to reach your protein and calorie goals.   Allow at least 15 minutes for each meal so that you can eat mindfully. Listen to your body so that you do not overeat. For most people, your sleeve or pouch will comfortably hold 4-6 ounces.  Eat foods from all food groups. This includes fruits and vegetables, grains, dairy, and meat and other proteins.  Include a protein-rich food at every meal and snack, and eat the protein food first.   You should be taking a Bariatric Multivitamin as well as calcium.   4. B12 deficiency Lab Results  Component Value Date   VITAMINB12 161 (L) 12/11/2020   Supplementation: Monthly vitamin B12 injections.    Plan:  Continue current treatment.  5. Chronic pain of both knees Alyssa Cain is followed by Orthopedics.  Plan:  Will follow because mobility and pain control are important for weight management.  6. Binge eating disorder Alyssa Cain is taking Vyvanse 40 mg daily.  Will refill today, as per below.  People who binge eat feel as if they don't have control over how much they eat and have feelings of guilt or self-loathing after a binge eating episode. Alyssa Cain estimates that about 30 percent of adults with binge eating disorder also have a history of ADHD. The FDA has approved Vyvanse as a treatment option for both ADHD and binge eating. Vyvanse targets the brain's reward center by increasing the levels of dopamine and norepinephrine, the chemicals of the brain responsible for feelings of pleasure. Mindful eating is the recommended nutritional approach to treating BED.  I have consulted the [place] Controlled Substances Registry for this patient, and feel the risk/benefit ratio today is favorable for proceeding with this prescription for a controlled substance. The patient understands monitoring parameters and red flags.   - Refill lisdexamfetamine (VYVANSE) 40 MG capsule; Take 1 capsule (40 mg total) by mouth every morning.   Dispense: 30 capsule; Refill: 0  7. Other depression, with emotional eating Controlled. Medication: Wellbutrin 150 mg daily.  Plan:  Continue Wellbutrin.  Behavior modification techniques were discussed today to help deal with emotional/non-hunger eating behaviors.   - Refill buPROPion (WELLBUTRIN SR) 150 MG 12 hr tablet; Take 1 tablet (150 mg total) by mouth daily.  Dispense: 30 tablet; Refill: 0  8. At risk for constipation Alyssa Cain is at increased risk for constipation due to inadequate water intake, changes in diet, and/or use of certain medications. Patient was provided with 9 minutes of counseling today regarding this condition and related ones.  We discussed preventative OTC therapies that exist such as MiraLAX, fiber supplements, etc.   9. Class 3 severe obesity with serious comorbidity and body mass index (BMI) of 45.0 to 49.9 in adult, unspecified obesity type ([place])  Course: Alyssa Cain is currently in the action stage of change. As such, her goal is to continue with weight loss efforts.   Nutrition goals: She has agreed to keeping a food journal and adhering to recommended goals of 1500 calories and 95 grams of protein.   Exercise goals: For substantial health benefits, adults should do at least 150 minutes (2 hours and 30 minutes) a week of moderate-intensity, or 75 minutes (1 hour and 15 minutes) a week of vigorous-intensity aerobic physical activity, or an equivalent combination of moderate- and vigorous-intensity aerobic activity. Aerobic activity should be performed in episodes of at least 10 minutes, and preferably, it should be spread throughout the week.  Behavioral modification strategies: increasing lean protein intake, decreasing simple carbohydrates, increasing vegetables and increasing water intake.  Alyssa Cain has agreed to follow-up with our clinic in 3 weeks. She was informed of the importance of frequent follow-up visits to maximize her success with intensive lifestyle  modifications for her multiple health conditions.   Objective:   Blood pressure 135/87, pulse 68, temperature 98.3 F (36.8 C), temperature source Oral, height 5\' 10"  (1.778 m), weight (!) 340 lb (154.2 kg), SpO2 99 %. Body mass index is 48.78 kg/m.  General: Cooperative, alert, well developed, in no acute distress. HEENT: Conjunctivae and lids unremarkable. Cardiovascular: Regular rhythm.  Lungs: Normal work of breathing. Neurologic: No focal deficits.   Lab Results  Component Value Date   CREATININE 0.66 12/11/2020   BUN 8 12/11/2020   NA 137 12/11/2020   K 3.2 (L) 12/11/2020   CL 103 12/11/2020   CO2 27 12/11/2020   Lab Results  Component Value Date   ALT 30 10/02/2020   AST 27 10/02/2020   ALKPHOS 118 10/02/2020   BILITOT 0.3 10/02/2020   Lab Results  Component Value Date   HGBA1C 5.3 10/02/2020   HGBA1C 5.5 12/23/2018   Lab Results  Component Value Date   INSULIN 6.6 10/02/2020   INSULIN 11.8 07/04/2020   Lab Results  Component Value Date   TSH 3.090 10/02/2020   Lab Results  Component Value Date   CHOL 191 01/26/2020   HDL 66.10 01/26/2020   LDLCALC 106 (H) 01/26/2020   TRIG 91.0 01/26/2020   CHOLHDL 3 01/26/2020   Lab Results  Component Value Date   WBC 5.4  12/11/2020   HGB 12.8 12/11/2020   HCT 40.0 12/11/2020   MCV 87.1 12/11/2020   PLT 322 12/11/2020   Lab Results  Component Value Date   IRON 74 12/11/2020   TIBC 280 12/11/2020   FERRITIN 307 12/11/2020   Attestation Statements:   Reviewed by clinician on day of visit: allergies, medications, problem list, medical history, surgical history, family history, social history, and previous encounter notes.  I, Water quality scientist, CMA, am acting as transcriptionist for Briscoe Deutscher, DO  I have reviewed the above documentation for accuracy and completeness, and I agree with the above. Briscoe Deutscher, DO

## 2021-01-22 ENCOUNTER — Encounter (INDEPENDENT_AMBULATORY_CARE_PROVIDER_SITE_OTHER): Payer: Self-pay | Admitting: Family Medicine

## 2021-01-22 ENCOUNTER — Ambulatory Visit (INDEPENDENT_AMBULATORY_CARE_PROVIDER_SITE_OTHER): Payer: BC Managed Care – PPO | Admitting: Family Medicine

## 2021-01-22 ENCOUNTER — Other Ambulatory Visit: Payer: Self-pay

## 2021-01-22 VITALS — BP 140/86 | HR 65 | Temp 98.0°F | Ht 70.0 in | Wt 330.0 lb

## 2021-01-22 DIAGNOSIS — F3289 Other specified depressive episodes: Secondary | ICD-10-CM | POA: Diagnosis not present

## 2021-01-22 DIAGNOSIS — Z9884 Bariatric surgery status: Secondary | ICD-10-CM | POA: Diagnosis not present

## 2021-01-22 DIAGNOSIS — E538 Deficiency of other specified B group vitamins: Secondary | ICD-10-CM | POA: Diagnosis not present

## 2021-01-22 DIAGNOSIS — Z9189 Other specified personal risk factors, not elsewhere classified: Secondary | ICD-10-CM

## 2021-01-22 DIAGNOSIS — F5081 Binge eating disorder: Secondary | ICD-10-CM

## 2021-01-22 DIAGNOSIS — Z6841 Body Mass Index (BMI) 40.0 and over, adult: Secondary | ICD-10-CM

## 2021-01-22 MED ORDER — LISDEXAMFETAMINE DIMESYLATE 40 MG PO CAPS: 40.0000 mg | ORAL_CAPSULE | ORAL | 0 refills | Status: DC

## 2021-01-22 MED ORDER — "SYRINGE/NEEDLE (DISP) 25G X 1"" 3 ML MISC": 0 refills | Status: DC

## 2021-01-22 MED ORDER — BUPROPION HCL ER (SR) 150 MG PO TB12
150.0000 mg | ORAL_TABLET | Freq: Every day | ORAL | 0 refills | Status: DC
Start: 2021-01-22 — End: 2021-05-01

## 2021-01-23 NOTE — Progress Notes (Signed)
Chief Complaint:   OBESITY Alyssa Cain is here to discuss her progress with her obesity treatment plan along with follow-up of her obesity related diagnoses.   Today's visit was #: 10 Starting weight: 372 lbs Starting date: 07/04/2020 Today's weight: 330 lbs Today's date: 01/22/2021 Total lbs lost to date: 42 lbs Body mass index is 47.35 kg/m.  Total weight loss percentage to date: -11.29%  Interim History:  Alyssa Cain has lost 42 pounds total.  She is continuing to stay under 1500 calories.  She is doing more walking.  Her waist circumference has gone from 57 inches to 52.5 inches.  Current Meal Plan: keeping a food journal and adhering to recommended goals of 1500 calories and 95 grams of protein for 100% of the time.  Current Exercise Plan: Walking for 15-20 minutes 3-4 times per week. Current Anti-Obesity Medications: Ozempic 1 mg subcutaneously weekly. Side effects: None.  Assessment/Plan:   1. B12 deficiency Lab Results  Component Value Date   VITAMINB12 161 (L) 12/11/2020   Ashlee is getting vitamin B12 injections.  Will refill needles today.   - Refill SYRINGE-NEEDLE, DISP, 3 ML 25G X 1" 3 ML MISC; Use to give b12 injections  Dispense: 50 each; Refill: 0  2. History of gastric bypass Alyssa Cain is at risk for malnutrition due to her previous bariatric surgery.   Counseling  You may need to eat 3 meals and 2 snacks, or 5 small meals each day in order to reach your protein and calorie goals.   Allow at least 15 minutes for each meal so that you can eat mindfully. Listen to your body so that you do not overeat. For most people, your sleeve or pouch will comfortably hold 4-6 ounces.  Eat foods from all food groups. This includes fruits and vegetables, grains, dairy, and meat and other proteins.  Include a protein-rich food at every meal and snack, and eat the protein food first.   You should be taking a Bariatric Multivitamin as well as calcium.   3. Binge eating  disorder Conley is taking Vyvanse 40 mg daily.  Plan:  Will refill Vyvanse today, as per below.  People who binge eat feel as if they don't have control over how much they eat and have feelings of guilt or self-loathing after a binge eating episode. [facility] estimates that about 30 percent of adults with binge eating disorder also have a history of ADHD. The FDA has approved Vyvanse as a treatment option for both ADHD and binge eating. Vyvanse targets the brain's reward center by increasing the levels of dopamine and norepinephrine, the chemicals of the brain responsible for feelings of pleasure. Mindful eating is the recommended nutritional approach to treating BED.   I have consulted the [place] Controlled Substances Registry for this patient, and feel the risk/benefit ratio today is favorable for proceeding with this prescription for a controlled substance. The patient understands monitoring parameters and red flags.   - Refill lisdexamfetamine (VYVANSE) 40 MG capsule; Take 1 capsule (40 mg total) by mouth every morning.  Dispense: 30 capsule; Refill: 0  4. Other depression, with emotional eating Controlled. Medication: Wellbutrin 150 mg daily.  Plan:  Continue Wellbutrin.  Behavior modification techniques were discussed today to help deal with emotional/non-hunger eating behaviors.  - Refill buPROPion (WELLBUTRIN SR) 150 MG 12 hr tablet; Take 1 tablet (150 mg total) by mouth daily.  Dispense: 90 tablet; Refill: 0  5. At risk for malnutrition Delayza was given extensive malnutrition prevention  education and counseling today of more than 9 minutes.  Counseled her that malnutrition refers to inappropriate nutrients or not the right balance of nutrients for optimal health.  Discussed with Alyssa Cain that it is absolutely possible to be malnourished but yet obese.  Risk factors, including but not limited to, inappropriate dietary choices, difficulty with obtaining food due to physical or  financial limitations, and various physical and mental health conditions were reviewed with Alyssa Cain.   6. Class 3 severe obesity with serious comorbidity and body mass index (BMI) of 45.0 to 49.9 in adult, unspecified obesity type ([place])  Course: Alyssa Cain is currently in the action stage of change. As such, her goal is to continue with weight loss efforts.   Nutrition goals: She has agreed to keeping a food journal and adhering to recommended goals of 1500 calories and 100 grams of protein.   Exercise goals: For substantial health benefits, adults should do at least 150 minutes (2 hours and 30 minutes) a week of moderate-intensity, or 75 minutes (1 hour and 15 minutes) a week of vigorous-intensity aerobic physical activity, or an equivalent combination of moderate- and vigorous-intensity aerobic activity. Aerobic activity should be performed in episodes of at least 10 minutes, and preferably, it should be spread throughout the week.  Behavioral modification strategies: increasing lean protein intake, decreasing simple carbohydrates, increasing vegetables, increasing water intake and decreasing liquid calories.  Alyssa Cain has agreed to follow-up with our clinic in 4 weeks. She was informed of the importance of frequent follow-up visits to maximize her success with intensive lifestyle modifications for her multiple health conditions.   Objective:   Blood pressure 140/86, pulse 65, temperature 98 F (36.7 C), temperature source Oral, height 5\' 10"  (1.778 m), weight (!) 330 lb (149.7 kg), SpO2 98 %. Body mass index is 47.35 kg/m.  General: Cooperative, alert, well developed, in no acute distress. HEENT: Conjunctivae and lids unremarkable. Cardiovascular: Regular rhythm.  Lungs: Normal work of breathing. Neurologic: No focal deficits.   Lab Results  Component Value Date   CREATININE 0.66 12/11/2020   BUN 8 12/11/2020   NA 137 12/11/2020   K 3.2 (L) 12/11/2020   CL 103 12/11/2020   CO2 27  12/11/2020   Lab Results  Component Value Date   ALT 30 10/02/2020   AST 27 10/02/2020   ALKPHOS 118 10/02/2020   BILITOT 0.3 10/02/2020   Lab Results  Component Value Date   HGBA1C 5.3 10/02/2020   HGBA1C 5.5 12/23/2018   Lab Results  Component Value Date   INSULIN 6.6 10/02/2020   INSULIN 11.8 07/04/2020   Lab Results  Component Value Date   TSH 3.090 10/02/2020   Lab Results  Component Value Date   CHOL 191 01/26/2020   HDL 66.10 01/26/2020   LDLCALC 106 (H) 01/26/2020   TRIG 91.0 01/26/2020   CHOLHDL 3 01/26/2020   Lab Results  Component Value Date   WBC 5.4 12/11/2020   HGB 12.8 12/11/2020   HCT 40.0 12/11/2020   MCV 87.1 12/11/2020   PLT 322 12/11/2020   Lab Results  Component Value Date   IRON 74 12/11/2020   TIBC 280 12/11/2020   FERRITIN 307 12/11/2020   Attestation Statements:   Reviewed by clinician on day of visit: allergies, medications, problem list, medical history, surgical history, family history, social history, and previous encounter notes.  I, Water quality scientist, CMA, am acting as transcriptionist for Briscoe Deutscher, DO  I have reviewed the above documentation for accuracy and  completeness, and I agree with the above. Briscoe Deutscher, DO

## 2021-03-05 ENCOUNTER — Other Ambulatory Visit: Payer: Self-pay

## 2021-03-05 ENCOUNTER — Encounter (INDEPENDENT_AMBULATORY_CARE_PROVIDER_SITE_OTHER): Payer: Self-pay | Admitting: Family Medicine

## 2021-03-05 ENCOUNTER — Ambulatory Visit (INDEPENDENT_AMBULATORY_CARE_PROVIDER_SITE_OTHER): Payer: BC Managed Care – PPO | Admitting: Family Medicine

## 2021-03-05 VITALS — BP 137/84 | HR 74 | Temp 98.4°F | Ht 70.0 in | Wt 333.0 lb

## 2021-03-05 DIAGNOSIS — R632 Polyphagia: Secondary | ICD-10-CM | POA: Diagnosis not present

## 2021-03-05 DIAGNOSIS — Z9189 Other specified personal risk factors, not elsewhere classified: Secondary | ICD-10-CM | POA: Diagnosis not present

## 2021-03-05 DIAGNOSIS — Z6841 Body Mass Index (BMI) 40.0 and over, adult: Secondary | ICD-10-CM

## 2021-03-05 DIAGNOSIS — F5081 Binge eating disorder: Secondary | ICD-10-CM | POA: Diagnosis not present

## 2021-03-05 DIAGNOSIS — I10 Essential (primary) hypertension: Secondary | ICD-10-CM

## 2021-03-05 MED ORDER — OZEMPIC (1 MG/DOSE) 4 MG/3ML ~~LOC~~ SOPN
1.0000 mg | PEN_INJECTOR | SUBCUTANEOUS | 0 refills | Status: DC
Start: 2021-03-05 — End: 2021-03-31

## 2021-03-05 MED ORDER — LISDEXAMFETAMINE DIMESYLATE 50 MG PO CAPS: 50.0000 mg | ORAL_CAPSULE | Freq: Every day | ORAL | 0 refills | Status: DC

## 2021-03-06 NOTE — Progress Notes (Signed)
Chief Complaint:   OBESITY Alyssa Cain is here to discuss her progress with her obesity treatment plan along with follow-up of her obesity related diagnoses.   Today's visit was #: 11 Starting weight: 372 lbs Starting date: 07/04/2020 Today's weight: 333 lbs Today's date: 03/05/2021 Total lbs lost to date: 39 lbs Body mass index is 47.78 kg/m.  Total weight loss percentage to date: -10.48%  Interim History:  Alyssa Cain feels that not much has changed.  She had birthday celebrations. Plan:  New IC. Current Meal Plan: keeping a food journal and adhering to recommended goals of 1500 calories and 100 grams of protein for 100% of the time.  Current Exercise Plan: Walking for 20 minutes 3-5 times per week. Current Anti-Obesity Medications: Ozempic 1 mg subcutaneously. Side effects: None.  Assessment/Plan:   1. Essential hypertension At goal. Medications: Norvasc 2.5 mg daily.   Plan: Avoid buying foods that are: processed, frozen, or prepackaged to avoid excess salt. We will continue to monitor closely alongside her PCP and/or Specialist.   BP Readings from Last 3 Encounters:  03/05/21 137/84  01/22/21 140/86  12/23/20 135/87   Lab Results  Component Value Date   CREATININE 0.66 12/11/2020   2. Polyphagia Controlled. Current treatment: Ozempic 1 mg subcutaneously weekly. Polyphagia refers to excessive feelings of hunger. She will continue to focus on protein-rich, low simple carbohydrate foods. We reviewed the importance of hydration, regular exercise for stress reduction, and restorative sleep.  - Refill Semaglutide, 1 MG/DOSE, (OZEMPIC, 1 MG/DOSE,) 4 MG/3ML SOPN; Inject 1 mg into the skin once a week.  Dispense: 9 mL; Refill: 0  3. Binge eating disorder Alyssa Cain is taking Vyvanse 40 mg daily.  Plan:  Increase Vyvanse to 50 mg daily, as per below.  People who binge eat feel as if they don't have control over how much they eat and have feelings of guilt or self-loathing after a  binge eating episode. [place] estimates that about 30 percent of adults with binge eating disorder also have a history of ADHD. The FDA has approved Vyvanse as a treatment option for both ADHD and binge eating. Vyvanse targets the brain's reward center by increasing the levels of dopamine and norepinephrine, the chemicals of the brain responsible for feelings of pleasure. Mindful eating is the recommended nutritional approach to treating BED.   I have consulted the [place] Controlled Substances Registry for this patient, and feel the risk/benefit ratio today is favorable for proceeding with this prescription for a controlled substance. The patient understands monitoring parameters and red flags.   - Increase lisdexamfetamine (VYVANSE) 50 MG capsule; Take 1 capsule (50 mg total) by mouth daily.  Dispense: 30 capsule; Refill: 0  4. At risk for impaired metabolic function Due to [name]'s current state of health and medical condition(s), she is at a significantly higher risk for impaired metabolic function.   At least 8 minutes was spent on counseling Alyssa Cain about these concerns today.  This places the patient at a much greater risk to subsequently develop cardio-pulmonary conditions that can negatively affect the patient's quality of life.  I stressed the importance of reversing these risks factors.    5. Obesity, current BMI 47.9  Course: Alyssa Cain is currently in the action stage of change. As such, her goal is to continue with weight loss efforts.   Nutrition goals: She has agreed to keeping a food journal and adhering to recommended goals of 1500 calories and 100 grams of protein.  May consider 1200-1500  calories until new IC.   Exercise goals: For substantial health benefits, adults should do at least 150 minutes (2 hours and 30 minutes) a week of moderate-intensity, or 75 minutes (1 hour and 15 minutes) a week of vigorous-intensity aerobic physical activity, or an equivalent combination of  moderate- and vigorous-intensity aerobic activity. Aerobic activity should be performed in episodes of at least 10 minutes, and preferably, it should be spread throughout the week.  Behavioral modification strategies: increasing lean protein intake, decreasing simple carbohydrates, increasing vegetables and increasing water intake.  [name] has agreed to follow-up with our clinic in 3 weeks. She was informed of the importance of frequent follow-up visits to maximize her success with intensive lifestyle modifications for her multiple health conditions.   Objective:   Blood pressure 137/84, pulse 74, temperature 98.4 F (36.9 C), temperature source Oral, height 5\' 10"  (1.778 m), weight (!) 333 lb (151 kg), SpO2 99 %. Body mass index is 47.78 kg/m.  General: Cooperative, alert, well developed, in no acute distress. HEENT: Conjunctivae and lids unremarkable. Cardiovascular: Regular rhythm.  Lungs: Normal work of breathing. Neurologic: No focal deficits.   Lab Results  Component Value Date   CREATININE 0.66 12/11/2020   BUN 8 12/11/2020   NA 137 12/11/2020   K 3.2 (L) 12/11/2020   CL 103 12/11/2020   CO2 27 12/11/2020   Lab Results  Component Value Date   ALT 30 10/02/2020   AST 27 10/02/2020   ALKPHOS 118 10/02/2020   BILITOT 0.3 10/02/2020   Lab Results  Component Value Date   HGBA1C 5.3 10/02/2020   HGBA1C 5.5 12/23/2018   Lab Results  Component Value Date   INSULIN 6.6 10/02/2020   INSULIN 11.8 07/04/2020   Lab Results  Component Value Date   TSH 3.090 10/02/2020   Lab Results  Component Value Date   CHOL 191 01/26/2020   HDL 66.10 01/26/2020   LDLCALC 106 (H) 01/26/2020   TRIG 91.0 01/26/2020   CHOLHDL 3 01/26/2020   Lab Results  Component Value Date   WBC 5.4 12/11/2020   HGB 12.8 12/11/2020   HCT 40.0 12/11/2020   MCV 87.1 12/11/2020   PLT 322 12/11/2020   Lab Results  Component Value Date   IRON 74 12/11/2020   TIBC 280 12/11/2020   FERRITIN 307  12/11/2020   Attestation Statements:   Reviewed by clinician on day of visit: allergies, medications, problem list, medical history, surgical history, family history, social history, and previous encounter notes.  I, Water quality scientist, CMA, am acting as transcriptionist for Briscoe Deutscher, DO  I have reviewed the above documentation for accuracy and completeness, and I agree with the above. Briscoe Deutscher, DO

## 2021-03-31 ENCOUNTER — Other Ambulatory Visit: Payer: Self-pay

## 2021-03-31 ENCOUNTER — Encounter (INDEPENDENT_AMBULATORY_CARE_PROVIDER_SITE_OTHER): Payer: Self-pay | Admitting: Family Medicine

## 2021-03-31 ENCOUNTER — Ambulatory Visit (INDEPENDENT_AMBULATORY_CARE_PROVIDER_SITE_OTHER): Payer: BC Managed Care – PPO | Admitting: Family Medicine

## 2021-03-31 VITALS — BP 137/81 | HR 67 | Temp 97.6°F | Ht 70.0 in | Wt 329.0 lb

## 2021-03-31 DIAGNOSIS — Z6841 Body Mass Index (BMI) 40.0 and over, adult: Secondary | ICD-10-CM

## 2021-03-31 DIAGNOSIS — E559 Vitamin D deficiency, unspecified: Secondary | ICD-10-CM

## 2021-03-31 DIAGNOSIS — Z9189 Other specified personal risk factors, not elsewhere classified: Secondary | ICD-10-CM | POA: Diagnosis not present

## 2021-03-31 DIAGNOSIS — E8881 Metabolic syndrome: Secondary | ICD-10-CM

## 2021-03-31 DIAGNOSIS — F5081 Binge eating disorder: Secondary | ICD-10-CM | POA: Diagnosis not present

## 2021-03-31 DIAGNOSIS — F50819 Binge eating disorder, unspecified: Secondary | ICD-10-CM

## 2021-03-31 MED ORDER — OZEMPIC (2 MG/DOSE) 8 MG/3ML ~~LOC~~ SOPN: 2.0000 mg | PEN_INJECTOR | SUBCUTANEOUS | 2 refills | Status: AC

## 2021-03-31 MED ORDER — VITAMIN D (ERGOCALCIFEROL) 1.25 MG (50000 UNIT) PO CAPS: 50000.0000 [IU] | ORAL_CAPSULE | ORAL | 0 refills | Status: DC

## 2021-03-31 MED ORDER — LISDEXAMFETAMINE DIMESYLATE 50 MG PO CAPS
50.0000 mg | ORAL_CAPSULE | Freq: Every day | ORAL | 0 refills | Status: DC
Start: 2021-03-31 — End: 2021-05-01

## 2021-04-07 ENCOUNTER — Ambulatory Visit (INDEPENDENT_AMBULATORY_CARE_PROVIDER_SITE_OTHER): Payer: BC Managed Care – PPO | Admitting: Family Medicine

## 2021-04-07 NOTE — Progress Notes (Signed)
Chief Complaint:   OBESITY Alyssa Cain is here to discuss her progress with her obesity treatment plan along with follow-up of her obesity related diagnoses.   Today's visit was #: 12 Starting weight: 372 lbs Starting date: 07/04/2020 Today's weight: 329 lbs Today's date: 03/31/2021 Weight change since last visit: 4 lbs Total lbs lost to date: 43 lbs Body mass index is 47.21 kg/m.  Total weight loss percentage to date: -11.56%  Interim History:  [name] is frustrated with the lack of scale movement.  She says she has less motivation and increased hunger.  She is thinking about swimming, but working on body image discomfort.  Her husband would like to come in for her next visit.  Current Meal Plan: keeping a food journal and adhering to recommended goals of 1500 calories and 100 grams of protein for 100% of the time.  Current Exercise Plan: Walking for 20 minutes 4-5 times per week. Current Anti-Obesity Medications: Ozempic 1 mg subcutaneously weekly. Side effects: None.  Assessment/Plan:    Meds ordered this encounter  Medications  . lisdexamfetamine (VYVANSE) 50 MG capsule    Sig: Take 1 capsule (50 mg total) by mouth daily.    Dispense:  30 capsule    Refill:  0  . Vitamin D, Ergocalciferol, (DRISDOL) 1.25 MG (50000 UNIT) CAPS capsule    Sig: Take 1 capsule (50,000 Units total) by mouth every 7 (seven) days.    Dispense:  12 capsule    Refill:  0  . Semaglutide, 2 MG/DOSE, (OZEMPIC, 2 MG/DOSE,) 8 MG/3ML SOPN    Sig: Inject 2 mg into the skin once a week.    Dispense:  3 mL    Refill:  2    1. Metabolic syndrome Starting goal: Lose 7-10% of starting weight. She will continue to focus on protein-rich, low simple carbohydrate foods. We reviewed the importance of hydration, regular exercise for stress reduction, and restorative sleep.  We will continue to check lab work every 3 months, with 10% weight loss, or should any other concerns arise.  Plan:  Increase Ozempic to 2 mg  subcutaneously weekly, as per below.  - Increase and refill Semaglutide, 2 MG/DOSE, (OZEMPIC, 2 MG/DOSE,) 8 MG/3ML SOPN; Inject 2 mg into the skin once a week.  Dispense: 3 mL; Refill: 2  2. Vitamin D deficiency Not at goal. Current vitamin D is 22.5, tested on 10/02/2020. Optimal goal > 50 ng/dL.   Plan: Continue to take prescription Vitamin D @50 ,000 IU every week as prescribed.  Follow-up for routine testing of Vitamin D, at least 2-3 times per year to avoid over-replacement.  - Refill Vitamin D, Ergocalciferol, (DRISDOL) 1.25 MG (50000 UNIT) CAPS capsule; Take 1 capsule (50,000 Units total) by mouth every 7 (seven) days.  Dispense: 12 capsule; Refill: 0  3. Binge eating disorder [name] is taking Vyvanse 50 mg daily.  Plan:  Continue Vyvanse at current dose.  Will refill today.   People who binge eat feel as if they don't have control over how much they eat and have feelings of guilt or self-loathing after a binge eating episode. [facility] estimates that about 30 percent of adults with binge eating disorder also have a history of ADHD. The FDA has approved Vyvanse as a treatment option for both ADHD and binge eating. Vyvanse targets the brain's reward center by increasing the levels of dopamine and norepinephrine, the chemicals of the brain responsible for feelings of pleasure. Mindful eating is the recommended nutritional approach  to treating BED.   I have consulted the [place] Controlled Substances Registry for this patient, and feel the risk/benefit ratio today is favorable for proceeding with this prescription for a controlled substance. The patient understands monitoring parameters and red flags.   - Refill lisdexamfetamine (VYVANSE) 50 MG capsule; Take 1 capsule (50 mg total) by mouth daily.  Dispense: 30 capsule; Refill: 0  4. At risk for impaired metabolic function Due to [name]'s current state of health and medical condition(s), she is at a significantly higher risk for impaired  metabolic function.   At least 8 minutes was spent on counseling [name] about these concerns today.  This places the patient at a much greater risk to subsequently develop cardio-pulmonary conditions that can negatively affect the patient's quality of life.  I stressed the importance of reversing these risks factors.    5. Obesity, current BMI 47.3  Course: [name] is currently in the action stage of change. As such, her goal is to continue with weight loss efforts.   Nutrition goals: She has agreed to keeping a food journal and adhering to recommended goals of 1500 calories and 100 grams of protein.   Exercise goals: For substantial health benefits, adults should do at least 150 minutes (2 hours and 30 minutes) a week of moderate-intensity, or 75 minutes (1 hour and 15 minutes) a week of vigorous-intensity aerobic physical activity, or an equivalent combination of moderate- and vigorous-intensity aerobic activity. Aerobic activity should be performed in episodes of at least 10 minutes, and preferably, it should be spread throughout the week.  Behavioral modification strategies: increasing lean protein intake, decreasing simple carbohydrates, increasing vegetables, increasing water intake and emotional eating strategies.  [name] has agreed to follow-up with our clinic in 4 weeks. She was informed of the importance of frequent follow-up visits to maximize her success with intensive lifestyle modifications for her multiple health conditions.   Objective:   Blood pressure 137/81, pulse 67, temperature 97.6 F (36.4 C), temperature source Oral, height 5\' 10"  (1.778 m), weight (!) 329 lb (149.2 kg), SpO2 96 %. Body mass index is 47.21 kg/m.  General: Cooperative, alert, well developed, in no acute distress. HEENT: Conjunctivae and lids unremarkable. Cardiovascular: Regular rhythm.  Lungs: Normal work of breathing. Neurologic: No focal deficits.   Lab Results  Component Value Date   CREATININE  0.66 12/11/2020   BUN 8 12/11/2020   NA 137 12/11/2020   K 3.2 (L) 12/11/2020   CL 103 12/11/2020   CO2 27 12/11/2020   Lab Results  Component Value Date   ALT 30 10/02/2020   AST 27 10/02/2020   ALKPHOS 118 10/02/2020   BILITOT 0.3 10/02/2020   Lab Results  Component Value Date   HGBA1C 5.3 10/02/2020   HGBA1C 5.5 12/23/2018   Lab Results  Component Value Date   INSULIN 6.6 10/02/2020   INSULIN 11.8 07/04/2020   Lab Results  Component Value Date   TSH 3.090 10/02/2020   Lab Results  Component Value Date   CHOL 191 01/26/2020   HDL 66.10 01/26/2020   LDLCALC 106 (H) 01/26/2020   TRIG 91.0 01/26/2020   CHOLHDL 3 01/26/2020   Lab Results  Component Value Date   WBC 5.4 12/11/2020   HGB 12.8 12/11/2020   HCT 40.0 12/11/2020   MCV 87.1 12/11/2020   PLT 322 12/11/2020   Lab Results  Component Value Date   IRON 74 12/11/2020   TIBC 280 12/11/2020   FERRITIN 307 12/11/2020   Attestation  Statements:   Reviewed by clinician on day of visit: allergies, medications, problem list, medical history, surgical history, family history, social history, and previous encounter notes.  I, Water quality scientist, CMA, am acting as transcriptionist for Briscoe Deutscher, DO  I have reviewed the above documentation for accuracy and completeness, and I agree with the above. Briscoe Deutscher, DO

## 2021-04-16 ENCOUNTER — Encounter: Payer: Self-pay | Admitting: Internal Medicine

## 2021-04-16 ENCOUNTER — Other Ambulatory Visit: Payer: Self-pay

## 2021-04-16 ENCOUNTER — Ambulatory Visit: Payer: BC Managed Care – PPO | Admitting: Internal Medicine

## 2021-04-16 VITALS — BP 138/90 | HR 73 | Temp 97.9°F | Ht 70.0 in | Wt 331.0 lb

## 2021-04-16 DIAGNOSIS — Z1389 Encounter for screening for other disorder: Secondary | ICD-10-CM

## 2021-04-16 DIAGNOSIS — G4700 Insomnia, unspecified: Secondary | ICD-10-CM | POA: Diagnosis not present

## 2021-04-16 DIAGNOSIS — F5104 Psychophysiologic insomnia: Secondary | ICD-10-CM

## 2021-04-16 DIAGNOSIS — E559 Vitamin D deficiency, unspecified: Secondary | ICD-10-CM | POA: Diagnosis not present

## 2021-04-16 DIAGNOSIS — I10 Essential (primary) hypertension: Secondary | ICD-10-CM

## 2021-04-16 DIAGNOSIS — E8809 Other disorders of plasma-protein metabolism, not elsewhere classified: Secondary | ICD-10-CM

## 2021-04-16 DIAGNOSIS — Z1231 Encounter for screening mammogram for malignant neoplasm of breast: Secondary | ICD-10-CM

## 2021-04-16 DIAGNOSIS — Z6841 Body Mass Index (BMI) 40.0 and over, adult: Secondary | ICD-10-CM

## 2021-04-16 LAB — LIPID PANEL
Cholesterol: 175 mg/dL (ref 0–200)
HDL: 73.8 mg/dL (ref >39.00)
LDL Cholesterol: 84 mg/dL (ref 0–99)
NonHDL: 100.71
Total CHOL/HDL Ratio: 2
Triglycerides: 84 mg/dL (ref 0.0–149.0)
VLDL: 16.8 mg/dL (ref 0.0–40.0)

## 2021-04-16 LAB — HEPATIC FUNCTION PANEL
ALT: 22 U/L (ref 0–35)
AST: 17 U/L (ref 0–37)
Albumin: 3.9 g/dL (ref 3.5–5.2)
Alkaline Phosphatase: 127 U/L — ABNORMAL HIGH (ref 39–117)
Bilirubin, Direct: 0.1 mg/dL (ref 0.0–0.3)
Total Bilirubin: 0.5 mg/dL (ref 0.2–1.2)
Total Protein: 6.2 g/dL (ref 6.0–8.3)

## 2021-04-16 LAB — VITAMIN D 25 HYDROXY (VIT D DEFICIENCY, FRACTURES): VITD: 25.42 ng/mL — ABNORMAL LOW (ref 30.00–100.00)

## 2021-04-16 MED ORDER — ZOLPIDEM TARTRATE ER 12.5 MG PO TBCR: 12.5000 mg | EXTENDED_RELEASE_TABLET | Freq: Every evening | ORAL | 0 refills | Status: DC | PRN

## 2021-04-16 MED ORDER — AMLODIPINE BESYLATE 5 MG PO TABS
5.0000 mg | ORAL_TABLET | Freq: Every day | ORAL | 3 refills | Status: DC
Start: 2021-04-16 — End: 2022-03-23

## 2021-04-16 NOTE — Progress Notes (Signed)
Chief Complaint  Patient presents with  . Follow-up   F/u  1. Elevated BP on norvasc 2.5 mg qd had to stop hctz due to ddi with ozempic  2. Chronic insomnia on ambien 5 mg had to take 10 mg qhs w/o ambien sleeping 4-5 hours and with 7 which is good for her had to take 10 mg to sleep 7 hours  3. b12 and iron def f/u with h/o labs sch 06/11/21  4. Obesity lost 50 lbs  Review of Systems  Constitutional: Negative for weight loss.  HENT: Negative for hearing loss.   Eyes: Negative for blurred vision.  Respiratory: Negative for shortness of breath.   Cardiovascular: Negative for chest pain.  Gastrointestinal: Negative for abdominal pain.  Musculoskeletal: Negative for falls and joint pain.  Skin: Negative for rash.  Neurological: Negative for headaches.  Psychiatric/Behavioral: Negative for depression.   Past Medical History:  Diagnosis Date  . Anemia   . Anxiety   . Depression   . Fibroid   . History of blood clots   . Hypertension   . Iron deficiency anemia   . Right leg DVT [facility])    Past Surgical History:  Procedure Laterality Date  . GASTRIC BYPASS     2007  . LAPAROSCOPIC TOTAL HYSTERECTOMY     removed cervix/uterus, removal of b/l tubes and ovary(s) fibroid 09/04/19 [facility] Dr. Delle Reining, [place] Coach MD; no need for future paps as of 11/22/19    Family History  Problem Relation Age of Onset  . Hypertension Mother   . Cancer Mother        brain  . Hyperlipidemia Mother   . Obesity Mother   . Hyperlipidemia Father   . Diabetes Maternal Grandmother    Social History   Socioeconomic History  . Marital status: Married    Spouse name: Not on file  . Number of children: Not on file  . Years of education: Not on file  . Highest education level: Not on file  Occupational History  . Not on file  Tobacco Use  . Smoking status: Never Smoker  . Smokeless tobacco: Never Used  Substance and Sexual Activity  . Alcohol use: Not Currently  . Drug use: Not Currently  .  Sexual activity: Yes    Comment: men  Other Topics Concern  . Not on file  Social History Narrative   Married Cytogeneticist    2 kids 76 and 65 y.o as of 12/20/2018    Moved from [place] to [place] to [place]    Owns guns, wears seat belt, safe in relationship    3 pregnancies 2 live births       Work at [employer] in [employer].    Social Determinants of Health   Financial Resource Strain: Not on file  Food Insecurity: Not on file  Transportation Needs: Not on file  Physical Activity: Not on file  Stress: Not on file  Social Connections: Not on file  Intimate Partner Violence: Not on file   Current Meds  Medication Sig  . buPROPion (WELLBUTRIN SR) 150 MG 12 hr tablet Take 1 tablet (150 mg total) by mouth daily.  . Cyanocobalamin (B-12 COMPLIANCE INJECTION IJ) Inject as directed every 30 (thirty) days.  Marland Kitchen DICLOFENAC SODIUM PO Take 60 mg by mouth 2 (two) times daily.   Marland Kitchen lisdexamfetamine (VYVANSE) 50 MG capsule Take 1 capsule (50 mg total) by mouth daily.  . Semaglutide, 2 MG/DOSE, (OZEMPIC, 2 MG/DOSE,) 8 MG/3ML SOPN Inject 2  mg into the skin once a week.  . SYRINGE-NEEDLE, DISP, 3 ML 25G X 1" 3 ML MISC Use to give b12 injections  . Vitamin D, Ergocalciferol, (DRISDOL) 1.25 MG (50000 UNIT) CAPS capsule Take 1 capsule (50,000 Units total) by mouth every 7 (seven) days.  Marland Kitchen zolpidem (AMBIEN CR) 12.5 MG CR tablet Take 1 tablet (12.5 mg total) by mouth at bedtime as needed for sleep. ambien 5 mg qhs is not helping  . zolpidem (AMBIEN) 5 MG tablet Take 1 tablet (5 mg total) by mouth at bedtime as needed for sleep.  . [DISCONTINUED] amLODipine (NORVASC) 2.5 MG tablet Take 1 tablet (2.5 mg total) by mouth daily. In am   Allergies  Allergen Reactions  . Trazodone And Nefazodone     Felt weird     No results found for this or any previous visit (from the past 2160 hour(s)). Objective  Body mass index is 47.49 kg/m. Wt Readings from Last 3 Encounters:  04/16/21 (!) 331 lb (150.1 kg)  03/31/21 (!) 329  lb (149.2 kg)  03/05/21 (!) 333 lb (151 kg)   Temp Readings from Last 3 Encounters:  04/16/21 97.9 F (36.6 C) (Oral)  03/31/21 97.6 F (36.4 C) (Oral)  03/05/21 98.4 F (36.9 C) (Oral)   BP Readings from Last 3 Encounters:  04/16/21 138/90  03/31/21 137/81  03/05/21 137/84   Pulse Readings from Last 3 Encounters:  04/16/21 73  03/31/21 67  03/05/21 74    Physical Exam Vitals and nursing note reviewed.  Constitutional:      Appearance: Normal appearance. She is well-developed and well-groomed. She is morbidly obese.  HENT:     Head: Normocephalic and atraumatic.  Eyes:     Conjunctiva/sclera: Conjunctivae normal.     Pupils: Pupils are equal, round, and reactive to light.  Cardiovascular:     Rate and Rhythm: Normal rate and regular rhythm.     Heart sounds: Normal heart sounds. No murmur heard.   Pulmonary:     Effort: Pulmonary effort is normal.     Breath sounds: Normal breath sounds.  Skin:    General: Skin is warm and dry.  Neurological:     General: No focal deficit present.     Mental Status: She is alert and oriented to person, place, and time. Mental status is at baseline.     Gait: Gait normal.  Psychiatric:        Attention and Perception: Attention and perception normal.        Mood and Affect: Mood and affect normal.        Speech: Speech normal.        Behavior: Behavior normal. Behavior is cooperative.        Thought Content: Thought content normal.        Cognition and Memory: Cognition and memory normal.        Judgment: Judgment normal.     Assessment  Plan  Hypertension, unspecified type - Plan: amLODipine (NORVASC) 5 MG tablet, Lipid panel Monitor BP   Insomnia, chronic  ambien cr 12.5 mg qd   Hypoalbuminemia - Plan: Hepatic function panel   Morbid obesity with BMI of 45.0-49.9, adult ([place]) F/u wt loss clinic   HM Flu shot utd 2/2 pfizer consider booster Tdap thinks had in 2018 consider in future no record LMP 12/09/2018,  Pap 2018 OB/GYN in [place] get records Pap negative 10/11/17 neg pap neg HPVs/p hysterectomy -no need for further s/p hysterectomy 2020 [facility]  mammo10/28/2020 negativeand orderedGIB 08/2020-pt to call to sch given info today to call  Disc colonoscopy age 27   B12 deficiency - Plan: cyanocobalamin (,VITAMIN B-12,) 1000 MCG/ML injection Q30 days, NEEDLE, DISP, 25 G 25G X 1-1/2" MISC Results for YVETTA, DROTAR (MRN [id number]) as of 12/13/2020 16:55  Ref. Range 12/11/2020 12:58  Vitamin B12 Latest Ref Range: 180 - 914 pg/mL 161 (L)    Provider: Dr. Olivia Mackie McLean-Scocuzza-Internal Medicine

## 2021-04-16 NOTE — Patient Instructions (Addendum)
Call and schedule mammogram call and schedule  Pfizer vaccine call and schedule booster  norvasc increase to 5 mg daily

## 2021-05-01 ENCOUNTER — Other Ambulatory Visit: Payer: Self-pay

## 2021-05-01 ENCOUNTER — Ambulatory Visit (INDEPENDENT_AMBULATORY_CARE_PROVIDER_SITE_OTHER): Payer: BC Managed Care – PPO | Admitting: Family Medicine

## 2021-05-01 ENCOUNTER — Encounter (INDEPENDENT_AMBULATORY_CARE_PROVIDER_SITE_OTHER): Payer: Self-pay | Admitting: Family Medicine

## 2021-05-01 ENCOUNTER — Encounter: Payer: Self-pay | Admitting: Internal Medicine

## 2021-05-01 VITALS — BP 140/90 | HR 79 | Temp 97.7°F | Ht 70.0 in | Wt 321.0 lb

## 2021-05-01 DIAGNOSIS — E8881 Metabolic syndrome: Secondary | ICD-10-CM | POA: Diagnosis not present

## 2021-05-01 DIAGNOSIS — Z6841 Body Mass Index (BMI) 40.0 and over, adult: Secondary | ICD-10-CM

## 2021-05-01 DIAGNOSIS — F3289 Other specified depressive episodes: Secondary | ICD-10-CM | POA: Diagnosis not present

## 2021-05-01 DIAGNOSIS — F5081 Binge eating disorder: Secondary | ICD-10-CM | POA: Diagnosis not present

## 2021-05-01 DIAGNOSIS — Z9189 Other specified personal risk factors, not elsewhere classified: Secondary | ICD-10-CM

## 2021-05-01 DIAGNOSIS — Z9884 Bariatric surgery status: Secondary | ICD-10-CM

## 2021-05-01 MED ORDER — BUPROPION HCL ER (SR) 150 MG PO TB12: 150.0000 mg | ORAL_TABLET | Freq: Every day | ORAL | 0 refills | Status: DC

## 2021-05-01 MED ORDER — LISDEXAMFETAMINE DIMESYLATE 60 MG PO CAPS: 60.0000 mg | ORAL_CAPSULE | ORAL | 0 refills | Status: DC

## 2021-05-01 NOTE — Progress Notes (Signed)
Chief Complaint:   OBESITY Alyssa Cain is here to discuss her progress with her obesity treatment plan along with follow-up of her obesity related diagnoses. See Medical Weight Management Flowsheet for bioelectrical impedance results.  Today's visit was #: 49 Starting weight: 372 lbs Starting date: 07/04/2020 Today's weight: 321 lbs Today's date: 05/01/2021 Weight change since last visit: 8 lbs Total lbs lost to date: 51 lbs Body mass index is 46.06 kg/m.  Total weight loss percentage to date: -13.71%  Interim History:  Alyssa Cain says she enjoyed 1 day in the pool, but her swimsuit is too large.  She is still waiting for her new suit. Nutrition Plan: keeping a food journal and adhering to recommended goals of 1500 calories and 100 grams of protein for 100% of the time. Activity:  Increased activity.  Assessment/Plan:   1. Metabolic syndrome Starting goal: Lose 7-10% of starting weight. She will continue to focus on protein-rich, low simple carbohydrate foods. We reviewed the importance of hydration, regular exercise for stress reduction, and restorative sleep.  We will continue to check lab work every 3 months, with 10% weight loss, or should any other concerns arise.  2. Binge eating disorder Nyema is taking Vyvanse 50 mg daily.  Plan:  Increase Vyvanse to 60 mg daily.  I have consulted the [place] Controlled Substances Registry for this patient, and feel the risk/benefit ratio today is favorable for proceeding with this prescription for a controlled substance. The patient understands monitoring parameters and red flags.   People who binge eat feel as if they don't have control over how much they eat and have feelings of guilt or self-loathing after a binge eating episode. [place] estimates that about 30 percent of adults with binge eating disorder also have a history of ADHD. The FDA has approved Vyvanse as a treatment option for both ADHD and binge eating. Vyvanse targets the  brain's reward center by increasing the levels of dopamine and norepinephrine, the chemicals of the brain responsible for feelings of pleasure. Mindful eating is the recommended nutritional approach to treating BED.   - Increase and refill lisdexamfetamine (VYVANSE) 60 MG capsule; Take 1 capsule (60 mg total) by mouth every morning.  Dispense: 30 capsule; Refill: 0  3. History of gastric bypass Alyssa Cain had gastric bypass in 2007.  She is at risk for malnutrition due to her previous bariatric surgery.   Counseling You may need to eat 3 meals and 2 snacks, or 5 small meals each day in order to reach your protein and calorie goals.  Allow at least 15 minutes for each meal so that you can eat mindfully. Listen to your body so that you do not overeat. For most people, your sleeve or pouch will comfortably hold 4-6 ounces. Eat foods from all food groups. This includes fruits and vegetables, grains, dairy, and meat and other proteins. Include a protein-rich food at every meal and snack, and eat the protein food first.  You should be taking a Bariatric Multivitamin as well as calcium.   4. Other depression, with emotional eating Controlled. Medication: Wellbutrin 150 mg daily.  Plan:  Discussed cues and consequences, how thoughts affect eating, model of thoughts, feelings, and behaviors, and strategies for change by focusing on the cue. Discussed cognitive distortions, coping thoughts, and how to change your thoughts.  - Refill buPROPion (WELLBUTRIN SR) 150 MG 12 hr tablet; Take 1 tablet (150 mg total) by mouth daily.  Dispense: 90 tablet; Refill: 0  5. At  risk for heart disease Due to Alyssa Cain's current state of health and medical condition(s), she is at a higher risk for heart disease.  This puts the patient at much greater risk to subsequently develop cardiopulmonary conditions that can significantly affect patient's quality of life in a negative manner.    At least 8 minutes were spent on counseling  [name] about these concerns today. Evidence-based interventions for health behavior change were utilized today including the discussion of self monitoring techniques, problem-solving barriers, and SMART goal setting techniques.  Specifically, regarding patient's less desirable eating habits and patterns, we employed the technique of small changes when Alyssa Cain has not been able to fully commit to her prudent nutritional plan.  6. Obesity, current BMI 46.1  Course: Doniesha is currently in the action stage of change. As such, her goal is to continue with weight loss efforts.   Nutrition goals: She has agreed to keeping a food journal and adhering to recommended goals of 1500 calories and 100 protein.   Exercise goals: For substantial health benefits, adults should do at least 150 minutes (2 hours and 30 minutes) a week of moderate-intensity, or 75 minutes (1 hour and 15 minutes) a week of vigorous-intensity aerobic physical activity, or an equivalent combination of moderate- and vigorous-intensity aerobic activity. Aerobic activity should be performed in episodes of at least 10 minutes, and preferably, it should be spread throughout the week.  Behavioral modification strategies: increasing lean protein intake, decreasing simple carbohydrates, increasing vegetables, and increasing water intake.  Alyssa Cain has agreed to follow-up with our clinic in 3 weeks. She was informed of the importance of frequent follow-up visits to maximize her success with intensive lifestyle modifications for her multiple health conditions.   Objective:   Blood pressure 140/90, pulse 79, temperature 97.7 F (36.5 C), temperature source Oral, height 5\' 10"  (1.778 m), weight (!) 321 lb (145.6 kg), SpO2 95 %. Body mass index is 46.06 kg/m.  General: Cooperative, alert, well developed, in no acute distress. HEENT: Conjunctivae and lids unremarkable. Cardiovascular: Regular rhythm.  Lungs: Normal work of breathing. Neurologic: No  focal deficits.   Lab Results  Component Value Date   CREATININE 0.66 12/11/2020   BUN 8 12/11/2020   NA 137 12/11/2020   K 3.2 (L) 12/11/2020   CL 103 12/11/2020   CO2 27 12/11/2020   Lab Results  Component Value Date   ALT 22 04/16/2021   AST 17 04/16/2021   ALKPHOS 127 (H) 04/16/2021   BILITOT 0.5 04/16/2021   Lab Results  Component Value Date   HGBA1C 5.3 10/02/2020   HGBA1C 5.5 12/23/2018   Lab Results  Component Value Date   INSULIN 6.6 10/02/2020   INSULIN 11.8 07/04/2020   Lab Results  Component Value Date   TSH 3.090 10/02/2020   Lab Results  Component Value Date   CHOL 175 04/16/2021   HDL 73.80 04/16/2021   LDLCALC 84 04/16/2021   TRIG 84.0 04/16/2021   CHOLHDL 2 04/16/2021   Lab Results  Component Value Date   WBC 5.4 12/11/2020   HGB 12.8 12/11/2020   HCT 40.0 12/11/2020   MCV 87.1 12/11/2020   PLT 322 12/11/2020   Lab Results  Component Value Date   IRON 74 12/11/2020   TIBC 280 12/11/2020   FERRITIN 307 12/11/2020   Attestation Statements:   Reviewed by clinician on day of visit: allergies, medications, problem list, medical history, surgical history, family history, social history, and previous encounter notes.  I, Water quality scientist, CMA,  am acting as transcriptionist for Briscoe Deutscher, DO  I have reviewed the above documentation for accuracy and completeness, and I agree with the above. Briscoe Deutscher, DO

## 2021-05-12 ENCOUNTER — Other Ambulatory Visit: Payer: Self-pay | Admitting: Internal Medicine

## 2021-05-20 ENCOUNTER — Other Ambulatory Visit: Payer: Self-pay | Admitting: Internal Medicine

## 2021-05-20 DIAGNOSIS — G4700 Insomnia, unspecified: Secondary | ICD-10-CM

## 2021-05-20 NOTE — Telephone Encounter (Signed)
RX Refill:ambien Last Seen:04-16-21 Last ordered:11-07-20

## 2021-05-21 ENCOUNTER — Other Ambulatory Visit: Payer: Self-pay | Admitting: Internal Medicine

## 2021-05-21 DIAGNOSIS — G4700 Insomnia, unspecified: Secondary | ICD-10-CM

## 2021-05-21 MED ORDER — ZOLPIDEM TARTRATE ER 12.5 MG PO TBCR: 12.5000 mg | EXTENDED_RELEASE_TABLET | Freq: Every evening | ORAL | 5 refills | Status: DC | PRN

## 2021-05-21 MED ORDER — ZOLPIDEM TARTRATE 5 MG PO TABS: 5.0000 mg | ORAL_TABLET | Freq: Every evening | ORAL | 5 refills | Status: DC | PRN

## 2021-05-23 ENCOUNTER — Other Ambulatory Visit: Payer: Self-pay | Admitting: Sports Medicine

## 2021-05-23 ENCOUNTER — Other Ambulatory Visit (HOSPITAL_COMMUNITY): Payer: Self-pay | Admitting: Gynecology

## 2021-05-23 ENCOUNTER — Other Ambulatory Visit (HOSPITAL_COMMUNITY): Payer: Self-pay | Admitting: Sports Medicine

## 2021-05-23 DIAGNOSIS — M25561 Pain in right knee: Secondary | ICD-10-CM | POA: Diagnosis not present

## 2021-05-23 DIAGNOSIS — M25451 Effusion, right hip: Secondary | ICD-10-CM

## 2021-05-23 DIAGNOSIS — M1711 Unilateral primary osteoarthritis, right knee: Secondary | ICD-10-CM | POA: Diagnosis not present

## 2021-05-23 DIAGNOSIS — M25461 Effusion, right knee: Secondary | ICD-10-CM | POA: Diagnosis not present

## 2021-05-23 DIAGNOSIS — M7121 Synovial cyst of popliteal space [Baker], right knee: Secondary | ICD-10-CM | POA: Diagnosis not present

## 2021-05-23 DIAGNOSIS — D1621 Benign neoplasm of long bones of right lower limb: Secondary | ICD-10-CM

## 2021-06-07 ENCOUNTER — Ambulatory Visit
Admission: RE | Admit: 2021-06-07 | Discharge: 2021-06-07 | Disposition: A | Discharge status: Home / Self Care | Payer: BC Managed Care – PPO | Source: Ambulatory Visit | Attending: Sports Medicine | Admitting: Sports Medicine

## 2021-06-07 ENCOUNTER — Other Ambulatory Visit: Payer: Self-pay

## 2021-06-07 DIAGNOSIS — M25451 Effusion, right hip: Secondary | ICD-10-CM

## 2021-06-07 DIAGNOSIS — D1621 Benign neoplasm of long bones of right lower limb: Secondary | ICD-10-CM

## 2021-06-09 ENCOUNTER — Ambulatory Visit (INDEPENDENT_AMBULATORY_CARE_PROVIDER_SITE_OTHER): Payer: BC Managed Care – PPO | Admitting: Family Medicine

## 2021-06-09 ENCOUNTER — Other Ambulatory Visit: Payer: Self-pay

## 2021-06-09 ENCOUNTER — Encounter (INDEPENDENT_AMBULATORY_CARE_PROVIDER_SITE_OTHER): Payer: Self-pay | Admitting: Family Medicine

## 2021-06-09 VITALS — BP 126/82 | HR 79 | Temp 98.1°F | Ht 70.0 in | Wt 313.0 lb

## 2021-06-09 DIAGNOSIS — F5081 Binge eating disorder: Secondary | ICD-10-CM | POA: Diagnosis not present

## 2021-06-09 DIAGNOSIS — M25561 Pain in right knee: Secondary | ICD-10-CM

## 2021-06-09 DIAGNOSIS — G8929 Other chronic pain: Secondary | ICD-10-CM

## 2021-06-09 DIAGNOSIS — Z9189 Other specified personal risk factors, not elsewhere classified: Secondary | ICD-10-CM | POA: Diagnosis not present

## 2021-06-09 DIAGNOSIS — E8881 Metabolic syndrome: Secondary | ICD-10-CM

## 2021-06-09 DIAGNOSIS — Z6841 Body Mass Index (BMI) 40.0 and over, adult: Secondary | ICD-10-CM

## 2021-06-09 NOTE — Progress Notes (Signed)
Chief Complaint:   OBESITY Alyssa Cain is here to discuss her progress with her obesity treatment plan along with follow-up of her obesity related diagnoses. See Medical Weight Management Flowsheet for complete bioelectrical impedance results.  Today's visit was #: 14 Starting weight: 372 lbs Starting date: 07/04/2020 Today's weight: 313 lbs Today's date: 06/09/2021 Weight change since last visit: 8 lbs Total lbs lost to date: 59 lbs Body mass index is 44.91 kg/m.  Total weight loss percentage to date: -15.86%  Interim History:  Alyssa Cain is taking Ozempic 2 mg subcutaneously weekly.  Tolerating well, without side effects. Increased pain in right knee after working the the yard (uneven ground).  MRI last week for surveillance of chondroma.  Reviewed together.  Decreased activity.  Nutrition Plan: keeping a food journal and adhering to recommended goals of 1500 calories and 100 grams of protein. Activity:  Swimming for 60 minutes 2-3 times per week. Anti-obesity medications: Ozempic 2 mg subcutaneously weekly. Reported side effects: None.  Assessment/Plan:   1. Metabolic syndrome Starting goal: Lose 7-10% of starting weight. She will continue to focus on protein-rich, low simple carbohydrate foods. We reviewed the importance of hydration, regular exercise for stress reduction, and restorative sleep.  We will continue to check lab work every 3 months, with 10% weight loss, or should any other concerns arise.  Plan:  Stop Ozempic and start Mounjaro 5 mg subcutaneously weekly.  - Start tirzepatide [facility]) 5 MG/0.5ML Pen; Inject 5 mg into the skin once a week.  Dispense: 2 mL; Refill: 0  2. Chronic pain of right knee We reviewed her recent MRI together.  We will continue to monitor symptoms as they relate to her weight loss journey.  3. Binge eating disorder Improving. Alyssa Cain is taking Vyvanse 60 mg daily.   Plan:  The current medical regimen is effective;  continue present plan and  medications.   I have consulted the [place] Controlled Substances Registry for this patient, and feel the risk/benefit ratio today is favorable for proceeding with this prescription for a controlled substance. The patient understands monitoring parameters and red flags.  - Refill lisdexamfetamine (VYVANSE) 60 MG capsule; Take 1 capsule (60 mg total) by mouth every morning.  Dispense: 30 capsule; Refill: 0  4. At risk for activity intolerance Alyssa Cain was given approximately 8 minutes of counseling today regarding her increased risk for exercise intolerance.  We discussed patient's specific personal and medical issues that raise our concern.  She was advised of strategies to prevent injury and ways to improve her cardiopulmonary fitness levels slowly over time.    5. Obesity, current BMI 45  Course: Alyssa Cain is currently in the action stage of change. As such, her goal is to continue with weight loss efforts.   Nutrition goals: She has agreed to keeping a food journal and adhering to recommended goals of 1500 calories and 100 grams of protein.   Exercise goals:  As is.  Behavioral modification strategies: increasing lean protein intake and increasing high fiber foods.  Alyssa Cain has agreed to follow-up with our clinic in 4 weeks. She was informed of the importance of frequent follow-up visits to maximize her success with intensive lifestyle modifications for her multiple health conditions.   Objective:   Blood pressure 126/82, pulse 79, temperature 98.1 F (36.7 C), temperature source Oral, height '5\' 10"'$  (1.778 m), weight (!) 313 lb (142 kg), SpO2 98 %. Body mass index is 44.91 kg/m.  General: Cooperative, alert, well developed, in no acute distress.  HEENT: Conjunctivae and lids unremarkable. Cardiovascular: Regular rhythm.  Lungs: Normal work of breathing. Neurologic: No focal deficits.   Lab Results  Component Value Date   CREATININE 0.66 12/11/2020   BUN 8 12/11/2020   NA 137 12/11/2020    K 3.2 (L) 12/11/2020   CL 103 12/11/2020   CO2 27 12/11/2020   Lab Results  Component Value Date   ALT 22 04/16/2021   AST 17 04/16/2021   ALKPHOS 127 (H) 04/16/2021   BILITOT 0.5 04/16/2021   Lab Results  Component Value Date   HGBA1C 5.3 10/02/2020   HGBA1C 5.5 12/23/2018   Lab Results  Component Value Date   INSULIN 6.6 10/02/2020   INSULIN 11.8 07/04/2020   Lab Results  Component Value Date   TSH 3.090 10/02/2020   Lab Results  Component Value Date   CHOL 175 04/16/2021   HDL 73.80 04/16/2021   LDLCALC 84 04/16/2021   TRIG 84.0 04/16/2021   CHOLHDL 2 04/16/2021   Lab Results  Component Value Date   VD25OH 25.42 (L) 04/16/2021   VD25OH 22.5 (L) 10/02/2020   VD25OH 36.4 08/10/2019   Lab Results  Component Value Date   WBC 5.4 12/11/2020   HGB 12.8 12/11/2020   HCT 40.0 12/11/2020   MCV 87.1 12/11/2020   PLT 322 12/11/2020   Lab Results  Component Value Date   IRON 74 12/11/2020   TIBC 280 12/11/2020   FERRITIN 307 12/11/2020   Attestation Statements:   Reviewed by clinician on day of visit: allergies, medications, problem list, medical history, surgical history, family history, social history, and previous encounter notes.  I, Water quality scientist, CMA, am acting as transcriptionist for Briscoe Deutscher, DO  I have reviewed the above documentation for accuracy and completeness, and I agree with the above. Briscoe Deutscher, DO

## 2021-06-10 MED ORDER — LISDEXAMFETAMINE DIMESYLATE 60 MG PO CAPS: 60.0000 mg | ORAL_CAPSULE | ORAL | 0 refills | Status: DC

## 2021-06-10 MED ORDER — TIRZEPATIDE 5 MG/0.5ML ~~LOC~~ SOAJ: 5.0000 mg | SUBCUTANEOUS | 0 refills | Status: DC

## 2021-06-11 ENCOUNTER — Inpatient Hospital Stay: Payer: BC Managed Care – PPO | Admitting: Internal Medicine

## 2021-06-11 ENCOUNTER — Inpatient Hospital Stay: Payer: BC Managed Care – PPO | Attending: Internal Medicine

## 2021-06-11 ENCOUNTER — Inpatient Hospital Stay: Payer: BC Managed Care – PPO

## 2021-06-15 ENCOUNTER — Other Ambulatory Visit: Payer: Self-pay

## 2021-06-15 ENCOUNTER — Emergency Department: Payer: BC Managed Care – PPO

## 2021-06-15 ENCOUNTER — Emergency Department
Admission: EM | Admit: 2021-06-15 | Discharge: 2021-06-15 | Disposition: A | Discharge status: Home / Self Care | Payer: BC Managed Care – PPO | Attending: Emergency Medicine | Admitting: Emergency Medicine

## 2021-06-15 DIAGNOSIS — E876 Hypokalemia: Secondary | ICD-10-CM | POA: Diagnosis not present

## 2021-06-15 DIAGNOSIS — Z79899 Other long term (current) drug therapy: Secondary | ICD-10-CM | POA: Diagnosis not present

## 2021-06-15 DIAGNOSIS — R1031 Right lower quadrant pain: Secondary | ICD-10-CM | POA: Diagnosis not present

## 2021-06-15 DIAGNOSIS — I10 Essential (primary) hypertension: Secondary | ICD-10-CM | POA: Diagnosis not present

## 2021-06-15 LAB — COMPREHENSIVE METABOLIC PANEL
ALT: 16 U/L (ref 0–44)
AST: 23 U/L (ref 15–41)
Albumin: 3.7 g/dL (ref 3.5–5.0)
Alkaline Phosphatase: 123 U/L (ref 38–126)
Anion gap: 4 — ABNORMAL LOW (ref 5–15)
BUN: 6 mg/dL (ref 6–20)
CO2: 27 mmol/L (ref 22–32)
Calcium: 8.2 mg/dL — ABNORMAL LOW (ref 8.9–10.3)
Chloride: 107 mmol/L (ref 98–111)
Creatinine, Ser: 0.74 mg/dL (ref 0.44–1.00)
GFR, Estimated: >60 mL/min (ref >60)
Glucose, Bld: 85 mg/dL (ref 70–99)
Potassium: 3.3 mmol/L — ABNORMAL LOW (ref 3.5–5.1)
Sodium: 138 mmol/L (ref 135–145)
Total Bilirubin: 0.6 mg/dL (ref 0.3–1.2)
Total Protein: 6.8 g/dL (ref 6.5–8.1)

## 2021-06-15 LAB — CBC WITH DIFFERENTIAL/PLATELET
Abs Immature Granulocytes: 0 10*3/uL (ref 0.00–0.07)
Basophils Absolute: 0 10*3/uL (ref 0.0–0.1)
Basophils Relative: 1 %
Eosinophils Absolute: 0 10*3/uL (ref 0.0–0.5)
Eosinophils Relative: 1 %
HCT: 40.4 % (ref 36.0–46.0)
Hemoglobin: 13.1 g/dL (ref 12.0–15.0)
Immature Granulocytes: 0 %
Lymphocytes Relative: 52 %
Lymphs Abs: 2.3 10*3/uL (ref 0.7–4.0)
MCH: 29.1 pg (ref 26.0–34.0)
MCHC: 32.4 g/dL (ref 30.0–36.0)
MCV: 89.8 fL (ref 80.0–100.0)
Monocytes Absolute: 0.3 10*3/uL (ref 0.1–1.0)
Monocytes Relative: 7 %
Neutro Abs: 1.7 10*3/uL (ref 1.7–7.7)
Neutrophils Relative %: 39 %
Platelets: 321 10*3/uL (ref 150–400)
RBC: 4.5 MIL/uL (ref 3.87–5.11)
RDW: 14 % (ref 11.5–15.5)
WBC: 4.4 10*3/uL (ref 4.0–10.5)
nRBC: 0 % (ref 0.0–0.2)

## 2021-06-15 LAB — URINALYSIS, COMPLETE (UACMP) WITH MICROSCOPIC
Bacteria, UA: NONE SEEN
Bilirubin Urine: NEGATIVE
Glucose, UA: NEGATIVE mg/dL
Ketones, ur: NEGATIVE mg/dL
Leukocytes,Ua: NEGATIVE
Nitrite: NEGATIVE
Protein, ur: NEGATIVE mg/dL
Specific Gravity, Urine: 1.038 — ABNORMAL HIGH (ref 1.005–1.030)
pH: 6 (ref 5.0–8.0)

## 2021-06-15 LAB — LIPASE, BLOOD: Lipase: 30 U/L (ref 11–51)

## 2021-06-15 MED ORDER — DICYCLOMINE HCL 10 MG PO CAPS
10.0000 mg | ORAL_CAPSULE | Freq: Once | ORAL | Status: AC
  Administered 2021-06-15: 10 mg via ORAL
  Filled 2021-06-15: qty 1

## 2021-06-15 MED ORDER — MORPHINE SULFATE (PF) 4 MG/ML IV SOLN
6.0000 mg | Freq: Once | INTRAVENOUS | Status: AC
  Administered 2021-06-15: 6 mg via INTRAVENOUS
  Filled 2021-06-15: qty 2

## 2021-06-15 MED ORDER — KETOROLAC TROMETHAMINE 30 MG/ML IJ SOLN
30.0000 mg | Freq: Once | INTRAMUSCULAR | Status: AC
  Administered 2021-06-15: 30 mg via INTRAVENOUS
  Filled 2021-06-15: qty 1

## 2021-06-15 MED ORDER — DICYCLOMINE HCL 10 MG PO CAPS: 10.0000 mg | ORAL_CAPSULE | Freq: Four times a day (QID) | ORAL | 1 refills | Status: DC

## 2021-06-15 MED ORDER — IOHEXOL 350 MG/ML SOLN
100.0000 mL | Freq: Once | INTRAVENOUS | Status: AC | PRN
  Administered 2021-06-15: 100 mL via INTRAVENOUS

## 2021-06-15 NOTE — Discharge Instructions (Addendum)
Use Tylenol for pain and fevers.  Up to 1000 mg per dose, up to 4 times per day.  Do not take more than 4000 mg of Tylenol/acetaminophen within 24 hours..  Use the Bentyl medication to help with abdominal pain and cramping.   Return to the ED with any severely worsening symptoms, fevers with your symptoms.

## 2021-06-15 NOTE — ED Triage Notes (Signed)
Right lower quadrant abdominal pain that started yesterday, denies n/v/d.

## 2021-06-15 NOTE — ED Notes (Signed)
Pt given sandwich tray and ginger ale at this time.

## 2021-06-15 NOTE — ED Notes (Signed)
Dr. Yarelly Kuba at bedside.

## 2021-06-15 NOTE — ED Provider Notes (Signed)
[facility] Emergency Department Provider Note ____________________________________________   Event Date/Time   First MD Initiated Contact with Patient 06/15/21 1651     (approximate)  I have reviewed the triage vital signs and the nursing notes.  HISTORY  Chief Complaint Abdominal Pain   HPI Alyssa Cain is a 45 y.o. femalewho presents to the ED for evaluation of abd pain.   Chart review indicates history of morbid obesity, s/p gastric bypass in 2007, s/p hysterectomy.  Patient presents to the ED, accompanied by her husband, for evaluation of 2 days of RLQ abdominal pain.  She reports associated poor p.o. intake, but no additional associated symptoms.  She reports a sharp pain to her RLQ that is nonradiating, 4/10 intensity.   Denies having felt this pain before.  Reports the pain is consistent without waxing or waning.  Denies fever, nausea, emesis, diarrhea or stool changes, dysuria, vaginal discharge or bleeding.  Past Medical History:  Diagnosis Date   Anemia    Anxiety    Depression    Fibroid    History of blood clots    Hypertension    Iron deficiency anemia    Right leg DVT [facility])     Patient Active Problem List   Diagnosis Date Noted   Arthritis of knee 12/13/2020   Cerebral atherosclerosis 11/14/2020   Pulsatile tinnitus 11/14/2020   DDD (degenerative disc disease), cervical 11/14/2020   Chronic pain of left knee 08/02/2020   Adjustment disorder with mixed anxiety and depressed mood 04/12/2020   Recurrent depression ([place]) 04/12/2020   Hirsutism 04/12/2020   Primary osteoarthritis of right knee 03/05/2020   Chronic pain of right knee 02/14/2020   Fibroid 07/11/2019   Morbid obesity ([place]) 07/11/2019   Annual physical exam 07/11/2019   Malabsorption of iron 12/30/2018   B12 deficiency 12/23/2018   Vitamin D deficiency 12/23/2018   Chronic insomnia 12/20/2018   Anxiety and depression 12/20/2018   Essential hypertension  12/20/2018   History of hypertension 12/20/2018   H/O gastric bypass 12/20/2018   Iron deficiency anemia 12/20/2018   Personal history of DVT (deep vein thrombosis) 12/20/2018    Past Surgical History:  Procedure Laterality Date   GASTRIC BYPASS     2007   LAPAROSCOPIC TOTAL HYSTERECTOMY     removed cervix/uterus, removal of b/l tubes and ovary(s) fibroid 09/04/19 [facility] Dr. Delle Reining, Iona Coach MD; no need for future paps as of 11/22/19     Prior to Admission medications   Medication Sig Start Date End Date Taking? Authorizing Provider  dicyclomine (BENTYL) 10 MG capsule Take 1 capsule (10 mg total) by mouth 4 (four) times daily for 14 days. 06/15/21 06/29/21 Yes Vladimir Crofts, MD  amLODipine (NORVASC) 5 MG tablet Take 1 tablet (5 mg total) by mouth daily. In am 04/16/21   McLean-Scocuzza, Nino Glow, MD  buPROPion [facility] SR) 150 MG 12 hr tablet Take 1 tablet (150 mg total) by mouth daily. 05/01/21   Briscoe Deutscher, DO  Cyanocobalamin (B-12 COMPLIANCE INJECTION IJ) Inject as directed every 30 (thirty) days.    [provider]  DICLOFENAC SODIUM PO Take 60 mg by mouth 2 (two) times daily.     [provider]  Insulin Pen Needle 32G X 4 MM MISC 1 each by Does not apply route once a week. 07/04/20   Briscoe Deutscher, DO  lisdexamfetamine (VYVANSE) 60 MG capsule Take 1 capsule (60 mg total) by mouth every morning. 06/10/21   Briscoe Deutscher, DO  NEEDLE, DISP, 25 G 25G X 1-1/2" MISC 1 Device by Does not apply route once a week. 12/13/20   McLean-Scocuzza, Nino Glow, MD  SYRINGE-NEEDLE, DISP, 3 ML 25G X 1" 3 ML MISC Use to give b12 injections 01/22/21   Briscoe Deutscher, DO  tirzepatide [facility]) 5 MG/0.5ML Pen Inject 5 mg into the skin once a week. 06/10/21   Briscoe Deutscher, DO  Vitamin D, Ergocalciferol, (DRISDOL) 1.25 MG (50000 UNIT) CAPS capsule Take 1 capsule (50,000 Units total) by mouth every 7 (seven) days. 03/31/21   Briscoe Deutscher, DO  zolpidem (AMBIEN CR) 12.5 MG CR tablet  Take 1 tablet (12.5 mg total) by mouth at bedtime as needed for sleep. ambien 5 mg qhs is not helping 05/21/21   McLean-Scocuzza, Nino Glow, MD  zolpidem (AMBIEN) 5 MG tablet Take 1 tablet (5 mg total) by mouth at bedtime as needed for sleep. 05/21/21   McLean-Scocuzza, Nino Glow, MD    Allergies Trazodone and nefazodone  Family History  Problem Relation Age of Onset   Hypertension Mother    Cancer Mother        brain   Hyperlipidemia Mother    Obesity Mother    Hyperlipidemia Father    Diabetes Maternal Grandmother     Social History Social History   Tobacco Use   Smoking status: Never   Smokeless tobacco: Never  Substance Use Topics   Alcohol use: Not Currently   Drug use: Not Currently    Review of Systems  Constitutional: No fever/chills Eyes: No visual changes. ENT: No sore throat. Cardiovascular: Denies chest pain. Respiratory: Denies shortness of breath. Gastrointestinal: Positive for RLQ abdominal pain   No nausea, no vomiting.  No diarrhea.  No constipation. Genitourinary: Negative for dysuria. Musculoskeletal: Negative for back pain. Skin: Negative for rash. Neurological: Negative for headaches, focal weakness or numbness.  ____________________________________________   PHYSICAL EXAM:  VITAL SIGNS: Vitals:   06/15/21 1627 06/15/21 2057  BP: (!) 160/111 (!) 136/93  Pulse: 60 63  Resp: 20 18  Temp: 98.2 F (36.8 C)   SpO2: 100% 100%     Constitutional: Alert and oriented. Well appearing and in no acute distress.  Morbidly obese, conversational in full sentences. Eyes: Conjunctivae are normal. PERRL. EOMI. Head: Atraumatic. Nose: No congestion/rhinnorhea. Mouth/Throat: Mucous membranes are moist.  Oropharynx non-erythematous. Neck: No stridor. No cervical spine tenderness to palpation. Cardiovascular: Normal rate, regular rhythm. Grossly normal heart sounds.  Good peripheral circulation. Respiratory: Normal respiratory effort.  No retractions. Lungs  CTAB. Gastrointestinal: Soft , nondistended. No CVA tenderness. Difficult examination due to habitus.  Does have RLQ tenderness with voluntary guarding.  Abdomen is otherwise benign.  Rovsing and psoas negative. Musculoskeletal: No lower extremity tenderness nor edema.  No joint effusions. No signs of acute trauma. Neurologic:  Normal speech and language. No gross focal neurologic deficits are appreciated. No gait instability noted. Skin:  Skin is warm, dry and intact. No rash noted. Psychiatric: Mood and affect are normal. Speech and behavior are normal.  ____________________________________________   LABS (all labs ordered are listed, but only abnormal results are displayed)  Labs Reviewed  COMPREHENSIVE METABOLIC PANEL - Abnormal; Notable for the following components:      Result Value   Potassium 3.3 (*)    Calcium 8.2 (*)    Anion gap 4 (*)    All other components within normal limits  URINALYSIS, COMPLETE (UACMP) WITH MICROSCOPIC - Abnormal; Notable for the following components:   Color, Urine STRAW (*)  APPearance HAZY (*)    Specific Gravity, Urine 1.038 (*)    Hgb urine dipstick SMALL (*)    All other components within normal limits  CBC WITH DIFFERENTIAL/PLATELET  LIPASE, BLOOD   ____________________________________________  12 Lead EKG   ____________________________________________  RADIOLOGY  ED MD interpretation:    Official radiology report(s): CT ABDOMEN PELVIS W CONTRAST  Result Date: 06/15/2021 CLINICAL DATA:  Right lower quadrant pain and tenderness to palpation. Clinical suspicion for appendicitis. Previous hysterectomy and gastric bypass surgery. EXAM: CT ABDOMEN AND PELVIS WITH CONTRAST TECHNIQUE: Multidetector CT imaging of the abdomen and pelvis was performed using the standard protocol following bolus administration of intravenous contrast. CONTRAST:  149m OMNIPAQUE IOHEXOL 350 MG/ML SOLN COMPARISON:  None. FINDINGS: Lower Chest: No acute  findings. Hepatobiliary: No hepatic masses identified. Gallbladder is unremarkable. No evidence of biliary ductal dilatation. Pancreas:  No mass or inflammatory changes. Spleen: Within normal limits in size and appearance. Adrenals/Urinary Tract: No masses identified. No evidence of ureteral calculi or hydronephrosis. Stomach/Bowel: Previous gastric bypass surgery noted. No evidence of obstruction, inflammatory process or abnormal fluid collections. Normal appendix visualized. Vascular/Lymphatic: No pathologically enlarged lymph nodes. No acute vascular findings. Reproductive: Prior hysterectomy noted. Adnexal regions are unremarkable in appearance. Other:  None. Musculoskeletal:  No suspicious bone lesions identified. IMPRESSION: Unremarkable exam. No evidence of appendicitis or other acute findings. Electronically Signed   By: JMarlaine HindM.D.   On: 06/15/2021 18:29    ____________________________________________   PROCEDURES and INTERVENTIONS  Procedure(s) performed (including Critical Care):  Procedures  Medications  morphine 4 MG/ML injection 6 mg (6 mg Intravenous Given 06/15/21 1723)  iohexol (OMNIPAQUE) 350 MG/ML injection 100 mL (100 mLs Intravenous Contrast Given 06/15/21 1748)  dicyclomine (BENTYL) capsule 10 mg (10 mg Oral Given 06/15/21 1925)  ketorolac (TORADOL) 30 MG/ML injection 30 mg (30 mg Intravenous Given 06/15/21 2042)    ____________________________________________   MDM / ED COURSE   Morbidly obese 45year old woman s/p gastric bypass presents to the ED with isolated RLQ pain, without evidence of acute pathology, and amenable to outpatient management.  Normal vitals.  Exam reassuring without peritoneal tenderness.  She largely looks well.  Blood work reassuring without evidence of acute derangements.  Minimal hypokalemia is noted.  She eats a full meal tray to help replete this and successfully tolerates p.o. intake.  Urine without infectious features.  CT imaging without  evidence of appendicitis, SBO or further acute derangements.  I see no barriers to outpatient management.  We will discharge with Bentyl and return precautions for the ED.  Clinical Course as of 06/15/21 2108  SNancy FetterJul 31, 2022  1714 Discussed plan of care with the patient and husband, to include blood work, urinalysis and CT imaging considering her surgical history.  She is in agreement. [DS]  1901 Reassessed.  Patient reports somewhat improved symptoms.  We discussed benign work-up so far, need for urinalysis and the possibility of outpatient management.  She is in agreement. [DS]    Clinical Course User Index [DS] SVladimir Crofts MD    ____________________________________________   FINAL CLINICAL IMPRESSION(S) / ED DIAGNOSES  Final diagnoses:  Right lower quadrant abdominal pain     ED Discharge Orders          Ordered    dicyclomine (BENTYL) 10 MG capsule  4 times daily        06/15/21 2043             DVladimir Crofts  Note:  This document was prepared using Dragon voice recognition software and may include unintentional dictation errors.    Vladimir Crofts, MD 06/15/21 2109

## 2021-06-18 ENCOUNTER — Encounter: Payer: Self-pay | Admitting: Internal Medicine

## 2021-06-19 NOTE — Telephone Encounter (Signed)
Please advise, does Patient need an appointment?

## 2021-06-24 ENCOUNTER — Other Ambulatory Visit: Payer: Self-pay

## 2021-06-24 ENCOUNTER — Ambulatory Visit
Admission: RE | Admit: 2021-06-24 | Discharge: 2021-06-24 | Disposition: A | Discharge status: Home / Self Care | Payer: BC Managed Care – PPO | Source: Ambulatory Visit | Attending: Internal Medicine | Admitting: Internal Medicine

## 2021-06-24 DIAGNOSIS — Z1231 Encounter for screening mammogram for malignant neoplasm of breast: Secondary | ICD-10-CM

## 2021-07-07 ENCOUNTER — Encounter (INDEPENDENT_AMBULATORY_CARE_PROVIDER_SITE_OTHER): Payer: Self-pay | Admitting: Family Medicine

## 2021-07-07 ENCOUNTER — Other Ambulatory Visit: Payer: Self-pay

## 2021-07-07 ENCOUNTER — Ambulatory Visit (INDEPENDENT_AMBULATORY_CARE_PROVIDER_SITE_OTHER): Payer: BC Managed Care – PPO | Admitting: Family Medicine

## 2021-07-07 VITALS — BP 121/74 | HR 74 | Temp 98.1°F | Ht 70.0 in | Wt 311.0 lb

## 2021-07-07 DIAGNOSIS — Z9189 Other specified personal risk factors, not elsewhere classified: Secondary | ICD-10-CM

## 2021-07-07 DIAGNOSIS — E8881 Metabolic syndrome: Secondary | ICD-10-CM

## 2021-07-07 DIAGNOSIS — Z6841 Body Mass Index (BMI) 40.0 and over, adult: Secondary | ICD-10-CM

## 2021-07-07 DIAGNOSIS — Z87898 Personal history of other specified conditions: Secondary | ICD-10-CM

## 2021-07-07 DIAGNOSIS — F5081 Binge eating disorder: Secondary | ICD-10-CM

## 2021-07-07 DIAGNOSIS — F3289 Other specified depressive episodes: Secondary | ICD-10-CM | POA: Diagnosis not present

## 2021-07-07 DIAGNOSIS — F50819 Binge eating disorder, unspecified: Secondary | ICD-10-CM

## 2021-07-07 MED ORDER — TIRZEPATIDE 10 MG/0.5ML ~~LOC~~ SOAJ: 10.0000 mg | SUBCUTANEOUS | 0 refills | Status: DC

## 2021-07-07 MED ORDER — AMPHETAMINE-DEXTROAMPHETAMINE 10 MG PO TABS: 10.0000 mg | ORAL_TABLET | Freq: Every day | ORAL | 0 refills | Status: DC

## 2021-07-07 MED ORDER — BUPROPION HCL ER (SR) 150 MG PO TB12: 150.0000 mg | ORAL_TABLET | Freq: Every day | ORAL | 0 refills | Status: DC

## 2021-07-07 MED ORDER — LISDEXAMFETAMINE DIMESYLATE 60 MG PO CAPS: 60.0000 mg | ORAL_CAPSULE | ORAL | 0 refills | Status: DC

## 2021-07-07 NOTE — Progress Notes (Signed)
Chief Complaint:   OBESITY Alyssa Cain is here to discuss her progress with her obesity treatment plan along with follow-up of her obesity related diagnoses. See Medical Weight Management Flowsheet for complete bioelectrical impedance results.  Today's visit was #: 15 Starting weight: 372 lbs Starting date: 07/04/2020 Weight change since last visit: 2 lbs Total lbs lost to date: 61 lbs Total weight loss percentage to date: -16.40%  Nutrition Plan: Keeping a food journal and adhering to recommended goals of 1500 calories and 100 grams of protein daily for 100% of the time. Activity: Swimming for 60 minutes 2 times per week. Anti-obesity medications: Mounjaro 5 mg subcutaneously weekly. Reported side effects: None.  Interim History: Alyssa Cain is on Mounjaro 5 mg weekly with no side effects.  She endorses polyphagia.  She says she actually took 2 tablets of her Vyvanse and felt great - happier, less irritable, and had increased motivation.  Assessment/Plan:   1. Metabolic syndrome Starting goal MET: Lose 7-10% of starting weight. She will continue to focus on protein-rich, low simple carbohydrate foods. We reviewed the importance of hydration, regular exercise for stress reduction, and restorative sleep.  We will continue to check lab work every 3 months, with 10% weight loss, or should any other concerns arise.  - Increase and refill tirzepatide (MOUNJARO) 10 MG/0.5ML Pen; Inject 10 mg into the skin once a week.  Dispense: 6 mL; Refill: 0  2. History of abdominal pain ED visit reviewed.  Now resolved.  Sounds consistent with pulled muscle from swimming.    Plan:  Discontinue Bentyl.  3. Binge eating disorder, with ADHD Alyssa Cain is taking Vyvanse 60 mg daily.  Will refill Vyvanse at current dose and start Adderall 10 mg daily at noon for for help with impulsive eating and decision making.  I have consulted the [place] Controlled Substances Registry for this patient, and feel the risk/benefit  ratio today is favorable for proceeding with this prescription for a controlled substance. The patient understands monitoring parameters and red flags.   - Refill lisdexamfetamine (VYVANSE) 60 MG capsule; Take 1 capsule (60 mg total) by mouth every morning.  Dispense: 30 capsule; Refill: 0 - Start amphetamine-dextroamphetamine (ADDERALL) 10 MG tablet; Take 1 tablet (10 mg total) by mouth daily at 12 noon.  Dispense: 30 tablet; Refill: 0  4. Other depression, with emotional eating Not at goal. Medication: Wellbutrin 150 mg daily.  Plan:  Discussed cues and consequences, how thoughts affect eating, model of thoughts, feelings, and behaviors, and strategies for change by focusing on the cue. Discussed cognitive distortions, coping thoughts, and how to change your thoughts.  - Refill buPROPion (WELLBUTRIN SR) 150 MG 12 hr tablet; Take 1 tablet (150 mg total) by mouth daily.  Dispense: 90 tablet; Refill: 0  5. At risk for malnutrition Alyssa Cain was given extensive malnutrition prevention education and counseling today of more than 8 minutes.  Counseled her that malnutrition refers to inappropriate nutrients or not the right balance of nutrients for optimal health.     6. Obesity, current BMI 44.7  Course: Alyssa Cain is currently in the action stage of change. As such, her goal is to continue with weight loss efforts.   Nutrition goals: She has agreed to keeping a food journal and adhering to recommended goals of 1500 calories and 100 grams of protein.   Exercise goals:  As is.  Behavioral modification strategies: increasing lean protein intake, decreasing simple carbohydrates, increasing vegetables, and increasing water intake.  Alyssa Cain has agreed to  follow-up with our clinic in 4 weeks. She was informed of the importance of frequent follow-up visits to maximize her success with intensive lifestyle modifications for her multiple health conditions.   Objective:   There were no vitals taken for this  visit. There is no height or weight on file to calculate BMI.  General: Cooperative, alert, well developed, in no acute distress. HEENT: Conjunctivae and lids unremarkable. Cardiovascular: Regular rhythm.  Lungs: Normal work of breathing. Neurologic: No focal deficits.   Lab Results  Component Value Date   CREATININE 0.74 06/15/2021   BUN 6 06/15/2021   NA 138 06/15/2021   K 3.3 (L) 06/15/2021   CL 107 06/15/2021   CO2 27 06/15/2021   Lab Results  Component Value Date   ALT 16 06/15/2021   AST 23 06/15/2021   ALKPHOS 123 06/15/2021   BILITOT 0.6 06/15/2021   Lab Results  Component Value Date   HGBA1C 5.3 10/02/2020   HGBA1C 5.5 12/23/2018   Lab Results  Component Value Date   INSULIN 6.6 10/02/2020   INSULIN 11.8 07/04/2020   Lab Results  Component Value Date   TSH 3.090 10/02/2020   Lab Results  Component Value Date   CHOL 175 04/16/2021   HDL 73.80 04/16/2021   LDLCALC 84 04/16/2021   TRIG 84.0 04/16/2021   CHOLHDL 2 04/16/2021   Lab Results  Component Value Date   VD25OH 25.42 (L) 04/16/2021   VD25OH 22.5 (L) 10/02/2020   VD25OH 36.4 08/10/2019   Lab Results  Component Value Date   WBC 4.4 06/15/2021   HGB 13.1 06/15/2021   HCT 40.4 06/15/2021   MCV 89.8 06/15/2021   PLT 321 06/15/2021   Lab Results  Component Value Date   IRON 74 12/11/2020   TIBC 280 12/11/2020   FERRITIN 307 12/11/2020   Attestation Statements:   Reviewed by clinician on day of visit: allergies, medications, problem list, medical history, surgical history, family history, social history, and previous encounter notes.  I, Water quality scientist, CMA, am acting as transcriptionist for Briscoe Deutscher, DO  I have reviewed the above documentation for accuracy and completeness, and I agree with the above. Briscoe Deutscher, DO

## 2021-07-10 ENCOUNTER — Encounter (INDEPENDENT_AMBULATORY_CARE_PROVIDER_SITE_OTHER): Payer: Self-pay

## 2021-08-05 ENCOUNTER — Ambulatory Visit (INDEPENDENT_AMBULATORY_CARE_PROVIDER_SITE_OTHER): Payer: BC Managed Care – PPO | Admitting: Family Medicine

## 2021-08-05 ENCOUNTER — Other Ambulatory Visit: Payer: Self-pay

## 2021-08-05 ENCOUNTER — Encounter (INDEPENDENT_AMBULATORY_CARE_PROVIDER_SITE_OTHER): Payer: Self-pay | Admitting: Family Medicine

## 2021-08-05 VITALS — BP 121/79 | HR 71 | Temp 97.9°F | Ht 70.0 in | Wt 302.0 lb

## 2021-08-05 DIAGNOSIS — R7301 Impaired fasting glucose: Secondary | ICD-10-CM | POA: Diagnosis not present

## 2021-08-05 DIAGNOSIS — Z6841 Body Mass Index (BMI) 40.0 and over, adult: Secondary | ICD-10-CM | POA: Diagnosis not present

## 2021-08-05 DIAGNOSIS — F908 Attention-deficit hyperactivity disorder, other type: Secondary | ICD-10-CM | POA: Diagnosis not present

## 2021-08-05 DIAGNOSIS — Z9189 Other specified personal risk factors, not elsewhere classified: Secondary | ICD-10-CM

## 2021-08-06 MED ORDER — LISDEXAMFETAMINE DIMESYLATE 60 MG PO CAPS: 60.0000 mg | ORAL_CAPSULE | ORAL | 0 refills | Status: DC

## 2021-08-06 MED ORDER — AMPHETAMINE-DEXTROAMPHETAMINE 10 MG PO TABS: 10.0000 mg | ORAL_TABLET | Freq: Every day | ORAL | 0 refills | Status: DC

## 2021-08-06 MED ORDER — TIRZEPATIDE 10 MG/0.5ML ~~LOC~~ SOAJ: 10.0000 mg | SUBCUTANEOUS | 0 refills | Status: DC

## 2021-08-06 NOTE — Progress Notes (Signed)
Chief Complaint:   OBESITY Alyssa Cain is here to discuss her progress with her obesity treatment plan along with follow-up of her obesity related diagnoses.   Today's visit was #: 18 Starting weight: 372 lbs Starting date: 07/04/2020 Today's weight: 302 lbs Today's date: 08/05/2021 Weight change since last visit: 9 lbs Total lbs lost to date: 70 lbs Body mass index is 43.33 kg/m.  Total weight loss percentage to date: -18.82%  Current Meal Plan: keeping a food journal and adhering to recommended goals of 1500 calories and 100 grams of protein for 100% of the time.  Current Exercise Plan: Swimming for 60 minutes 2 times per week. Current Anti-Obesity Medications: Mounjaro 10 mg subcutaneously weekly. Side effects: None.  Interim History:  Alyssa Cain is down 70 pounds today.  Assessment/Plan:   1. Impaired fasting glucose, with polyphagia Controlled. Current treatment: Mounjaro 10 mg subcutaneously weekly. She will continue to focus on protein-rich, low simple carbohydrate foods. We reviewed the importance of hydration, regular exercise for stress reduction, and restorative sleep.  - Refill tirzepatide (MOUNJARO) 10 MG/0.5ML Pen; Inject 10 mg into the skin once a week.  Dispense: 6 mL; Refill: 0  2. Attention deficit hyperactivity disorder (ADHD), other type, with BED Alyssa Cain is taking Vyvanse 60 mg daily and Adderall 10 mg daily for BED and ADHD.  Plan:  Will refill Vyvanse and Adderall today.   - Refill lisdexamfetamine (VYVANSE) 60 MG capsule; Take 1 capsule (60 mg total) by mouth every morning.  Dispense: 30 capsule; Refill: 0 - Refill amphetamine-dextroamphetamine (ADDERALL) 10 MG tablet; Take 1 tablet (10 mg total) by mouth daily at 12 noon.  Dispense: 30 tablet; Refill: 0  I have consulted the [place] Controlled Substances Registry for this patient, and feel the risk/benefit ratio today is favorable for proceeding with this prescription for a controlled substance. The patient  understands monitoring parameters and red flags.   3. At risk for heart disease Due to Alyssa Cain's current state of health and medical condition(s), she is at a higher risk for heart disease.  This puts the patient at much greater risk to subsequently develop cardiopulmonary conditions that can significantly affect patient's quality of life in a negative manner.    At least 8 minutes were spent on counseling Alyssa Cain about these concerns today. Evidence-based interventions for health behavior change were utilized today including the discussion of self monitoring techniques, problem-solving barriers, and SMART goal setting techniques.  Specifically, regarding patient's less desirable eating habits and patterns, we employed the technique of small changes when Alyssa Cain has not been able to fully commit to her prudent nutritional plan.  4. Obesity, current BMI 43.4  Course: Alyssa Cain is currently in the action stage of change. As such, her goal is to continue with weight loss efforts.   Nutrition goals: She has agreed to keeping a food journal and adhering to recommended goals of 1500 calories and 100 grams of protein.   Exercise goals:  As is.  Behavioral modification strategies: increasing lean protein intake, decreasing simple carbohydrates, increasing vegetables, and increasing water intake.  Alyssa Cain has agreed to follow-up with our clinic in 4 weeks. She was informed of the importance of frequent follow-up visits to maximize her success with intensive lifestyle modifications for her multiple health conditions.   Objective:   Blood pressure 121/79, pulse 71, temperature 97.9 F (36.6 C), temperature source Oral, height 5\' 10"  (1.778 m), weight (!) 302 lb (137 kg), SpO2 99 %. Body mass index is 43.33 kg/m.  General: Cooperative, alert, well developed, in no acute distress. HEENT: Conjunctivae and lids unremarkable. Cardiovascular: Regular rhythm.  Lungs: Normal work of breathing. Neurologic: No  focal deficits.   Lab Results  Component Value Date   CREATININE 0.74 06/15/2021   BUN 6 06/15/2021   NA 138 06/15/2021   K 3.3 (L) 06/15/2021   CL 107 06/15/2021   CO2 27 06/15/2021   Lab Results  Component Value Date   ALT 16 06/15/2021   AST 23 06/15/2021   ALKPHOS 123 06/15/2021   BILITOT 0.6 06/15/2021   Lab Results  Component Value Date   HGBA1C 5.3 10/02/2020   HGBA1C 5.5 12/23/2018   Lab Results  Component Value Date   INSULIN 6.6 10/02/2020   INSULIN 11.8 07/04/2020   Lab Results  Component Value Date   TSH 3.090 10/02/2020   Lab Results  Component Value Date   CHOL 175 04/16/2021   HDL 73.80 04/16/2021   LDLCALC 84 04/16/2021   TRIG 84.0 04/16/2021   CHOLHDL 2 04/16/2021   Lab Results  Component Value Date   VD25OH 25.42 (L) 04/16/2021   VD25OH 22.5 (L) 10/02/2020   VD25OH 36.4 08/10/2019   Lab Results  Component Value Date   WBC 4.4 06/15/2021   HGB 13.1 06/15/2021   HCT 40.4 06/15/2021   MCV 89.8 06/15/2021   PLT 321 06/15/2021   Lab Results  Component Value Date   IRON 74 12/11/2020   TIBC 280 12/11/2020   FERRITIN 307 12/11/2020   Attestation Statements:   Reviewed by clinician on day of visit: allergies, medications, problem list, medical history, surgical history, family history, social history, and previous encounter notes.  I, Water quality scientist, CMA, am acting as transcriptionist for Briscoe Deutscher, DO  I have reviewed the above documentation for accuracy and completeness, and I agree with the above. Briscoe Deutscher, DO

## 2021-08-28 ENCOUNTER — Other Ambulatory Visit (INDEPENDENT_AMBULATORY_CARE_PROVIDER_SITE_OTHER): Payer: Self-pay | Admitting: Family Medicine

## 2021-08-28 DIAGNOSIS — R7301 Impaired fasting glucose: Secondary | ICD-10-CM

## 2021-08-28 MED ORDER — TIRZEPATIDE 10 MG/0.5ML ~~LOC~~ SOAJ
10.0000 mg | SUBCUTANEOUS | 0 refills | Status: DC
Start: 2021-08-28 — End: 2021-09-09

## 2021-08-28 NOTE — Telephone Encounter (Signed)
Last seen by Dr. Wallace. 

## 2021-09-09 ENCOUNTER — Ambulatory Visit (INDEPENDENT_AMBULATORY_CARE_PROVIDER_SITE_OTHER): Payer: BC Managed Care – PPO | Admitting: Family Medicine

## 2021-09-09 ENCOUNTER — Other Ambulatory Visit: Payer: Self-pay

## 2021-09-09 ENCOUNTER — Encounter: Payer: Self-pay | Admitting: Internal Medicine

## 2021-09-09 ENCOUNTER — Encounter (INDEPENDENT_AMBULATORY_CARE_PROVIDER_SITE_OTHER): Payer: Self-pay | Admitting: Family Medicine

## 2021-09-09 VITALS — BP 134/83 | HR 83 | Temp 97.4°F | Ht 70.0 in | Wt 296.0 lb

## 2021-09-09 DIAGNOSIS — F908 Attention-deficit hyperactivity disorder, other type: Secondary | ICD-10-CM | POA: Diagnosis not present

## 2021-09-09 DIAGNOSIS — E538 Deficiency of other specified B group vitamins: Secondary | ICD-10-CM | POA: Diagnosis not present

## 2021-09-09 DIAGNOSIS — Z6841 Body Mass Index (BMI) 40.0 and over, adult: Secondary | ICD-10-CM

## 2021-09-09 DIAGNOSIS — R7301 Impaired fasting glucose: Secondary | ICD-10-CM

## 2021-09-09 MED ORDER — CYANOCOBALAMIN 1000 MCG/ML IJ SOLN
1000.0000 ug | INTRAMUSCULAR | 0 refills | Status: DC
Start: 2021-09-09 — End: 2022-04-16
  Filled 2021-09-09: qty 1, 30d supply, fill #0

## 2021-09-09 MED ORDER — LISDEXAMFETAMINE DIMESYLATE 60 MG PO CAPS
60.0000 mg | ORAL_CAPSULE | ORAL | 0 refills | Status: DC
Start: 2021-09-09 — End: 2021-10-07
  Filled 2021-09-09: qty 30, 30d supply, fill #0

## 2021-09-09 MED ORDER — TIRZEPATIDE 10 MG/0.5ML ~~LOC~~ SOAJ
10.0000 mg | SUBCUTANEOUS | 0 refills | Status: DC
  Filled 2021-09-09 – 2021-09-23 (×3): qty 6, 84d supply, fill #0
  Filled 2021-09-23: qty 2, 28d supply, fill #0

## 2021-09-09 MED ORDER — "SYRINGE/NEEDLE (DISP) 25G X 1"" 3 ML MISC"
0 refills | Status: DC
  Filled 2021-09-09: qty 50, fill #0

## 2021-09-11 NOTE — Progress Notes (Signed)
Chief Complaint:   OBESITY Alyssa Cain is here to discuss her progress with her obesity treatment plan along with follow-up of her obesity related diagnoses. See Medical Weight Management Flowsheet for complete bioelectrical impedance results.  Today's visit was #: 69 Starting weight: 372 lbs Starting date: 07/04/2020 Weight change since last visit: 6 lbs Total lbs lost to date: 76 lbs Total weight loss percentage to date: -20.43%  Nutrition Plan: Keeping a food journal and adhering to recommended goals of 1500 calories and 100 grams of protein daily for 100% of the time. Activity: Swimming for 60 minutes 2 times per week. Anti-obesity medications: Mounjaro 10 mg subcutaneously weekly. Reported side effects: None.  Interim History: Alyssa Cain says she has had Mounjaro prescription issues at the pharmacy.  She has not had any medications for 3 weeks.  She reports that she never got the needles for her B12 injections.  Assessment/Plan:   1. Impaired fasting glucose, with polyphagia Controlled. Current treatment: Mounjaro 10 mg subcutaneously weekly.    Plan: She will continue to focus on protein-rich, low simple carbohydrate foods. We reviewed the importance of hydration, regular exercise for stress reduction, and restorative sleep.  - Refill tirzepatide (MOUNJARO) 10 MG/0.5ML Pen; Inject 10 mg into the skin once a week.  Dispense: 6 mL; Refill: 0  2. B12 deficiency Lab Results  Component Value Date   VITAMINB12 161 (L) 12/11/2020   Supplementation: Vitamin B12 injections.   Plan:  Continue current treatment.  Will resend needles.   - SYRINGE-NEEDLE, DISP, 3 ML 25G X 1" 3 ML MISC; Use to give b12 injections  Dispense: 50 each; Refill: 0 - Refill cyanocobalamin (,VITAMIN B-12,) 1000 MCG/ML injection; Inject 1 mL (1,000 mcg total) into the muscle every 30 (thirty) days.  Dispense: 10 mL; Refill: 0  3. Attention deficit hyperactivity disorder (ADHD), other type, with BED Not at goal.  Medication:  Vyvanse 60 mg daily.  Counseling ADHD is the most missed diagnosis in relation to food and appetite problems. Often the strong urge to binge or to self-medicate with food subsides once the impulsivity and inattention of ADHD are treated. A person can experience a new ability to tune in to the body's signals, control cravings and improve impulse control.  A deficiency in norepinephrine and dopamine can lead to the following behaviors related to eating:  Poor awareness of internal cues of hunger and satiety, or fullness. Inability to follow a meal plan. Inability to judge portion size accurately. Inability to stop bingeing or purging. Distraction by continual thoughts of food, weight and body shape. Increased desire to overeat, especially high calorie, "reward" type foods. Poor self-esteem due to repeated failures of self-control.  Plan: Refill Vyvanse 60 mg daily.  - Refill lisdexamfetamine (VYVANSE) 60 MG capsule; Take 1 capsule (60 mg total) by mouth every morning.  Dispense: 30 capsule; Refill: 0  I have consulted the [place] Controlled Substances Registry for this patient, and feel the risk/benefit ratio today is favorable for proceeding with this prescription for a controlled substance. The patient understands monitoring parameters and red flags.   4. Obesity, current BMI 42.6  Course: Alyssa Cain is currently in the action stage of change. As such, her goal is to continue with weight loss efforts.   Nutrition goals: She has agreed to keeping a food journal and adhering to recommended goals of 1500 calories and 100 grams of protein.   Exercise goals:  As is.  Behavioral modification strategies: increasing lean protein intake, decreasing simple carbohydrates,  increasing vegetables, and increasing water intake.  Alyssa Cain has agreed to follow-up with our clinic in 4 weeks. She was informed of the importance of frequent follow-up visits to maximize her success with intensive lifestyle  modifications for her multiple health conditions.   Objective:   Blood pressure 134/83, pulse 83, temperature (!) 97.4 F (36.3 C), temperature source Oral, height 5\' 10"  (1.778 m), weight 296 lb (134.3 kg), SpO2 98 %. Body mass index is 42.47 kg/m.  General: Cooperative, alert, well developed, in no acute distress. HEENT: Conjunctivae and lids unremarkable. Cardiovascular: Regular rhythm.  Lungs: Normal work of breathing. Neurologic: No focal deficits.   Lab Results  Component Value Date   CREATININE 0.74 06/15/2021   BUN 6 06/15/2021   NA 138 06/15/2021   K 3.3 (L) 06/15/2021   CL 107 06/15/2021   CO2 27 06/15/2021   Lab Results  Component Value Date   ALT 16 06/15/2021   AST 23 06/15/2021   ALKPHOS 123 06/15/2021   BILITOT 0.6 06/15/2021   Lab Results  Component Value Date   HGBA1C 5.3 10/02/2020   HGBA1C 5.5 12/23/2018   Lab Results  Component Value Date   INSULIN 6.6 10/02/2020   INSULIN 11.8 07/04/2020   Lab Results  Component Value Date   TSH 3.090 10/02/2020   Lab Results  Component Value Date   CHOL 175 04/16/2021   HDL 73.80 04/16/2021   LDLCALC 84 04/16/2021   TRIG 84.0 04/16/2021   CHOLHDL 2 04/16/2021   Lab Results  Component Value Date   VD25OH 25.42 (L) 04/16/2021   VD25OH 22.5 (L) 10/02/2020   VD25OH 36.4 08/10/2019   Lab Results  Component Value Date   WBC 4.4 06/15/2021   HGB 13.1 06/15/2021   HCT 40.4 06/15/2021   MCV 89.8 06/15/2021   PLT 321 06/15/2021   Lab Results  Component Value Date   IRON 74 12/11/2020   TIBC 280 12/11/2020   FERRITIN 307 12/11/2020   Attestation Statements:   Reviewed by clinician on day of visit: allergies, medications, problem list, medical history, surgical history, family history, social history, and previous encounter notes.  I, Water quality scientist, CMA, am acting as transcriptionist for Briscoe Deutscher, DO  I have reviewed the above documentation for accuracy and completeness, and I agree with the  above. -  Briscoe Deutscher, DO, MS, FAAFP, DABOM - Family and Bariatric Medicine.

## 2021-09-12 ENCOUNTER — Other Ambulatory Visit: Payer: Self-pay

## 2021-09-12 ENCOUNTER — Encounter: Payer: Self-pay | Admitting: Internal Medicine

## 2021-09-14 ENCOUNTER — Encounter (INDEPENDENT_AMBULATORY_CARE_PROVIDER_SITE_OTHER): Payer: Self-pay | Admitting: Family Medicine

## 2021-09-14 DIAGNOSIS — R7301 Impaired fasting glucose: Secondary | ICD-10-CM

## 2021-09-15 NOTE — Telephone Encounter (Signed)
Last seen Dr. Juleen China

## 2021-09-23 ENCOUNTER — Other Ambulatory Visit: Payer: Self-pay

## 2021-09-23 ENCOUNTER — Encounter: Payer: Self-pay | Admitting: Internal Medicine

## 2021-09-24 DIAGNOSIS — M1711 Unilateral primary osteoarthritis, right knee: Secondary | ICD-10-CM | POA: Diagnosis not present

## 2021-09-24 DIAGNOSIS — M25561 Pain in right knee: Secondary | ICD-10-CM | POA: Diagnosis not present

## 2021-09-24 DIAGNOSIS — M7121 Synovial cyst of popliteal space [Baker], right knee: Secondary | ICD-10-CM | POA: Diagnosis not present

## 2021-09-24 DIAGNOSIS — M25461 Effusion, right knee: Secondary | ICD-10-CM | POA: Diagnosis not present

## 2021-09-24 MED ORDER — OZEMPIC (1 MG/DOSE) 4 MG/3ML ~~LOC~~ SOPN: 1.0000 mg | PEN_INJECTOR | SUBCUTANEOUS | 0 refills | Status: DC

## 2021-09-30 ENCOUNTER — Other Ambulatory Visit: Payer: Self-pay

## 2021-09-30 DIAGNOSIS — E559 Vitamin D deficiency, unspecified: Secondary | ICD-10-CM

## 2021-09-30 DIAGNOSIS — Z1389 Encounter for screening for other disorder: Secondary | ICD-10-CM

## 2021-09-30 DIAGNOSIS — E8809 Other disorders of plasma-protein metabolism, not elsewhere classified: Secondary | ICD-10-CM

## 2021-09-30 DIAGNOSIS — I10 Essential (primary) hypertension: Secondary | ICD-10-CM

## 2021-10-07 ENCOUNTER — Encounter (INDEPENDENT_AMBULATORY_CARE_PROVIDER_SITE_OTHER): Payer: Self-pay | Admitting: Family Medicine

## 2021-10-07 ENCOUNTER — Ambulatory Visit (INDEPENDENT_AMBULATORY_CARE_PROVIDER_SITE_OTHER): Payer: BC Managed Care – PPO | Admitting: Family Medicine

## 2021-10-07 ENCOUNTER — Other Ambulatory Visit: Payer: Self-pay

## 2021-10-07 VITALS — BP 130/85 | HR 81 | Temp 97.5°F | Ht 70.0 in | Wt 290.0 lb

## 2021-10-07 DIAGNOSIS — I10 Essential (primary) hypertension: Secondary | ICD-10-CM | POA: Diagnosis not present

## 2021-10-07 DIAGNOSIS — R7301 Impaired fasting glucose: Secondary | ICD-10-CM

## 2021-10-07 DIAGNOSIS — F908 Attention-deficit hyperactivity disorder, other type: Secondary | ICD-10-CM

## 2021-10-07 DIAGNOSIS — E538 Deficiency of other specified B group vitamins: Secondary | ICD-10-CM

## 2021-10-07 DIAGNOSIS — Z6841 Body Mass Index (BMI) 40.0 and over, adult: Secondary | ICD-10-CM

## 2021-10-07 MED ORDER — OZEMPIC (1 MG/DOSE) 4 MG/3ML ~~LOC~~ SOPN: 1.0000 mg | PEN_INJECTOR | SUBCUTANEOUS | 2 refills | Status: DC

## 2021-10-07 MED ORDER — "SYRINGE/NEEDLE (DISP) 25G X 1"" 3 ML MISC": 0 refills | Status: DC

## 2021-10-07 MED ORDER — LISDEXAMFETAMINE DIMESYLATE 60 MG PO CAPS: 60.0000 mg | ORAL_CAPSULE | ORAL | 0 refills | Status: DC

## 2021-10-07 MED ORDER — SPIRONOLACTONE 25 MG PO TABS: 25.0000 mg | ORAL_TABLET | Freq: Every day | ORAL | 0 refills | Status: DC

## 2021-10-07 NOTE — Progress Notes (Signed)
Chief Complaint:   OBESITY Alyssa Cain is here to discuss her progress with her obesity treatment plan along with follow-up of her obesity related diagnoses. See Medical Weight Management Flowsheet for complete bioelectrical impedance results.  Today's visit was #: 40 Starting weight: 372 lbs Starting date: 07/04/2020 Weight change since last visit: 6 lbs Total lbs lost to date: 26 lbs Total weight loss percentage to date: -22.04%  Nutrition Plan: Keeping a food journal and adhering to recommended goals of 1500 calories and 100 grams of protein daily for 100% of the time. Activity: Swimming for 60 minutes 2 times per week.  Anti-obesity medications: Ozempic 1 mg subcutaneously weekly. Reported side effects: None.  Interim History: Alyssa Cain is down 82 pounds!  She is happy with her progress.  Assessment/Plan:   1. Impaired fasting glucose Alyssa Cain is taking Ozempic 1 mg subcutaneously weekly.  Plan:  Increase Ozempic to 2 mg subcutaneously or 1 mg subcutaneously twice weekly, as per below.  - Increase Semaglutide, 1 MG/DOSE, (OZEMPIC, 1 MG/DOSE,) 4 MG/3ML SOPN; Inject 1 mg into the skin 2 (two) times a week.  Dispense: 6 mL; Refill: 2  2. B12 deficiency  She has still not been able to obtain syringes/needles from pharmacy.   Lab Results  Component Value Date   VITAMINB12 161 (L) 12/11/2020   Supplementation: Vitamin B12 1,000 mcg/mL once monthly.   Plan:  Continue current treatment.  Will refill syringes and needles today.  - Refill SYRINGE-NEEDLE, DISP, 3 ML 25G X 1" 3 ML MISC; Use to give b12 injections  Dispense: 50 each; Refill: 0  3. Essential hypertension At goal. Medications: amlodipine 5 mg daily.   Plan:  Start spironolactone 25 mg daily and cut amlodipine dose in half.  Check blood pressure daily.    BP Readings from Last 3 Encounters:  10/07/21 130/85  09/09/21 134/83  08/05/21 121/79   Lab Results  Component Value Date   CREATININE 0.74 06/15/2021   -  Start spironolactone (ALDACTONE) 25 MG tablet; Take 1 tablet (25 mg total) by mouth daily.  Dispense: 30 tablet; Refill: 0  4. Attention deficit hyperactivity disorder (ADHD), other type, with BED Medication:Vyvanse 60 mg daily.  Counseling ADHD is the most missed diagnosis in relation to food and appetite problems. Often the strong urge to binge or to self-medicate with food subsides once the impulsivity and inattention of ADHD are treated. A person can experience a new ability to tune in to the body's signals, control cravings and improve impulse control.  A deficiency in norepinephrine and dopamine can lead to the following behaviors related to eating:  Poor awareness of internal cues of hunger and satiety, or fullness. Inability to follow a meal plan. Inability to judge portion size accurately. Inability to stop bingeing or purging. Distraction by continual thoughts of food, weight and body shape. Increased desire to overeat, especially high calorie, "reward" type foods. Poor self-esteem due to repeated failures of self-control.  Plan: Continue Vyvanse 60 mg.  Will refill today, as per below.  - Refill lisdexamfetamine (VYVANSE) 60 MG capsule; Take 1 capsule (60 mg total) by mouth every morning.  Dispense: 30 capsule; Refill: 0  I have consulted the [place] Controlled Substances Registry for this patient, and feel the risk/benefit ratio today is favorable for proceeding with this prescription for a controlled substance. The patient understands monitoring parameters and red flags.   5. Obesity, current BMI 41.7  Course: Alyssa Cain is currently in the action stage of change. As such,  her goal is to continue with weight loss efforts.   Nutrition goals: She has agreed to keeping a food journal and adhering to recommended goals of 1500 calories and 100 grams of protein.   Exercise goals:  As is.  Behavioral modification strategies: increasing lean protein intake, decreasing simple  carbohydrates, increasing vegetables, and increasing water intake.  Alyssa Cain has agreed to follow-up with our clinic in 4 weeks. She was informed of the importance of frequent follow-up visits to maximize her success with intensive lifestyle modifications for her multiple health conditions.   Objective:   Blood pressure 130/85, pulse 81, temperature (!) 97.5 F (36.4 C), temperature source Oral, height 5\' 10"  (1.778 m), weight 290 lb (131.5 kg), SpO2 98 %. Body mass index is 41.61 kg/m.  General: Cooperative, alert, well developed, in no acute distress. HEENT: Conjunctivae and lids unremarkable. Cardiovascular: Regular rhythm.  Lungs: Normal work of breathing. Neurologic: No focal deficits.   Lab Results  Component Value Date   CREATININE 0.74 06/15/2021   BUN 6 06/15/2021   NA 138 06/15/2021   K 3.3 (L) 06/15/2021   CL 107 06/15/2021   CO2 27 06/15/2021   Lab Results  Component Value Date   ALT 16 06/15/2021   AST 23 06/15/2021   ALKPHOS 123 06/15/2021   BILITOT 0.6 06/15/2021   Lab Results  Component Value Date   HGBA1C 5.3 10/02/2020   HGBA1C 5.5 12/23/2018   Lab Results  Component Value Date   INSULIN 6.6 10/02/2020   INSULIN 11.8 07/04/2020   Lab Results  Component Value Date   TSH 3.090 10/02/2020   Lab Results  Component Value Date   CHOL 175 04/16/2021   HDL 73.80 04/16/2021   LDLCALC 84 04/16/2021   TRIG 84.0 04/16/2021   CHOLHDL 2 04/16/2021   Lab Results  Component Value Date   VD25OH 25.42 (L) 04/16/2021   VD25OH 22.5 (L) 10/02/2020   VD25OH 36.4 08/10/2019   Lab Results  Component Value Date   WBC 4.4 06/15/2021   HGB 13.1 06/15/2021   HCT 40.4 06/15/2021   MCV 89.8 06/15/2021   PLT 321 06/15/2021   Lab Results  Component Value Date   IRON 74 12/11/2020   TIBC 280 12/11/2020   FERRITIN 307 12/11/2020   Attestation Statements:   Reviewed by clinician on day of visit: allergies, medications, problem list, medical history, surgical  history, family history, social history, and previous encounter notes.  I, Water quality scientist, CMA, am acting as transcriptionist for Briscoe Deutscher, DO  I have reviewed the above documentation for accuracy and completeness, and I agree with the above. -  Briscoe Deutscher, DO, MS, FAAFP, DABOM - Family and Bariatric Medicine.

## 2021-10-16 ENCOUNTER — Encounter: Payer: Self-pay | Admitting: Internal Medicine

## 2021-10-16 ENCOUNTER — Ambulatory Visit (INDEPENDENT_AMBULATORY_CARE_PROVIDER_SITE_OTHER): Payer: BC Managed Care – PPO | Admitting: Internal Medicine

## 2021-10-16 ENCOUNTER — Other Ambulatory Visit: Payer: Self-pay

## 2021-10-16 VITALS — BP 124/82 | HR 77 | Temp 97.0°F | Ht 70.0 in | Wt 295.8 lb

## 2021-10-16 DIAGNOSIS — E559 Vitamin D deficiency, unspecified: Secondary | ICD-10-CM

## 2021-10-16 DIAGNOSIS — I10 Essential (primary) hypertension: Secondary | ICD-10-CM

## 2021-10-16 DIAGNOSIS — E876 Hypokalemia: Secondary | ICD-10-CM

## 2021-10-16 DIAGNOSIS — R600 Localized edema: Secondary | ICD-10-CM

## 2021-10-16 DIAGNOSIS — Z1329 Encounter for screening for other suspected endocrine disorder: Secondary | ICD-10-CM | POA: Diagnosis not present

## 2021-10-16 DIAGNOSIS — E538 Deficiency of other specified B group vitamins: Secondary | ICD-10-CM | POA: Diagnosis not present

## 2021-10-16 DIAGNOSIS — E611 Iron deficiency: Secondary | ICD-10-CM

## 2021-10-16 DIAGNOSIS — G4700 Insomnia, unspecified: Secondary | ICD-10-CM

## 2021-10-16 DIAGNOSIS — Z23 Encounter for immunization: Secondary | ICD-10-CM

## 2021-10-16 LAB — IBC + FERRITIN
Ferritin: 281.8 ng/mL (ref 10.0–291.0)
Iron: 80 ug/dL (ref 42–145)
Saturation Ratios: 28.3 % (ref 20.0–50.0)
TIBC: 282.8 ug/dL (ref 250.0–450.0)
Transferrin: 202 mg/dL — ABNORMAL LOW (ref 212.0–360.0)

## 2021-10-16 LAB — COMPREHENSIVE METABOLIC PANEL
ALT: 14 U/L (ref 0–35)
AST: 15 U/L (ref 0–37)
Albumin: 3.9 g/dL (ref 3.5–5.2)
Alkaline Phosphatase: 125 U/L — ABNORMAL HIGH (ref 39–117)
BUN: 7 mg/dL (ref 6–23)
CO2: 26 mEq/L (ref 19–32)
Calcium: 8.3 mg/dL — ABNORMAL LOW (ref 8.4–10.5)
Chloride: 104 mEq/L (ref 96–112)
Creatinine, Ser: 0.77 mg/dL (ref 0.40–1.20)
GFR: 94.31 mL/min (ref >60.00)
Glucose, Bld: 74 mg/dL (ref 70–99)
Potassium: 3.5 mEq/L (ref 3.5–5.1)
Sodium: 138 mEq/L (ref 135–145)
Total Bilirubin: 0.5 mg/dL (ref 0.2–1.2)
Total Protein: 6.3 g/dL (ref 6.0–8.3)

## 2021-10-16 LAB — VITAMIN D 25 HYDROXY (VIT D DEFICIENCY, FRACTURES): VITD: 18.69 ng/mL — ABNORMAL LOW (ref 30.00–100.00)

## 2021-10-16 LAB — TSH: TSH: 0.84 u[IU]/mL (ref 0.35–5.50)

## 2021-10-16 MED ORDER — CYANOCOBALAMIN 1000 MCG/ML IJ SOLN
1000.0000 ug | Freq: Once | INTRAMUSCULAR | Status: AC
Start: 2021-10-16 — End: 2021-10-16
  Administered 2021-10-16: 1000 ug via INTRAMUSCULAR

## 2021-10-16 MED ORDER — ZOLPIDEM TARTRATE ER 12.5 MG PO TBCR: 12.5000 mg | EXTENDED_RELEASE_TABLET | Freq: Every evening | ORAL | 5 refills | Status: DC | PRN

## 2021-10-16 MED ORDER — SPIRONOLACTONE 25 MG PO TABS
25.0000 mg | ORAL_TABLET | Freq: Every day | ORAL | 3 refills | Status: DC
Start: 2021-10-16 — End: 2021-11-24

## 2021-10-16 MED ORDER — "SYRINGE/NEEDLE (DISP) 25G X 1"" 3 ML MISC": 0 refills | Status: DC

## 2021-10-16 NOTE — Patient Instructions (Addendum)
If ankles/feet/legs swell again call me back please cut norvasc/amlodipine back to 2.5 mg qd  Iron Deficiency Anemia, Adult Iron deficiency anemia is when you do not have enough red blood cells or hemoglobin in your blood. This happens because you have too little iron in your body. Hemoglobin carries oxygen to parts of the body. Anemia can cause your body to not get enough oxygen. What are the causes? Not eating enough foods that have iron in them. The body not being able to take in iron well. Needing more iron due to pregnancy or heavy menstrual periods, for females. Cancer. Bleeding in the bowels. Many blood draws. What increases the risk? Being pregnant. Being a teenage girl going through a growth spurt. What are the signs or symptoms? Pale skin, lips, and nails. Weakness, dizziness, and getting tired easily. Headache. Feeling like you cannot breathe well when moving (shortness of breath). Cold hands and feet. Fast heartbeat or a heartbeat that is not regular. Feeling grouchy (irritable) or breathing fast. These are more common in very bad anemia. Mild anemia may not cause any symptoms. How is this treated? This condition is treated by finding out why you do not have enough iron and then getting more iron. It may include: Adding foods to your diet that have a lot of iron. Taking iron pills (supplements). If you are pregnant or breastfeeding, you may need to take extra iron. Your diet often does not provide the amount of iron that you need. Getting more vitamin C in your diet. Vitamin C helps your body take in iron. You may need to take iron pills with a glass of orange juice or vitamin C pills. Medicines to make heavy menstrual periods lighter. Surgery. You may need blood tests to see if treatment is working. If the treatment does not seem to be working, you may need more tests. Follow these instructions at home: Medicines Take over-the-counter and prescription medicines only as  told by your doctor. This includes iron pills and vitamins. Take iron pills when your stomach is empty. If you cannot handle this, take them with food. Do not drink milk or take antacids at the same time as your iron pills. Iron pills may turn your poop (stool)black. If you cannot handle taking iron pills by mouth, ask your doctor about getting iron through: An IV tube. A shot (injection) into a muscle. Eating and drinking  Talk with your doctor before changing the foods you eat. He or she may tell you to eat foods that have a lot of iron, such as: Liver. Low-fat (lean) beef. Breads and cereals that have iron added to them. Eggs. Dried fruit. Dark green, leafy vegetables. Eat fresh fruits and vegetables that are high in vitamin C. They help your body use iron. Foods with a lot of vitamin C include: Oranges. Peppers. Tomatoes. Mangoes. Drink enough fluid to keep your pee (urine) pale yellow. Managing constipation If you are taking iron pills, they may cause trouble pooping (constipation). To prevent or treat trouble pooping, you may need to: Take over-the-counter or prescription medicines. Eat foods that are high in fiber. These include beans, whole grains, and fresh fruits and vegetables. Limit foods that are high in fat and sugar. These include fried or sweet foods. General instructions Return to your normal activities as told by your doctor. Ask your doctor what activities are safe for you. Keep yourself clean, and keep things clean around you. Keep all follow-up visits as told by your doctor. This is  important. Contact a doctor if: You feel like you may vomit (nauseous), or you vomit. You feel weak. You are sweating for no reason. You have trouble pooping, such as: Pooping less than 3 times a week. Straining to poop. Having poop that is hard, dry, or larger than normal. Feeling full or bloated. Pain in the lower belly. Not feeling better after pooping. Get help right away  if: You pass out (faint). You have chest pain. You have trouble breathing that: Is very bad. Gets worse with physical activity. You have a fast heartbeat, or a heartbeat that does not feel regular. You get light-headed when getting up from sitting or lying down. These symptoms may be an emergency. Do not wait to see if the symptoms will go away. Get medical help right away. Call your local emergency services (911 in the [place] Do not drive yourself to the hospital. Summary Iron deficiency anemia is when you have too little iron in your body. This condition is treated by finding out why you do not have enough iron in your body and then getting more iron. Take over-the-counter and prescription medicines only as told by your doctor. Eat fresh fruits and vegetables that are high in vitamin C. Get help right away if you cannot breathe well. This information is not intended to replace advice given to you by your health care provider. Make sure you discuss any questions you have with your health care provider. Document Revised: 07/11/2019 Document Reviewed: 07/11/2019 Elsevier Patient Education  [place].

## 2021-10-16 NOTE — Progress Notes (Signed)
Chief Complaint  Patient presents with   Follow-up   F/u  1. B12 shot today for def  2. Htn controlled 10/07/21 given spironlactone 25 mg due to leg swelling which is better and was on norvasc 5 mg qd  3. Lost 85 lbs was on moujaro 10 but now ozempic 1 since not dm insurance would not cover moujaro  F/u Dr. Juleen China [facility]  4. C/o ridges in nails not taking iron with h/o iron def   Review of Systems  Constitutional:  Negative for weight loss.  HENT:  Negative for hearing loss.   Eyes:  Negative for blurred vision.  Respiratory:  Negative for shortness of breath.   Cardiovascular:  Negative for chest pain.  Gastrointestinal:  Negative for abdominal pain and blood in stool.  Genitourinary:  Negative for dysuria.  Musculoskeletal:  Negative for falls and joint pain.  Skin:  Negative for rash.  Neurological:  Negative for headaches.  Psychiatric/Behavioral:  Negative for depression.   Past Medical History:  Diagnosis Date   Anemia    Anxiety    Depression    Fibroid    History of blood clots    Hypertension    Iron deficiency anemia    Right leg DVT (HCC)    Past Surgical History:  Procedure Laterality Date   GASTRIC BYPASS     2007   LAPAROSCOPIC TOTAL HYSTERECTOMY     removed cervix/uterus, removal of b/l tubes and ovary(s) fibroid 09/04/19 [facility] Dr. Delle Reining, Iona Coach MD; no need for future paps as of 11/22/19    Family History  Problem Relation Age of Onset   Hypertension Mother    Cancer Mother        brain   Hyperlipidemia Mother    Obesity Mother    Hyperlipidemia Father    Diabetes Maternal Grandmother    Social History   Socioeconomic History   Marital status: Married    Spouse name: Not on file   Number of children: Not on file   Years of education: Not on file   Highest education level: Not on file  Occupational History   Not on file  Tobacco Use   Smoking status: Never   Smokeless tobacco: Never  Substance and Sexual Activity    Alcohol use: Not Currently   Drug use: Not Currently   Sexual activity: Yes    Comment: men  Other Topics Concern   Not on file  Social History Narrative   Married Cytogeneticist    2 kids 69 and 21 y.o as of 12/20/2018    Moved from [place] to [place]    Owns guns, wears seat belt, safe in relationship    3 pregnancies 2 live births       Work at [employer] in [employer].    Social Determinants of Health   Financial Resource Strain: Not on file  Food Insecurity: Not on file  Transportation Needs: Not on file  Physical Activity: Not on file  Stress: Not on file  Social Connections: Not on file  Intimate Partner Violence: Not on file   Current Meds  Medication Sig   amLODipine (NORVASC) 5 MG tablet Take 1 tablet (5 mg total) by mouth daily. In am   buPROPion (WELLBUTRIN SR) 150 MG 12 hr tablet Take 1 tablet (150 mg total) by mouth daily.   DICLOFENAC SODIUM PO Take 60 mg by mouth 2 (two) times daily.    Insulin Pen Needle 32G X  4 MM MISC 1 each by Does not apply route once a week.   lisdexamfetamine (VYVANSE) 60 MG capsule Take 1 capsule (60 mg total) by mouth every morning.   NEEDLE, DISP, 25 G 25G X 1-1/2" MISC 1 Device by Does not apply route once a week.   Semaglutide, 1 MG/DOSE, (OZEMPIC, 1 MG/DOSE,) 4 MG/3ML SOPN Inject 1 mg into the skin 2 (two) times a week.   [DISCONTINUED] spironolactone (ALDACTONE) 25 MG tablet Take 1 tablet (25 mg total) by mouth daily.   [DISCONTINUED] SYRINGE-NEEDLE, DISP, 3 ML 25G X 1" 3 ML MISC Use to give b12 injections   [DISCONTINUED] zolpidem (AMBIEN CR) 12.5 MG CR tablet Take 1 tablet (12.5 mg total) by mouth at bedtime as needed for sleep. ambien 5 mg qhs is not helping   Current Facility-Administered Medications for the 10/16/21 encounter (Office Visit) with McLean-Scocuzza, Nino Glow, MD  Medication   cyanocobalamin ((VITAMIN B-12)) injection 1,000 mcg   Allergies  Allergen Reactions   Trazodone And Nefazodone     Felt weird     No results  found for this or any previous visit (from the past 2160 hour(s)). Objective  Body mass index is 42.44 kg/m. Wt Readings from Last 3 Encounters:  10/16/21 295 lb 12.8 oz (134.2 kg)  10/07/21 290 lb (131.5 kg)  09/09/21 296 lb (134.3 kg)   Temp Readings from Last 3 Encounters:  10/16/21 (!) 97 F (36.1 C) (Temporal)  10/07/21 (!) 97.5 F (36.4 C) (Oral)  09/09/21 (!) 97.4 F (36.3 C) (Oral)   BP Readings from Last 3 Encounters:  10/16/21 124/82  10/07/21 130/85  09/09/21 134/83   Pulse Readings from Last 3 Encounters:  10/16/21 77  10/07/21 81  09/09/21 83    Physical Exam Vitals and nursing note reviewed.  Constitutional:      Appearance: Normal appearance. She is well-developed and well-groomed.  HENT:     Head: Normocephalic and atraumatic.  Eyes:     Conjunctiva/sclera: Conjunctivae normal.     Pupils: Pupils are equal, round, and reactive to light.  Cardiovascular:     Rate and Rhythm: Normal rate and regular rhythm.     Heart sounds: Normal heart sounds. No murmur heard. Pulmonary:     Effort: Pulmonary effort is normal.     Breath sounds: Normal breath sounds.  Abdominal:     General: Abdomen is flat. Bowel sounds are normal.     Tenderness: There is no abdominal tenderness.  Musculoskeletal:        General: No tenderness.  Skin:    General: Skin is warm and dry.  Neurological:     General: No focal deficit present.     Mental Status: She is alert and oriented to person, place, and time. Mental status is at baseline.     Cranial Nerves: Cranial nerves 2-12 are intact.     Gait: Gait is intact.  Psychiatric:        Attention and Perception: Attention and perception normal.        Mood and Affect: Mood and affect normal.        Speech: Speech normal.        Behavior: Behavior normal. Behavior is cooperative.        Thought Content: Thought content normal.        Cognition and Memory: Cognition and memory normal.        Judgment: Judgment normal.     Assessment  Plan  Hypertension, improved -  Plan: Comprehensive metabolic panel Norvasc 5 mg if swelling returns consider cut back to 2.5 cont spironlactone 25 mg qd   B12 deficiency - Plan: given shot today SYRINGE-NEEDLE, DISP, 3 ML 25G X 1" 3 ML MISC, cyanocobalamin ((VITAMIN B-12)) injection 1,000 mcg  Vitamin D deficiency - Plan: Vitamin D (25 hydroxy)  Hypocalcemia - Plan: Comprehensive metabolic panel Rec mvt daily otc   Insomnia, unspecified type - Plan: zolpidem (AMBIEN CR) 12.5 MG CR tablet Helping   Iron deficiency - Plan: IBC + Ferritin  Leg edema improved with diuretic   HM Flu shot utd given today  2/2 pfizer consider boosters Tdap thinks had in 2018 consider in future no record LMP 12/09/2018, Pap 2018 OB/GYN in [place] get records   Pap negative 10/11/17 neg pap neg HPV s/p hysterectomy -no need for further s/p hysterectomy 2020 [facility]   mammo 8/922 negative GIB   Disc colonoscopy age 13   Provider: Dr. Olivia Mackie McLean-Scocuzza-Internal Medicine

## 2021-10-17 ENCOUNTER — Other Ambulatory Visit: Payer: Self-pay | Admitting: Internal Medicine

## 2021-10-17 DIAGNOSIS — E559 Vitamin D deficiency, unspecified: Secondary | ICD-10-CM

## 2021-10-17 MED ORDER — VITAMIN D (ERGOCALCIFEROL) 1.25 MG (50000 UNIT) PO CAPS: 50000.0000 [IU] | ORAL_CAPSULE | ORAL | 1 refills | Status: DC

## 2021-11-03 ENCOUNTER — Encounter (INDEPENDENT_AMBULATORY_CARE_PROVIDER_SITE_OTHER): Payer: Self-pay | Admitting: Family Medicine

## 2021-11-03 NOTE — Telephone Encounter (Signed)
Last OV with Dr Wallace 

## 2021-11-04 NOTE — Telephone Encounter (Signed)
Cleared RX with pharmacy and they are filling RX, pt notified

## 2021-11-06 ENCOUNTER — Ambulatory Visit (INDEPENDENT_AMBULATORY_CARE_PROVIDER_SITE_OTHER): Payer: BC Managed Care – PPO | Admitting: Family Medicine

## 2021-11-06 ENCOUNTER — Other Ambulatory Visit: Payer: Self-pay

## 2021-11-06 ENCOUNTER — Encounter (INDEPENDENT_AMBULATORY_CARE_PROVIDER_SITE_OTHER): Payer: Self-pay | Admitting: Family Medicine

## 2021-11-06 VITALS — BP 130/83 | HR 77 | Temp 97.7°F | Ht 70.0 in | Wt 283.0 lb

## 2021-11-06 DIAGNOSIS — F5081 Binge eating disorder: Secondary | ICD-10-CM

## 2021-11-06 DIAGNOSIS — E1169 Type 2 diabetes mellitus with other specified complication: Secondary | ICD-10-CM

## 2021-11-06 DIAGNOSIS — Z6841 Body Mass Index (BMI) 40.0 and over, adult: Secondary | ICD-10-CM

## 2021-11-06 DIAGNOSIS — I10 Essential (primary) hypertension: Secondary | ICD-10-CM

## 2021-11-06 MED ORDER — LISDEXAMFETAMINE DIMESYLATE 60 MG PO CAPS
60.0000 mg | ORAL_CAPSULE | ORAL | 0 refills | Status: DC
Start: 2021-11-06 — End: 2021-12-16

## 2021-11-06 MED ORDER — OZEMPIC (2 MG/DOSE) 8 MG/3ML ~~LOC~~ SOPN
2.0000 mg | PEN_INJECTOR | SUBCUTANEOUS | 2 refills | Status: DC
  Filled 2021-11-06 – 2021-11-13 (×2): qty 3, 28d supply, fill #0
  Filled 2021-11-13 – 2021-11-21 (×2): qty 9, 84d supply, fill #0

## 2021-11-12 NOTE — Progress Notes (Signed)
Chief Complaint:   OBESITY Alyssa Cain is here to discuss her progress with her obesity treatment plan along with follow-up of her obesity related diagnoses. See Medical Weight Management Flowsheet for complete bioelectrical impedance results.  Today's visit was #: 65 Starting weight: 372 lbs Starting date: 07/04/2020 Weight change since last visit: 7 lbs Total lbs lost to date: 73 lbs Total weight loss percentage to date: -23.92%  Nutrition Plan: Keeping a food journal and adhering to recommended goals of 1500 calories and 100 grams of protein daily for 100% of the time. Activity: None. Anti-obesity medications: Ozempic 1 mg subcutaneously twice weekly. Reported side effects: None.  Interim History: Alyssa Cain has had no Ozempic for 2 weeks.  Assessment/Plan:   1. Essential hypertension, with LE Edema At goal.  Medication: Norvasc 5 mg daily, spironolactone 25 mg daily.   Plan:  Continue Norvasc 5 mg daily.  Okay to increase spironolactone to 50 mg daily.  Avoid buying foods that are: processed, frozen, or prepackaged to avoid excess salt. We will watch for signs of hypotension as she continues lifestyle modifications.  BP Readings from Last 3 Encounters:  11/06/21 130/83  10/16/21 124/82  10/07/21 130/85   Lab Results  Component Value Date   CREATININE 0.77 10/16/2021   2. Type 2 diabetes mellitus with other specified complication, without long-term current use of insulin (HCC) Diabetes Mellitus: At goal. Medication: Ozempic 1 mg subcutaneously twice weekly. Issues reviewed: blood sugar goals, complications of diabetes mellitus, hypoglycemia prevention and treatment, exercise, and nutrition.  Plan:  Continue Ozempic 2 mg subcutaneously weekly.  Refill sent to pharmacy today. The patient will continue to focus on protein-rich, low simple carbohydrate foods. We reviewed the importance of hydration, regular exercise for stress reduction, and restorative sleep.   Lab Results   Component Value Date   HGBA1C 5.3 10/02/2020   HGBA1C 5.5 12/23/2018   Lab Results  Component Value Date   LDLCALC 84 04/16/2021   CREATININE 0.77 10/16/2021   - Refill Semaglutide, 2 MG/DOSE, (OZEMPIC, 2 MG/DOSE,) 8 MG/3ML SOPN; Inject 2 mg into the skin once a week.  Dispense: 9 mL; Refill: 2  3. Binge eating disorder Alyssa Cain is taking Vyvanse 60 mg daily.  She will continue Vyvanse at this dose.  Will refill today.  The goals for treatment of BED are to reduce eating binges and to achieve healthy eating habits. Because binge eating can correlate with negative emotions, treatment may also address any other mental-health issues, such as depression.  People who binge eat feel as if they don't have control over how much they eat and have feelings of guilt or self-loathing after a binge eating episode.  The FDA has approved Vyvanse as a treatment option for binge eating disorder. Vyvanse targets the brain's reward center by increasing the levels of dopamine and norepinephrine, the chemicals of the brain responsible for feelings of pleasure.  Mindful eating is the recommended nutritional approach to treating BED.   - Refill lisdexamfetamine (VYVANSE) 60 MG capsule; Take 1 capsule (60 mg total) by mouth every morning.  Dispense: 30 capsule; Refill: 0  I have consulted the [place] Controlled Substances Registry for this patient, and feel the risk/benefit ratio today is favorable for proceeding with this prescription for a controlled substance. The patient understands monitoring parameters and red flags.   4. Obesity, current BMI 40.6  Course: Alyssa Cain is currently in the action stage of change. As such, her goal is to continue with weight loss efforts.  Nutrition goals: She has agreed to keeping a food journal and adhering to recommended goals of 1500 calories and 100 grams of protein.   Exercise goals: For substantial health benefits, adults should do at least 150 minutes (2 hours and 30  minutes) a week of moderate-intensity, or 75 minutes (1 hour and 15 minutes) a week of vigorous-intensity aerobic physical activity, or an equivalent combination of moderate- and vigorous-intensity aerobic activity. Aerobic activity should be performed in episodes of at least 10 minutes, and preferably, it should be spread throughout the week.  Behavioral modification strategies: increasing lean protein intake, decreasing simple carbohydrates, increasing vegetables, and increasing water intake.  Alyssa Cain has agreed to follow-up with our clinic in 4-6 weeks. She was informed of the importance of frequent follow-up visits to maximize her success with intensive lifestyle modifications for her multiple health conditions.   Objective:   Blood pressure 130/83, pulse 77, temperature 97.7 F (36.5 C), temperature source Oral, height 5\' 10"  (1.778 m), weight 283 lb (128.4 kg), SpO2 100 %. Body mass index is 40.61 kg/m.  General: Cooperative, alert, well developed, in no acute distress. HEENT: Conjunctivae and lids unremarkable. Cardiovascular: Regular rhythm.  Lungs: Normal work of breathing. Neurologic: No focal deficits.   Lab Results  Component Value Date   CREATININE 0.77 10/16/2021   BUN 7 10/16/2021   NA 138 10/16/2021   K 3.5 10/16/2021   CL 104 10/16/2021   CO2 26 10/16/2021   Lab Results  Component Value Date   ALT 14 10/16/2021   AST 15 10/16/2021   ALKPHOS 125 (H) 10/16/2021   BILITOT 0.5 10/16/2021   Lab Results  Component Value Date   HGBA1C 5.3 10/02/2020   HGBA1C 5.5 12/23/2018   Lab Results  Component Value Date   INSULIN 6.6 10/02/2020   INSULIN 11.8 07/04/2020   Lab Results  Component Value Date   TSH 0.84 10/16/2021   Lab Results  Component Value Date   CHOL 175 04/16/2021   HDL 73.80 04/16/2021   LDLCALC 84 04/16/2021   TRIG 84.0 04/16/2021   CHOLHDL 2 04/16/2021   Lab Results  Component Value Date   VD25OH 18.69 (L) 10/16/2021   VD25OH 25.42 (L)  04/16/2021   VD25OH 22.5 (L) 10/02/2020   Lab Results  Component Value Date   WBC 4.4 06/15/2021   HGB 13.1 06/15/2021   HCT 40.4 06/15/2021   MCV 89.8 06/15/2021   PLT 321 06/15/2021   Lab Results  Component Value Date   IRON 80 10/16/2021   TIBC 282.8 10/16/2021   FERRITIN 281.8 10/16/2021   Attestation Statements:   Reviewed by clinician on day of visit: allergies, medications, problem list, medical history, surgical history, family history, social history, and previous encounter notes.  I, Water quality scientist, CMA, am acting as transcriptionist for Briscoe Deutscher, DO  I have reviewed the above documentation for accuracy and completeness, and I agree with the above. -  Briscoe Deutscher, DO, MS, FAAFP, DABOM - Family and Bariatric Medicine.

## 2021-11-13 ENCOUNTER — Other Ambulatory Visit: Payer: Self-pay

## 2021-11-17 ENCOUNTER — Other Ambulatory Visit: Payer: Self-pay

## 2021-11-17 ENCOUNTER — Encounter: Payer: Self-pay | Admitting: Internal Medicine

## 2021-11-21 ENCOUNTER — Other Ambulatory Visit: Payer: Self-pay

## 2021-11-21 ENCOUNTER — Encounter (INDEPENDENT_AMBULATORY_CARE_PROVIDER_SITE_OTHER): Payer: Self-pay | Admitting: Family Medicine

## 2021-11-21 DIAGNOSIS — I10 Essential (primary) hypertension: Secondary | ICD-10-CM

## 2021-11-24 MED ORDER — SPIRONOLACTONE 50 MG PO TABS: 50.0000 mg | ORAL_TABLET | Freq: Every day | ORAL | 3 refills | Status: AC

## 2021-11-24 NOTE — Telephone Encounter (Signed)
Pt last seen by Dr. Wallace.  

## 2021-11-25 ENCOUNTER — Other Ambulatory Visit: Payer: Self-pay

## 2021-12-15 ENCOUNTER — Encounter (INDEPENDENT_AMBULATORY_CARE_PROVIDER_SITE_OTHER): Payer: Self-pay | Admitting: Family Medicine

## 2021-12-15 ENCOUNTER — Other Ambulatory Visit (INDEPENDENT_AMBULATORY_CARE_PROVIDER_SITE_OTHER): Payer: Self-pay | Admitting: Family Medicine

## 2021-12-15 DIAGNOSIS — F3289 Other specified depressive episodes: Secondary | ICD-10-CM

## 2021-12-15 DIAGNOSIS — F5081 Binge eating disorder: Secondary | ICD-10-CM

## 2021-12-16 MED ORDER — BUPROPION HCL ER (SR) 150 MG PO TB12: 150.0000 mg | ORAL_TABLET | Freq: Every day | ORAL | 0 refills | Status: DC

## 2021-12-16 MED ORDER — LISDEXAMFETAMINE DIMESYLATE 60 MG PO CAPS: 60.0000 mg | ORAL_CAPSULE | ORAL | 0 refills | Status: DC

## 2021-12-16 NOTE — Telephone Encounter (Signed)
Dr.Wallace °

## 2021-12-25 ENCOUNTER — Other Ambulatory Visit: Payer: Self-pay

## 2021-12-25 ENCOUNTER — Encounter (INDEPENDENT_AMBULATORY_CARE_PROVIDER_SITE_OTHER): Payer: Self-pay | Admitting: Family Medicine

## 2021-12-25 ENCOUNTER — Ambulatory Visit (INDEPENDENT_AMBULATORY_CARE_PROVIDER_SITE_OTHER): Payer: BC Managed Care – PPO | Admitting: Family Medicine

## 2021-12-25 VITALS — BP 129/84 | HR 74 | Temp 97.9°F | Ht 70.0 in | Wt 279.0 lb

## 2021-12-25 DIAGNOSIS — E1169 Type 2 diabetes mellitus with other specified complication: Secondary | ICD-10-CM | POA: Diagnosis not present

## 2021-12-25 DIAGNOSIS — E669 Obesity, unspecified: Secondary | ICD-10-CM

## 2021-12-25 DIAGNOSIS — Z7985 Long-term (current) use of injectable non-insulin antidiabetic drugs: Secondary | ICD-10-CM

## 2021-12-25 DIAGNOSIS — F5081 Binge eating disorder: Secondary | ICD-10-CM

## 2021-12-25 DIAGNOSIS — F3289 Other specified depressive episodes: Secondary | ICD-10-CM

## 2021-12-25 DIAGNOSIS — Z6841 Body Mass Index (BMI) 40.0 and over, adult: Secondary | ICD-10-CM

## 2021-12-25 DIAGNOSIS — F50819 Binge eating disorder, unspecified: Secondary | ICD-10-CM

## 2021-12-25 MED ORDER — TIRZEPATIDE 10 MG/0.5ML ~~LOC~~ SOAJ: 10.0000 mg | SUBCUTANEOUS | 0 refills | Status: DC

## 2021-12-29 NOTE — Progress Notes (Signed)
Chief Complaint:   OBESITY Alyssa Cain is here to discuss her progress with her obesity treatment plan along with follow-up of her obesity related diagnoses. See Medical Weight Management Flowsheet for complete bioelectrical impedance results.  Today's visit was #: 20 Starting weight: 372 lbs Starting date: 07/04/2020 Weight change since last visit: 4 lbs Total lbs lost to date: 93 lbs Total weight loss percentage to date: -25.00%  Nutrition Plan: Keeping a food journal and adhering to recommended goals of 1500 calories and 100 grams of protein daily for 100% of the time. Activity: Increased walking. Anti-obesity medications: Ozempic 2 mg subcutaneously weekly. Reported side effects: None.  Interim History: Alyssa Cain says she has increased her water intake.  She is down 93 pounds!  She endorses increased polyphagia.  Assessment/Plan:   1. Type 2 diabetes mellitus with other specified complication, without long-term current use of insulin (HCC) Diabetes Mellitus: At goal. Medication: Ozempic 2 mg subcutaneously weekly. Issues reviewed: blood sugar goals, complications of diabetes mellitus, hypoglycemia prevention and treatment, exercise, and nutrition.  Plan:  Stop Ozempic and start Mounjaro 10 mg subcutaneously weekly. The patient will continue to focus on protein-rich, low simple carbohydrate foods. We reviewed the importance of hydration, regular exercise for stress reduction, and restorative sleep.   Lab Results  Component Value Date   HGBA1C 5.3 10/02/2020   HGBA1C 5.5 12/23/2018   Lab Results  Component Value Date   LDLCALC 84 04/16/2021   CREATININE 0.77 10/16/2021   - tirzepatide (MOUNJARO) 10 MG/0.5ML Pen; Inject 10 mg into the skin once a week.  Dispense: 2 mL; Refill: 0  2. Binge eating disorder Alyssa Cain is taking Vyvanse 60 mg daily.  She will continue Vyvanse at this dose.    The goals for treatment of BED are to reduce eating binges and to achieve healthy eating  habits. Because binge eating can correlate with negative emotions, treatment may also address any other mental-health issues, such as depression.  People who binge eat feel as if they don't have control over how much they eat and have feelings of guilt or self-loathing after a binge eating episode.  The FDA has approved Vyvanse as a treatment option for binge eating disorder. Vyvanse targets the brain's reward center by increasing the levels of dopamine and norepinephrine, the chemicals of the brain responsible for feelings of pleasure.  Mindful eating is the recommended nutritional approach to treating BED.   3. Other depression, with emotional eating Medication: Wellbutrin 150 mg daily.   Plan:  Discussed cues and consequences, how thoughts affect eating, model of thoughts, feelings, and behaviors, and strategies for change by focusing on the cue. Discussed cognitive distortions, coping thoughts, and how to change your thoughts.  4. Obesity, current BMI 40.1  Course: Alyssa Cain is currently in the action stage of change. As such, her goal is to continue with weight loss efforts.   Nutrition goals: She has agreed to keeping a food journal and adhering to recommended goals of 1500 calories and 100 grams of protein.   Exercise goals:  As is.  Behavioral modification strategies: increasing lean protein intake, decreasing simple carbohydrates, increasing vegetables, and increasing water intake.  Alyssa Cain has agreed to follow-up with our clinic in 4 weeks. She was informed of the importance of frequent follow-up visits to maximize her success with intensive lifestyle modifications for her multiple health conditions.   Objective:   Blood pressure 129/84, pulse 74, temperature 97.9 F (36.6 C), temperature source Oral, height 5\' 10"  (1.778  m), weight 279 lb (126.6 kg), SpO2 96 %. Body mass index is 40.03 kg/m.  General: Cooperative, alert, well developed, in no acute distress. HEENT: Conjunctivae  and lids unremarkable. Cardiovascular: Regular rhythm.  Lungs: Normal work of breathing. Neurologic: No focal deficits.   Lab Results  Component Value Date   CREATININE 0.77 10/16/2021   BUN 7 10/16/2021   NA 138 10/16/2021   K 3.5 10/16/2021   CL 104 10/16/2021   CO2 26 10/16/2021   Lab Results  Component Value Date   ALT 14 10/16/2021   AST 15 10/16/2021   ALKPHOS 125 (H) 10/16/2021   BILITOT 0.5 10/16/2021   Lab Results  Component Value Date   HGBA1C 5.3 10/02/2020   HGBA1C 5.5 12/23/2018   Lab Results  Component Value Date   INSULIN 6.6 10/02/2020   INSULIN 11.8 07/04/2020   Lab Results  Component Value Date   TSH 0.84 10/16/2021   Lab Results  Component Value Date   CHOL 175 04/16/2021   HDL 73.80 04/16/2021   LDLCALC 84 04/16/2021   TRIG 84.0 04/16/2021   CHOLHDL 2 04/16/2021   Lab Results  Component Value Date   VD25OH 18.69 (L) 10/16/2021   VD25OH 25.42 (L) 04/16/2021   VD25OH 22.5 (L) 10/02/2020   Lab Results  Component Value Date   WBC 4.4 06/15/2021   HGB 13.1 06/15/2021   HCT 40.4 06/15/2021   MCV 89.8 06/15/2021   PLT 321 06/15/2021   Lab Results  Component Value Date   IRON 80 10/16/2021   TIBC 282.8 10/16/2021   FERRITIN 281.8 10/16/2021   Attestation Statements:   Reviewed by clinician on day of visit: allergies, medications, problem list, medical history, surgical history, family history, social history, and previous encounter notes.  I, Water quality scientist, CMA, am acting as transcriptionist for Briscoe Deutscher, DO  I have reviewed the above documentation for accuracy and completeness, and I agree with the above. -  Briscoe Deutscher, DO, MS, FAAFP, DABOM - Family and Bariatric Medicine.

## 2022-01-13 ENCOUNTER — Other Ambulatory Visit (INDEPENDENT_AMBULATORY_CARE_PROVIDER_SITE_OTHER): Payer: Self-pay | Admitting: Family Medicine

## 2022-01-13 DIAGNOSIS — F5081 Binge eating disorder: Secondary | ICD-10-CM

## 2022-01-13 MED ORDER — LISDEXAMFETAMINE DIMESYLATE 60 MG PO CAPS: 60.0000 mg | ORAL_CAPSULE | ORAL | 0 refills | Status: DC

## 2022-01-13 NOTE — Telephone Encounter (Signed)
Dr.Wallace °

## 2022-02-02 ENCOUNTER — Other Ambulatory Visit: Payer: Self-pay

## 2022-02-02 ENCOUNTER — Encounter (INDEPENDENT_AMBULATORY_CARE_PROVIDER_SITE_OTHER): Payer: Self-pay | Admitting: Family Medicine

## 2022-02-02 ENCOUNTER — Telehealth (INDEPENDENT_AMBULATORY_CARE_PROVIDER_SITE_OTHER): Payer: Self-pay | Admitting: Family Medicine

## 2022-02-02 ENCOUNTER — Ambulatory Visit (INDEPENDENT_AMBULATORY_CARE_PROVIDER_SITE_OTHER): Payer: BC Managed Care – PPO | Admitting: Family Medicine

## 2022-02-02 ENCOUNTER — Encounter (INDEPENDENT_AMBULATORY_CARE_PROVIDER_SITE_OTHER): Payer: Self-pay

## 2022-02-02 VITALS — BP 131/84 | HR 73 | Temp 97.6°F | Ht 70.0 in | Wt 269.0 lb

## 2022-02-02 DIAGNOSIS — E669 Obesity, unspecified: Secondary | ICD-10-CM

## 2022-02-02 DIAGNOSIS — F5081 Binge eating disorder: Secondary | ICD-10-CM | POA: Diagnosis not present

## 2022-02-02 DIAGNOSIS — Z6838 Body mass index (BMI) 38.0-38.9, adult: Secondary | ICD-10-CM

## 2022-02-02 DIAGNOSIS — E1169 Type 2 diabetes mellitus with other specified complication: Secondary | ICD-10-CM

## 2022-02-02 DIAGNOSIS — I10 Essential (primary) hypertension: Secondary | ICD-10-CM

## 2022-02-02 MED ORDER — TIRZEPATIDE 10 MG/0.5ML ~~LOC~~ SOAJ
10.0000 mg | SUBCUTANEOUS | 0 refills | Status: DC
  Filled 2022-02-02: qty 2, 28d supply, fill #0

## 2022-02-02 NOTE — Telephone Encounter (Signed)
Prior authorization approved for [facility]. Effective 02/02/2022 - 02/03/2023. Patient sent approval message via mychart.  ?

## 2022-02-05 ENCOUNTER — Other Ambulatory Visit: Payer: Self-pay

## 2022-02-05 DIAGNOSIS — M25561 Pain in right knee: Secondary | ICD-10-CM | POA: Diagnosis not present

## 2022-02-05 DIAGNOSIS — M7121 Synovial cyst of popliteal space [Baker], right knee: Secondary | ICD-10-CM | POA: Diagnosis not present

## 2022-02-05 DIAGNOSIS — M1711 Unilateral primary osteoarthritis, right knee: Secondary | ICD-10-CM | POA: Diagnosis not present

## 2022-02-05 DIAGNOSIS — M25461 Effusion, right knee: Secondary | ICD-10-CM | POA: Diagnosis not present

## 2022-02-10 NOTE — Progress Notes (Signed)
Chief Complaint:   OBESITY Alyssa Cain is here to discuss her progress with her obesity treatment plan along with follow-up of her obesity related diagnoses. See Medical Weight Management Flowsheet for complete bioelectrical impedance results.  Today's visit was #: 21 Starting weight: 372 lbs Starting date: 07/04/2020 Weight change since last visit: 10 lbs Total lbs lost to date: 103 lbs Total weight loss percentage to date: -27.69%  Nutrition Plan: Keeping a food journal and adhering to recommended goals of 1500 calories and 100 grams of protein daily for 100% of the time. Activity: Increased walking. Anti-obesity medications: Mounjaro 10 mg subcutaneously weekly. Reported side effects: None.  Interim History: Alyssa Cain is scheduled for an injection (5 months versus her usual 3 months) and then will be going on vacation from April 30-May 8.  She says the pool is opening again.  Assessment/Plan:   1. Type 2 diabetes mellitus with other specified complication, without long-term current use of insulin (HCC) Diabetes Mellitus: Controlled. Medication: Mounjaro 10 mg subcutaneously weekly. Issues reviewed: blood sugar goals, complications of diabetes mellitus, hypoglycemia prevention and treatment, exercise, and nutrition.  Plan: Continue Moujaro 10 mg once weekly.  The patient will continue to focus on protein-rich, low simple carbohydrate foods. We reviewed the importance of hydration, regular exercise for stress reduction, and restorative sleep.   Lab Results  Component Value Date   HGBA1C 5.3 10/02/2020   HGBA1C 5.5 12/23/2018   Lab Results  Component Value Date   LDLCALC 84 04/16/2021   CREATININE 0.77 10/16/2021   - Refill tirzepatide (MOUNJARO) 10 MG/0.5ML Pen; Inject 10 mg into the skin once a week.  Dispense: 2 mL; Refill: 0  2. Essential hypertension At goal. Medications: Norvasc 5 mg daily, spironolactone 50 mg daily.   Plan: Avoid buying foods that are: processed,  frozen, or prepackaged to avoid excess salt. We will watch for signs of hypotension as she continues lifestyle modifications.  BP Readings from Last 3 Encounters:  02/02/22 131/84  12/25/21 129/84  11/06/21 130/83   Lab Results  Component Value Date   CREATININE 0.77 10/16/2021   3. Binge eating disorder Alyssa Cain is taking Vyvanse 60 mg daily for BED.  Plan: Continue Vyvanse 60 mg daily.  The goals for treatment of BED are to reduce eating binges and to achieve healthy eating habits. Because binge eating can correlate with negative emotions, treatment may also address any other mental-health issues, such as depression.  People who binge eat feel as if they don't have control over how much they eat and have feelings of guilt or self-loathing after a binge eating episode.  The FDA has approved Vyvanse as a treatment option for binge eating disorder. Vyvanse targets the brain's reward center by increasing the levels of dopamine and norepinephrine, the chemicals of the brain responsible for feelings of pleasure.  Mindful eating is the recommended nutritional approach to treating BED.   4. Obesity, current BMI 38.6  Course: Alyssa Cain is currently in the action stage of change. As such, her goal is to continue with weight loss efforts.   Nutrition goals: She has agreed to keeping a food journal and adhering to recommended goals of 1500 calories and 100 grams of protein.   Exercise goals:  As is.  Behavioral modification strategies: increasing lean protein intake, decreasing simple carbohydrates, and increasing vegetables.  Monti has agreed to follow-up with our clinic in 3 weeks. She was informed of the importance of frequent follow-up visits to maximize her success with intensive  lifestyle modifications for her multiple health conditions.   Objective:   Blood pressure 131/84, pulse 73, temperature 97.6 F (36.4 C), temperature source Oral, height '5\' 10"'$  (1.778 m), weight 269 lb (122 kg),  SpO2 100 %. Body mass index is 38.6 kg/m.  General: Cooperative, alert, well developed, in no acute distress. HEENT: Conjunctivae and lids unremarkable. Cardiovascular: Regular rhythm.  Lungs: Normal work of breathing. Neurologic: No focal deficits.   Lab Results  Component Value Date   CREATININE 0.77 10/16/2021   BUN 7 10/16/2021   NA 138 10/16/2021   K 3.5 10/16/2021   CL 104 10/16/2021   CO2 26 10/16/2021   Lab Results  Component Value Date   ALT 14 10/16/2021   AST 15 10/16/2021   ALKPHOS 125 (H) 10/16/2021   BILITOT 0.5 10/16/2021   Lab Results  Component Value Date   HGBA1C 5.3 10/02/2020   HGBA1C 5.5 12/23/2018   Lab Results  Component Value Date   INSULIN 6.6 10/02/2020   INSULIN 11.8 07/04/2020   Lab Results  Component Value Date   TSH 0.84 10/16/2021   Lab Results  Component Value Date   CHOL 175 04/16/2021   HDL 73.80 04/16/2021   LDLCALC 84 04/16/2021   TRIG 84.0 04/16/2021   CHOLHDL 2 04/16/2021   Lab Results  Component Value Date   VD25OH 18.69 (L) 10/16/2021   VD25OH 25.42 (L) 04/16/2021   VD25OH 22.5 (L) 10/02/2020   Lab Results  Component Value Date   WBC 4.4 06/15/2021   HGB 13.1 06/15/2021   HCT 40.4 06/15/2021   MCV 89.8 06/15/2021   PLT 321 06/15/2021   Lab Results  Component Value Date   IRON 80 10/16/2021   TIBC 282.8 10/16/2021   FERRITIN 281.8 10/16/2021   Attestation Statements:   Reviewed by clinician on day of visit: allergies, medications, problem list, medical history, surgical history, family history, social history, and previous encounter notes.  I, Water quality scientist, CMA, am acting as transcriptionist for Briscoe Deutscher, DO  I have reviewed the above documentation for accuracy and completeness, and I agree with the above. -  Briscoe Deutscher, DO, MS, FAAFP, DABOM - Family and Bariatric Medicine.

## 2022-02-12 ENCOUNTER — Other Ambulatory Visit (INDEPENDENT_AMBULATORY_CARE_PROVIDER_SITE_OTHER): Payer: Self-pay | Admitting: Family Medicine

## 2022-02-12 DIAGNOSIS — F5081 Binge eating disorder: Secondary | ICD-10-CM

## 2022-02-13 MED ORDER — LISDEXAMFETAMINE DIMESYLATE 60 MG PO CAPS: 60.0000 mg | ORAL_CAPSULE | ORAL | 0 refills | Status: DC

## 2022-02-24 ENCOUNTER — Telehealth (INDEPENDENT_AMBULATORY_CARE_PROVIDER_SITE_OTHER): Payer: BC Managed Care – PPO | Admitting: Family Medicine

## 2022-02-24 ENCOUNTER — Encounter (INDEPENDENT_AMBULATORY_CARE_PROVIDER_SITE_OTHER): Payer: Self-pay | Admitting: Family Medicine

## 2022-02-24 DIAGNOSIS — F3289 Other specified depressive episodes: Secondary | ICD-10-CM | POA: Diagnosis not present

## 2022-02-24 DIAGNOSIS — E669 Obesity, unspecified: Secondary | ICD-10-CM

## 2022-02-24 DIAGNOSIS — Z6838 Body mass index (BMI) 38.0-38.9, adult: Secondary | ICD-10-CM

## 2022-02-24 DIAGNOSIS — F5081 Binge eating disorder: Secondary | ICD-10-CM | POA: Diagnosis not present

## 2022-02-24 DIAGNOSIS — Z7985 Long-term (current) use of injectable non-insulin antidiabetic drugs: Secondary | ICD-10-CM

## 2022-02-24 DIAGNOSIS — E1169 Type 2 diabetes mellitus with other specified complication: Secondary | ICD-10-CM

## 2022-02-24 MED ORDER — TIRZEPATIDE 10 MG/0.5ML ~~LOC~~ SOAJ
10.0000 mg | SUBCUTANEOUS | 0 refills | Status: DC
  Filled 2022-02-24: qty 2, 28d supply, fill #0

## 2022-02-24 MED ORDER — BUPROPION HCL ER (SR) 150 MG PO TB12: 150.0000 mg | ORAL_TABLET | Freq: Every day | ORAL | 0 refills | Status: AC

## 2022-02-24 MED ORDER — LISDEXAMFETAMINE DIMESYLATE 60 MG PO CAPS: 60.0000 mg | ORAL_CAPSULE | ORAL | 0 refills | Status: DC

## 2022-02-24 MED ORDER — TIRZEPATIDE 12.5 MG/0.5ML ~~LOC~~ SOAJ
12.5000 mg | SUBCUTANEOUS | 2 refills | Status: DC
Start: 2022-02-24 — End: 2023-02-28

## 2022-02-24 NOTE — Progress Notes (Signed)
TeleHealth Visit:  Due to the COVID-19 pandemic, this visit was completed with telemedicine (audio/video) technology to reduce patient and provider exposure as well as to preserve personal protective equipment.   Alyssa Cain has verbally consented to this TeleHealth visit. The patient is located at home, the provider is located at home. The participants in this visit include the listed provider and patient. The visit was conducted today via MyChart video.  OBESITY Alyssa Cain is here to discuss her progress with her obesity treatment plan along with follow-up of her obesity related diagnoses.   Today's visit was #: 22 Starting weight: 372 lbs Starting date: 07/04/2020 Today's date: 02/24/2022 Does not weight at home.   Interim History: Recently fir into a size 18-20 JEANS!  This is down from a size 32.   Nutrition Plan: keeping a food journal and adhering to recommended goals of 1500 calories and 100 grams of protein for 100 % of the time.  Anti-obesity medications: Mounjaro 10 mg subcutaneously weekly. Reported side effects: None. Hunger is well controlled. Cravings are moderately controlled.  Activity: Increased walking  Assessment/Plan:   Diagnoses and all orders for this visit:  Type 2 diabetes mellitus with other specified complication, without long-term current use of insulin ([place]) Diabetes Mellitus: At goal. Medication: Mounjaro. Issues reviewed: blood sugar goals, complications of diabetes mellitus, hypoglycemia prevention and treatment, exercise, and nutrition.  Plan: Well controlled.  No signs of complications, medication side effects, or red flags.  Continue current regimen.    Lab Results  Component Value Date   HGBA1C 5.3 10/02/2020   HGBA1C 5.5 12/23/2018   Lab Results  Component Value Date   LDLCALC 84 04/16/2021   CREATININE 0.77 10/16/2021   -     tirzepatide (MOUNJARO) 10 MG/0.5ML Pen; Inject 10 mg into the skin once a week. -     tirzepatide (MOUNJARO) 12.5  MG/0.5ML Pen; Inject 12.5 mg into the skin once a week.  Binge eating disorder At goal. The goals for treatment of BED are to reduce eating binges and to achieve healthy eating habits. Because binge eating can correlate with negative emotions, treatment may also address any other mental-health issues, such as depression.  People who binge eat feel as if they don't have control over how much they eat and have feelings of guilt or self-loathing after a binge eating episode.  The FDA has approved Vyvanse as a treatment option for binge eating disorder. Vyvanse targets the brain's reward center by increasing the levels of dopamine and norepinephrine, the chemicals of the brain responsible for feelings of pleasure.  Mindful eating is the recommended nutritional approach to treating BED.   -     lisdexamfetamine (VYVANSE) 60 MG capsule; Take 1 capsule (60 mg total) by mouth every morning.  Other depression, with emotional eating Discussed cues and consequences, how thoughts affect eating, model of thoughts, feelings, and behaviors, and strategies for change by focusing on the cue. Discussed cognitive distortions, coping thoughts, and how to change your thoughts. -     buPROPion (WELLBUTRIN SR) 150 MG 12 hr tablet; Take 1 tablet (150 mg total) by mouth daily.  Obesity, current BMI 38.6 Alyssa Cain is currently in the action stage of change. As such, her goal is to continue with weight loss efforts. She has agreed to practicing portion control and making smarter food choices, such as increasing vegetables and decreasing simple carbohydrates.   Exercise goals: For substantial health benefits, adults should do at least 150 minutes (2 hours and  30 minutes) a week of moderate-intensity, or 75 minutes (1 hour and 15 minutes) a week of vigorous-intensity aerobic physical activity, or an equivalent combination of moderate- and vigorous-intensity aerobic activity. Aerobic activity should be performed in episodes of at  least 10 minutes, and preferably, it should be spread throughout the week. Adults should also include muscle-strengthening activities that involve all major muscle groups on 2 or more days a week.  Behavioral modification strategies: increasing lean protein intake, decreasing simple carbohydrates, increasing vegetables, and increasing water intake.  Alyssa Cain has agreed to follow-up with our clinic in 4 weeks. She was informed of the importance of frequent follow-up visits to maximize her success with intensive lifestyle modifications for her multiple health conditions.  Objective:   VITALS: Per patient if applicable, see vitals. GENERAL: Alert and in no acute distress. CARDIOPULMONARY: No increased WOB. Speaking in clear sentences.  PSYCH: Pleasant and cooperative. Speech normal rate and rhythm. Affect is appropriate. Insight and judgement are appropriate. Attention is focused, linear, and appropriate.  NEURO: Oriented as arrived to appointment on time with no prompting.   Lab Results  Component Value Date   CREATININE 0.77 10/16/2021   BUN 7 10/16/2021   NA 138 10/16/2021   K 3.5 10/16/2021   CL 104 10/16/2021   CO2 26 10/16/2021   Lab Results  Component Value Date   ALT 14 10/16/2021   AST 15 10/16/2021   ALKPHOS 125 (H) 10/16/2021   BILITOT 0.5 10/16/2021   Lab Results  Component Value Date   HGBA1C 5.3 10/02/2020   HGBA1C 5.5 12/23/2018   Lab Results  Component Value Date   INSULIN 6.6 10/02/2020   INSULIN 11.8 07/04/2020   Lab Results  Component Value Date   TSH 0.84 10/16/2021   Lab Results  Component Value Date   CHOL 175 04/16/2021   HDL 73.80 04/16/2021   LDLCALC 84 04/16/2021   TRIG 84.0 04/16/2021   CHOLHDL 2 04/16/2021   Lab Results  Component Value Date   WBC 4.4 06/15/2021   HGB 13.1 06/15/2021   HCT 40.4 06/15/2021   MCV 89.8 06/15/2021   PLT 321 06/15/2021   Lab Results  Component Value Date   IRON 80 10/16/2021   TIBC 282.8 10/16/2021    FERRITIN 281.8 10/16/2021   Attestation Statements:   Reviewed by clinician on day of visit: allergies, medications, problem list, medical history, surgical history, family history, social history, and previous encounter notes.

## 2022-02-25 ENCOUNTER — Other Ambulatory Visit: Payer: Self-pay

## 2022-03-21 ENCOUNTER — Other Ambulatory Visit: Payer: Self-pay | Admitting: Internal Medicine

## 2022-03-21 DIAGNOSIS — I10 Essential (primary) hypertension: Secondary | ICD-10-CM

## 2022-04-01 DIAGNOSIS — E669 Obesity, unspecified: Secondary | ICD-10-CM | POA: Diagnosis not present

## 2022-04-01 DIAGNOSIS — F5081 Binge eating disorder: Secondary | ICD-10-CM | POA: Diagnosis not present

## 2022-04-01 DIAGNOSIS — Z9189 Other specified personal risk factors, not elsewhere classified: Secondary | ICD-10-CM | POA: Diagnosis not present

## 2022-04-01 DIAGNOSIS — E118 Type 2 diabetes mellitus with unspecified complications: Secondary | ICD-10-CM | POA: Diagnosis not present

## 2022-04-01 DIAGNOSIS — E1159 Type 2 diabetes mellitus with other circulatory complications: Secondary | ICD-10-CM | POA: Diagnosis not present

## 2022-04-15 DIAGNOSIS — I10 Essential (primary) hypertension: Secondary | ICD-10-CM | POA: Diagnosis not present

## 2022-04-15 DIAGNOSIS — F5081 Binge eating disorder: Secondary | ICD-10-CM | POA: Diagnosis not present

## 2022-04-15 DIAGNOSIS — E118 Type 2 diabetes mellitus with unspecified complications: Secondary | ICD-10-CM | POA: Diagnosis not present

## 2022-04-16 ENCOUNTER — Ambulatory Visit (INDEPENDENT_AMBULATORY_CARE_PROVIDER_SITE_OTHER): Payer: BC Managed Care – PPO | Admitting: Internal Medicine

## 2022-04-16 ENCOUNTER — Encounter: Payer: Self-pay | Admitting: Internal Medicine

## 2022-04-16 VITALS — BP 124/84 | Temp 98.3°F | Resp 14 | Ht 70.0 in | Wt 266.2 lb

## 2022-04-16 DIAGNOSIS — Z1231 Encounter for screening mammogram for malignant neoplasm of breast: Secondary | ICD-10-CM

## 2022-04-16 DIAGNOSIS — Z0000 Encounter for general adult medical examination without abnormal findings: Secondary | ICD-10-CM | POA: Diagnosis not present

## 2022-04-16 DIAGNOSIS — E538 Deficiency of other specified B group vitamins: Secondary | ICD-10-CM

## 2022-04-16 DIAGNOSIS — E559 Vitamin D deficiency, unspecified: Secondary | ICD-10-CM

## 2022-04-16 DIAGNOSIS — E611 Iron deficiency: Secondary | ICD-10-CM | POA: Diagnosis not present

## 2022-04-16 DIAGNOSIS — I10 Essential (primary) hypertension: Secondary | ICD-10-CM | POA: Diagnosis not present

## 2022-04-16 DIAGNOSIS — G4700 Insomnia, unspecified: Secondary | ICD-10-CM | POA: Diagnosis not present

## 2022-04-16 LAB — COMPREHENSIVE METABOLIC PANEL
ALT: 35 U/L (ref 0–35)
AST: 30 U/L (ref 0–37)
Albumin: 4.2 g/dL (ref 3.5–5.2)
Alkaline Phosphatase: 110 U/L (ref 39–117)
BUN: 8 mg/dL (ref 6–23)
CO2: 25 mEq/L (ref 19–32)
Calcium: 8.9 mg/dL (ref 8.4–10.5)
Chloride: 105 mEq/L (ref 96–112)
Creatinine, Ser: 0.85 mg/dL (ref 0.40–1.20)
GFR: 83.47 mL/min (ref >60.00)
Glucose, Bld: 74 mg/dL (ref 70–99)
Potassium: 4 mEq/L (ref 3.5–5.1)
Sodium: 140 mEq/L (ref 135–145)
Total Bilirubin: 0.6 mg/dL (ref 0.2–1.2)
Total Protein: 6.7 g/dL (ref 6.0–8.3)

## 2022-04-16 LAB — CBC WITH DIFFERENTIAL/PLATELET
Basophils Absolute: 0 10*3/uL (ref 0.0–0.1)
Basophils Relative: 1.1 % (ref 0.0–3.0)
Eosinophils Absolute: 0 10*3/uL (ref 0.0–0.7)
Eosinophils Relative: 0.7 % (ref 0.0–5.0)
HCT: 39.7 % (ref 36.0–46.0)
Hemoglobin: 12.8 g/dL (ref 12.0–15.0)
Lymphocytes Relative: 42.2 % (ref 12.0–46.0)
Lymphs Abs: 1.8 10*3/uL (ref 0.7–4.0)
MCHC: 32.3 g/dL (ref 30.0–36.0)
MCV: 89.5 fl (ref 78.0–100.0)
Monocytes Absolute: 0.3 10*3/uL (ref 0.1–1.0)
Monocytes Relative: 6.2 % (ref 3.0–12.0)
Neutro Abs: 2.1 10*3/uL (ref 1.4–7.7)
Neutrophils Relative %: 49.8 % (ref 43.0–77.0)
Platelets: 285 10*3/uL (ref 150.0–400.0)
RBC: 4.43 Mil/uL (ref 3.87–5.11)
RDW: 13.6 % (ref 11.5–15.5)
WBC: 4.2 10*3/uL (ref 4.0–10.5)

## 2022-04-16 LAB — IBC + FERRITIN
Ferritin: 302.8 ng/mL — ABNORMAL HIGH (ref 10.0–291.0)
Iron: 69 ug/dL (ref 42–145)
Saturation Ratios: 24.2 % (ref 20.0–50.0)
TIBC: 285.6 ug/dL (ref 250.0–450.0)
Transferrin: 204 mg/dL — ABNORMAL LOW (ref 212.0–360.0)

## 2022-04-16 LAB — LIPID PANEL
Cholesterol: 173 mg/dL (ref 0–200)
HDL: 79.7 mg/dL (ref >39.00)
LDL Cholesterol: 78 mg/dL (ref 0–99)
NonHDL: 93.19
Total CHOL/HDL Ratio: 2
Triglycerides: 76 mg/dL (ref 0.0–149.0)
VLDL: 15.2 mg/dL (ref 0.0–40.0)

## 2022-04-16 LAB — VITAMIN B12: Vitamin B-12: 720 pg/mL (ref 211–911)

## 2022-04-16 LAB — VITAMIN D 25 HYDROXY (VIT D DEFICIENCY, FRACTURES): VITD: 43.76 ng/mL (ref 30.00–100.00)

## 2022-04-16 MED ORDER — CYANOCOBALAMIN 1000 MCG/ML IJ SOLN: 1000.0000 ug | INTRAMUSCULAR | 1 refills | Status: DC

## 2022-04-16 MED ORDER — ZOLPIDEM TARTRATE ER 12.5 MG PO TBCR: 12.5000 mg | EXTENDED_RELEASE_TABLET | Freq: Every evening | ORAL | 5 refills | Status: DC | PRN

## 2022-04-16 MED ORDER — "SYRINGE/NEEDLE (DISP) 25G X 1-1/2"" 1 ML MISC": 1.0000 | 1 refills | Status: DC

## 2022-04-16 NOTE — Progress Notes (Signed)
Chief Complaint  Patient presents with   Follow-up    6 mon, denies any pain or concerns   Annual Exam   Annual  1. Htn novasc 5 mg qd caused leg swelling/ankle she is spironolactone 50 mg qd 2. Obesity lost 110 lbs so far f/u Dr. Juleen China just started moujaro 12.5 mg qd her goal is 40 lbs less than 262  3. B12 def needs meds  4. Chronic insomnia on ambien 12.5 mg qd and helping   Review of Systems  Constitutional:  Negative for weight loss.  HENT:  Negative for hearing loss.   Eyes:  Negative for blurred vision.  Respiratory:  Negative for shortness of breath.   Cardiovascular:  Negative for chest pain.  Gastrointestinal:  Negative for abdominal pain and blood in stool.  Genitourinary:  Negative for dysuria.  Musculoskeletal:  Negative for falls and joint pain.  Skin:  Negative for rash.  Neurological:  Negative for headaches.  Psychiatric/Behavioral:  Negative for depression.   Past Medical History:  Diagnosis Date   Anemia    Anxiety    Depression    Fibroid    History of blood clots    Hypertension    Iron deficiency anemia    Right leg DVT ([facility])    Past Surgical History:  Procedure Laterality Date   GASTRIC BYPASS     2007   LAPAROSCOPIC TOTAL HYSTERECTOMY     removed cervix/uterus, removal of b/l tubes and ovary(s) fibroid 09/04/19 [facility] Dr. Delle Reining, Iona Coach MD; no need for future paps as of 11/22/19    Family History  Problem Relation Age of Onset   Hypertension Mother    Cancer Mother        brain   Hyperlipidemia Mother    Obesity Mother    Hyperlipidemia Father    Diabetes Maternal Grandmother    Social History   Socioeconomic History   Marital status: Married    Spouse name: Not on file   Number of children: Not on file   Years of education: Not on file   Highest education level: Not on file  Occupational History   Not on file  Tobacco Use   Smoking status: Never   Smokeless tobacco: Never  Substance and Sexual Activity   Alcohol  use: Not Currently   Drug use: Not Currently   Sexual activity: Yes    Comment: men  Other Topics Concern   Not on file  Social History Narrative   Married Cytogeneticist    2 kids 23 and 24 y.o as of 12/20/2018    Moved from [place] to [place] to [place]    Owns guns, wears seat belt, safe in relationship    3 pregnancies 2 live births       Work at [employer] in [employer].    Social Determinants of Health   Financial Resource Strain: Not on file  Food Insecurity: Not on file  Transportation Needs: Not on file  Physical Activity: Not on file  Stress: Not on file  Social Connections: Not on file  Intimate Partner Violence: Not on file   Current Meds  Medication Sig   buPROPion (WELLBUTRIN SR) 150 MG 12 hr tablet Take 1 tablet (150 mg total) by mouth daily.   DICLOFENAC SODIUM PO Take 60 mg by mouth 2 (two) times daily.    Insulin Pen Needle 32G X 4 MM MISC 1 each by Does not apply route once a week.   lisdexamfetamine (VYVANSE) 60  MG capsule Take 1 capsule (60 mg total) by mouth every morning.   spironolactone (ALDACTONE) 50 MG tablet Take 1 tablet (50 mg total) by mouth daily.   SYRINGE-NEEDLE, DISP, 3 ML 25G X 1" 3 ML MISC Q30 days Use to give b12 injections   Syringe/Needle, Disp, 25G X 1-1/2" 1 ML MISC 1 Device by Does not apply route every 30 (thirty) days.   tirzepatide (MOUNJARO) 10 MG/0.5ML Pen Inject 10 mg into the skin once a week.   tirzepatide (MOUNJARO) 12.5 MG/0.5ML Pen Inject 12.5 mg into the skin once a week.   Vitamin D, Ergocalciferol, (DRISDOL) 1.25 MG (50000 UNIT) CAPS capsule Take 1 capsule (50,000 Units total) by mouth every 7 (seven) days. D3   [DISCONTINUED] amLODipine (NORVASC) 5 MG tablet TAKE 1 TABLET(5 MG) BY MOUTH DAILY IN THE MORNING(STOP AMLODIPINE 2.'5MG'$ )   [DISCONTINUED] cyanocobalamin (,VITAMIN B-12,) 1000 MCG/ML injection Inject 1 mL (1,000 mcg total) into the muscle every 30 (thirty) days.   [DISCONTINUED] NEEDLE, DISP, 25 G 25G X 1-1/2" MISC 1 Device by Does not  apply route once a week.   [DISCONTINUED] zolpidem (AMBIEN CR) 12.5 MG CR tablet Take 1 tablet (12.5 mg total) by mouth at bedtime as needed for sleep. ambien 5 mg qhs is not helping   Allergies  Allergen Reactions   Norvasc [Amlodipine]     Leg swelling 5 mg dose   Trazodone And Nefazodone     Felt weird     No results found for this or any previous visit (from the past 2160 hour(s)). Objective  Body mass index is 38.2 kg/m. Wt Readings from Last 3 Encounters:  04/16/22 266 lb 3.2 oz (120.7 kg)  02/02/22 269 lb (122 kg)  12/25/21 279 lb (126.6 kg)   Temp Readings from Last 3 Encounters:  04/16/22 98.3 F (36.8 C) (Oral)  02/02/22 97.6 F (36.4 C) (Oral)  12/25/21 97.9 F (36.6 C) (Oral)   BP Readings from Last 3 Encounters:  04/16/22 124/84  02/02/22 131/84  12/25/21 129/84   Pulse Readings from Last 3 Encounters:  02/02/22 73  12/25/21 74  11/06/21 77    Physical Exam Vitals and nursing note reviewed.  Constitutional:      Appearance: Normal appearance. She is well-developed and well-groomed.  HENT:     Head: Normocephalic and atraumatic.  Eyes:     Conjunctiva/sclera: Conjunctivae normal.     Pupils: Pupils are equal, round, and reactive to light.  Cardiovascular:     Rate and Rhythm: Normal rate and regular rhythm.     Heart sounds: Normal heart sounds. No murmur heard. Pulmonary:     Effort: Pulmonary effort is normal.     Breath sounds: Normal breath sounds.  Abdominal:     General: Abdomen is flat. Bowel sounds are normal.     Tenderness: There is no abdominal tenderness.  Musculoskeletal:        General: No tenderness.  Skin:    General: Skin is warm and dry.  Neurological:     General: No focal deficit present.     Mental Status: She is alert and oriented to person, place, and time. Mental status is at baseline.     Cranial Nerves: Cranial nerves 2-12 are intact.     Motor: Motor function is intact.     Coordination: Coordination is intact.      Gait: Gait is intact.  Psychiatric:        Attention and Perception: Attention and perception normal.  Mood and Affect: Mood and affect normal.        Speech: Speech normal.        Behavior: Behavior normal. Behavior is cooperative.        Thought Content: Thought content normal.        Cognition and Memory: Cognition and memory normal.        Judgment: Judgment normal.    Assessment  Plan  Annual physical exam See below   Insomnia, unspecified type - Plan: zolpidem (AMBIEN CR) 12.5 MG CR tablet  B12 deficiency - Plan: cyanocobalamin (,VITAMIN B-12,) 1000 MCG/ML injection, Syringe/Needle, Disp, 25G X 1-1/2" 1 ML MISC, Vitamin B12  Hypertension, dbp sl elevated monitor for now - Plan: Comprehensive metabolic panel, CBC with Differential/Platelet, Lipid panel Spironolactone 50 mg qd  Stop norvasc 5 mg and monitor BP consider alternative to ccb in the future I.e ARB low dose benicar 5 mg qd   HM Flu shot utd  2/2 pfizer consider boosters Tdap thinks had in 2018 consider in future no record LMP 12/09/2018, Pap 2018 OB/GYN in [place] get records    Pap negative 10/11/17 neg pap neg HPV s/p hysterectomy -no need for further s/p hysterectomy 2020 [facility]    mammo 8/922 negative GIB ordered   Disc colonoscopy age 68  Rec healthy diet and exercise   Goal weight 220 lbs and pt interested in panniculectomy with Dr. Mingo Amber   Provider: Dr. Olivia Mackie McLean-Scocuzza-Internal Medicine

## 2022-04-16 NOTE — Patient Instructions (Signed)
Monitor blood pressure and let me know 05/01/22 via my chart

## 2022-05-02 ENCOUNTER — Other Ambulatory Visit: Payer: Self-pay | Admitting: Internal Medicine

## 2022-05-02 DIAGNOSIS — E559 Vitamin D deficiency, unspecified: Secondary | ICD-10-CM

## 2022-05-11 ENCOUNTER — Other Ambulatory Visit: Payer: Self-pay

## 2022-05-26 ENCOUNTER — Other Ambulatory Visit: Payer: Self-pay

## 2022-05-26 MED ORDER — MOUNJARO 12.5 MG/0.5ML ~~LOC~~ SOAJ
SUBCUTANEOUS | 0 refills | Status: DC
  Filled 2022-05-26: qty 2, 28d supply, fill #0

## 2022-05-27 ENCOUNTER — Other Ambulatory Visit: Payer: Self-pay

## 2022-05-27 MED ORDER — VYVANSE 60 MG PO CAPS: ORAL_CAPSULE | ORAL | 0 refills | Status: DC

## 2022-06-11 ENCOUNTER — Other Ambulatory Visit: Payer: Self-pay

## 2022-06-11 DIAGNOSIS — I10 Essential (primary) hypertension: Secondary | ICD-10-CM | POA: Diagnosis not present

## 2022-06-11 DIAGNOSIS — F5081 Binge eating disorder: Secondary | ICD-10-CM | POA: Diagnosis not present

## 2022-06-11 DIAGNOSIS — E118 Type 2 diabetes mellitus with unspecified complications: Secondary | ICD-10-CM | POA: Diagnosis not present

## 2022-06-11 DIAGNOSIS — Z6836 Body mass index (BMI) 36.0-36.9, adult: Secondary | ICD-10-CM | POA: Diagnosis not present

## 2022-06-11 MED ORDER — MOUNJARO 12.5 MG/0.5ML ~~LOC~~ SOAJ: SUBCUTANEOUS | 0 refills | Status: DC

## 2022-06-16 ENCOUNTER — Other Ambulatory Visit: Payer: Self-pay

## 2022-06-16 MED ORDER — VYVANSE 60 MG PO CAPS: ORAL_CAPSULE | ORAL | 0 refills | Status: DC

## 2022-06-24 ENCOUNTER — Other Ambulatory Visit: Payer: Self-pay

## 2022-06-24 ENCOUNTER — Encounter (INDEPENDENT_AMBULATORY_CARE_PROVIDER_SITE_OTHER): Payer: Self-pay

## 2022-06-24 MED ORDER — MOUNJARO 12.5 MG/0.5ML ~~LOC~~ SOAJ
SUBCUTANEOUS | 0 refills | Status: DC
  Filled 2022-06-24: qty 2, 28d supply, fill #0

## 2022-07-09 DIAGNOSIS — E118 Type 2 diabetes mellitus with unspecified complications: Secondary | ICD-10-CM | POA: Diagnosis not present

## 2022-07-09 DIAGNOSIS — I10 Essential (primary) hypertension: Secondary | ICD-10-CM | POA: Diagnosis not present

## 2022-07-09 DIAGNOSIS — F5081 Binge eating disorder: Secondary | ICD-10-CM | POA: Diagnosis not present

## 2022-07-09 DIAGNOSIS — Z9189 Other specified personal risk factors, not elsewhere classified: Secondary | ICD-10-CM | POA: Diagnosis not present

## 2022-07-10 DIAGNOSIS — M25461 Effusion, right knee: Secondary | ICD-10-CM | POA: Diagnosis not present

## 2022-07-10 DIAGNOSIS — G8929 Other chronic pain: Secondary | ICD-10-CM | POA: Diagnosis not present

## 2022-07-10 DIAGNOSIS — M7121 Synovial cyst of popliteal space [Baker], right knee: Secondary | ICD-10-CM | POA: Diagnosis not present

## 2022-07-10 DIAGNOSIS — M25561 Pain in right knee: Secondary | ICD-10-CM | POA: Diagnosis not present

## 2022-07-10 DIAGNOSIS — M1711 Unilateral primary osteoarthritis, right knee: Secondary | ICD-10-CM | POA: Diagnosis not present

## 2022-07-28 ENCOUNTER — Ambulatory Visit: Payer: BC Managed Care – PPO | Admitting: Podiatry

## 2022-07-28 ENCOUNTER — Ambulatory Visit (INDEPENDENT_AMBULATORY_CARE_PROVIDER_SITE_OTHER): Payer: BC Managed Care – PPO

## 2022-07-28 DIAGNOSIS — M2011 Hallux valgus (acquired), right foot: Secondary | ICD-10-CM | POA: Diagnosis not present

## 2022-07-28 DIAGNOSIS — Z79899 Other long term (current) drug therapy: Secondary | ICD-10-CM | POA: Diagnosis not present

## 2022-07-28 MED ORDER — CLOTRIMAZOLE-BETAMETHASONE 1-0.05 % EX CREA: 1.0000 | TOPICAL_CREAM | Freq: Two times a day (BID) | CUTANEOUS | 1 refills | Status: DC

## 2022-07-28 MED ORDER — TERBINAFINE HCL 250 MG PO TABS: 250.0000 mg | ORAL_TABLET | Freq: Every day | ORAL | 0 refills | Status: DC

## 2022-07-28 NOTE — Progress Notes (Addendum)
Chief Complaint  Patient presents with   Nail Problem    Right foot swelling and toe nail fungus, swelling has been off and on for years.    Subjective: 46 y.o. female presents today as a new patient for evaluation of 2 separate complaints.  First the patient states that she went to a pedicure salon earlier this year and developed thick discoloration to the toenails of the right foot.  It is very symptomatic and they are discolored and unsightly.  She would like to have them evaluated and treated.  Secondly, the patient states that she is suffering from a bunion to the right foot.  She has had the bunion slowly progressing over the past few years and it is increasingly becoming painful despite wearing different shoe gear modifications to try to alleviate the pain.  She says that she has a sharp bump to the bunion area that is very aggravating and debilitating on a daily basis despite shoe gear modifications.  Past Medical History:  Diagnosis Date   Anemia    Anxiety    Depression    Fibroid    History of blood clots    Hypertension    Iron deficiency anemia    Right leg DVT ([place])     Past Surgical History:  Procedure Laterality Date   GASTRIC BYPASS     2007   LAPAROSCOPIC TOTAL HYSTERECTOMY     removed cervix/uterus, removal of b/l tubes and ovary(s) fibroid 09/04/19 [facility] Dr. Delle Reining, Iona Coach MD; no need for future paps as of 11/22/19     Allergies  Allergen Reactions   Norvasc [Amlodipine]     Leg swelling 5 mg dose   Trazodone And Nefazodone     Felt weird     Current Outpatient Medications on File Prior to Visit  Medication Sig Dispense Refill   buPROPion (WELLBUTRIN SR) 150 MG 12 hr tablet Take 1 tablet (150 mg total) by mouth daily. 90 tablet 0   cyanocobalamin (,VITAMIN B-12,) 1000 MCG/ML injection Inject 1 mL (1,000 mcg total) into the muscle every 30 (thirty) days. 12 mL 1   DICLOFENAC SODIUM PO Take 60 mg by mouth 2 (two) times daily.      Insulin  Pen Needle 32G X 4 MM MISC 1 each by Does not apply route once a week. 50 each 0   lisdexamfetamine (VYVANSE) 60 MG capsule Take 1 capsule (60 mg total) by mouth every morning. 30 capsule 0   lisdexamfetamine (VYVANSE) 60 MG capsule Take 1 capsule by mouth once daily in the morning. 30 days 30 capsule 0   lisdexamfetamine (VYVANSE) 60 MG capsule Take 1 capsule by mouth once daily in the morning. 30 days 30 capsule 0   spironolactone (ALDACTONE) 50 MG tablet Take 1 tablet (50 mg total) by mouth daily. 90 tablet 3   SYRINGE-NEEDLE, DISP, 3 ML 25G X 1" 3 ML MISC Q30 days Use to give b12 injections 50 each 0   Syringe/Needle, Disp, 25G X 1-1/2" 1 ML MISC 1 Device by Does not apply route every 30 (thirty) days. 12 each 1   tirzepatide (MOUNJARO) 10 MG/0.5ML Pen Inject 10 mg into the skin once a week. 2 mL 0   tirzepatide (MOUNJARO) 12.5 MG/0.5ML Pen Inject 12.5 mg into the skin once a week. 6 mL 2   tirzepatide (MOUNJARO) 12.5 MG/0.5ML Pen Inject 12.'5mg'$  (0.14m) under the skin once weekly. 90 days 6 mL 0   tirzepatide (MOUNJARO) 12.5 MG/0.5ML Pen Inject  12.'5mg'$  (0.30m) under the skin once weekly. 90 days 6 mL 0   tirzepatide (MOUNJARO) 12.5 MG/0.5ML Pen Inject 12.'5mg'$  (0.525m under the skin once weekly. 90 days 6 mL 0   Vitamin D, Ergocalciferol, (DRISDOL) 1.25 MG (50000 UNIT) CAPS capsule TAKE 1 CAPSULE BY MOUTH EVERY 7 DAYS 13 capsule 1   zolpidem (AMBIEN CR) 12.5 MG CR tablet Take 1 tablet (12.5 mg total) by mouth at bedtime as needed for sleep. ambien 5 mg qhs is not helping 30 tablet 5   No current facility-administered medications on file prior to visit.    Objective: Physical Exam General: The patient is alert and oriented x3 in no acute distress.  Dermatology: Hyperkeratotic dystrophic nails noted to the right foot especially the right great toe and third digit.  Findings consistent with onychomycosis/fungal nail infection  Vascular: Palpable pedal pulses bilaterally. No edema or erythema  noted. Capillary refill within normal limits.  Neurological: Epicritic and protective threshold grossly intact bilaterally.   Musculoskeletal Exam: Clinical evidence of bunion deformity noted to the respective foot. There is pain on palpation range of motion of the first MPJ. Lateral deviation of the hallux noted consistent with hallux abductovalgus.  Radiographic Exam: Normal osseous mineralization.  No acute fractures identified.  Increased intermetatarsal angle greater than 15 degrees with a hallux abductus angle greater than 30 degrees and a prominent hypertrophic medial eminence of the first metatarsal head on AP view and medial oblique view.   Assessment: 1.  Symptomatic hallux valgus right 2.  Onychomycosis of toenails right foot   Plan of Care:  1. Patient was evaluated. X-Rays reviewed. 2.  Patient has history of DVTs/blood clots to the right lower extremity with chronic right lower extremity edema.  Risks associated with surgery and blood clots were explained in detail to the patient.  The patient understands risks and benefits of surgery.  She says that the bunion is very debilitating on a daily basis and depressing because she is unable to maintain a good quality of life style.  She would like to pursue the surgical option of bunionectomy to the right foot.  Risk benefits advantages and disadvantages were explained as well as the postoperative recovery course.  3.  Authorization for surgery was initiated today.  Surgery will consist of bunionectomy with osteotomy right foot.  To minimize blood clot/DVT she will need 10 days postop Lovenox injections 4.  Medical clearance from PCP 5.  In regards to the fungal nail infections, different treatment options were discussed including oral, topical, and laser antifungal treatment modalities.  The patient would like to pursue both oral and topical treatment.  She has topical antifungal at home.   6.  Prior to oral medication she will need  updated hepatic function panel.  Hepatic function panel ordered.  If her hepatic function levels are elevated I would recommend laser antifungal treatment [place] our [place].  Return to clinic 1 week postop   BrEdrick KinsDPM [facility]  Dr. BrEdrick KinsDPM    [address] [address]              Office [phone] ([phone]

## 2022-07-29 LAB — HEPATIC FUNCTION PANEL
ALT: 45 IU/L — ABNORMAL HIGH (ref 0–32)
AST: 35 IU/L (ref 0–40)
Albumin: 4 g/dL (ref 3.9–4.9)
Alkaline Phosphatase: 109 IU/L (ref 44–121)
Bilirubin Total: 0.6 mg/dL (ref 0.0–1.2)
Bilirubin, Direct: 0.11 mg/dL (ref 0.00–0.40)
Total Protein: 6.4 g/dL (ref 6.0–8.5)

## 2022-07-31 ENCOUNTER — Telehealth: Payer: Self-pay | Admitting: Internal Medicine

## 2022-07-31 NOTE — Progress Notes (Signed)
Dr. Amalia Hailey   She is medically cleared for surgery.  I agree with Lovenox.  There are no medications she needs to stop.   Dr. Olivia Mackie McLean-Scocuzza

## 2022-07-31 NOTE — Telephone Encounter (Signed)
RE letter   Dr. Amalia Hailey   The below patient is medically cleared. I agree with Lovenox and no medications need to be stopped prior to surgery.   Dr. Olivia Mackie McLean-Scocuzza   [facility] [address], [address], [place]  [address] Phone:  [phone]   Fax:  [phone]   July 31, 2022     Re: Medical clearance for surgery on 08/20/2022     Dear Dr. Olivia Mackie McLean-Scocuzza,     Alyssa Cain, DOB 1976-04-16, is a mutual patient.  I am treating her for symptomatic hallux valgus right.  She needs surgery which would be performed at an outpatient ambulatory surgical center.  She would receive general anesthetic.  Surgery will consist of bunionectomy with osteotomy right foot.  To minimize blood clot/DVT she will need 10 days postop Lovenox injections.  I would like written medical clearance.  Are there any contra-indications?  Does the patient need to stop taking any medications prior to her surgery?  If so, please give the instructions.  You can respond via Epic in-basket or fax to [phone].     Sincerely,       Daylene Katayama, DPM

## 2022-08-03 ENCOUNTER — Telehealth: Payer: Self-pay

## 2022-08-03 NOTE — Telephone Encounter (Signed)
Received surgery clearance from Dr. Olivia Mackie McLean-Scocuzza. I spoke to [place] and informed her to stop Lovenox 10 days prior to surgery. She stated she understood this.

## 2022-08-12 ENCOUNTER — Telehealth: Payer: Self-pay | Admitting: Urology

## 2022-08-12 NOTE — Telephone Encounter (Signed)
DOS - 08/20/22  [place] BUNIONECTOMY RIGHT --- [phone]  SPOKE WITH CHRISTOPHER WITH [facility] AND HE STATED THAT CPT CODE 29798 HAS BEEN APPROVED, AUTH # [id number], GOOD FROM 08/20/22 - 10/18/22.  REF # [id number]

## 2022-08-20 ENCOUNTER — Other Ambulatory Visit: Payer: Self-pay | Admitting: Podiatry

## 2022-08-20 DIAGNOSIS — G8918 Other acute postprocedural pain: Secondary | ICD-10-CM | POA: Diagnosis not present

## 2022-08-20 DIAGNOSIS — M2011 Hallux valgus (acquired), right foot: Secondary | ICD-10-CM | POA: Diagnosis not present

## 2022-08-20 DIAGNOSIS — M21611 Bunion of right foot: Secondary | ICD-10-CM | POA: Diagnosis not present

## 2022-08-20 DIAGNOSIS — M2031 Hallux varus (acquired), right foot: Secondary | ICD-10-CM | POA: Diagnosis not present

## 2022-08-20 MED ORDER — ENOXAPARIN SODIUM 40 MG/0.4ML IJ SOSY: 40.0000 mg | PREFILLED_SYRINGE | INTRAMUSCULAR | 0 refills | Status: DC

## 2022-08-20 MED ORDER — OXYCODONE-ACETAMINOPHEN 5-325 MG PO TABS: 1.0000 | ORAL_TABLET | ORAL | 0 refills | Status: DC | PRN

## 2022-08-20 NOTE — Progress Notes (Signed)
PRN postop 

## 2022-08-28 ENCOUNTER — Ambulatory Visit (INDEPENDENT_AMBULATORY_CARE_PROVIDER_SITE_OTHER): Payer: BC Managed Care – PPO

## 2022-08-28 ENCOUNTER — Ambulatory Visit (INDEPENDENT_AMBULATORY_CARE_PROVIDER_SITE_OTHER): Payer: BC Managed Care – PPO | Admitting: Podiatry

## 2022-08-28 DIAGNOSIS — Z9889 Other specified postprocedural states: Secondary | ICD-10-CM | POA: Diagnosis not present

## 2022-08-28 MED ORDER — OXYCODONE-ACETAMINOPHEN 5-325 MG PO TABS: 1.0000 | ORAL_TABLET | ORAL | 0 refills | Status: DC | PRN

## 2022-08-28 MED ORDER — TERBINAFINE HCL 250 MG PO TABS
250.0000 mg | ORAL_TABLET | Freq: Every day | ORAL | 0 refills | Status: DC
Start: 2022-08-28 — End: 2022-10-13

## 2022-08-28 NOTE — Progress Notes (Unsigned)
   Chief Complaint  Patient presents with   Post-op Follow-up    POV #1 DOS 08/20/2022 BUNIONECTOMY W/OSTEOTOMY RT, patient is taking pain medication and needs refill.    Subjective:  Patient presents today status post bunionectomy with osteotomy right. DOS: 08/20/2022.  Patient states that she is doing well.  She continues to take the Lovenox injections as prescribed.  She has about 3 days left.  She has been minimally weightbearing in the cam boot. Patient is also requesting a refill of the Lamisil that was prescribed previously.  She says that the pharmacy only dispensed 30 although the prescription was for 90 pills.  This is to address her onychomycosis and fungal nail infection  Past Medical History:  Diagnosis Date   Anemia    Anxiety    Depression    Fibroid    History of blood clots    Hypertension    Iron deficiency anemia    Right leg DVT [facility])     Past Surgical History:  Procedure Laterality Date   GASTRIC BYPASS     2007   LAPAROSCOPIC TOTAL HYSTERECTOMY     removed cervix/uterus, removal of b/l tubes and ovary(s) fibroid 09/04/19 [facility] Dr. Delle Reining, Iona Coach MD; no need for future paps as of 11/22/19     Allergies  Allergen Reactions   Norvasc [Amlodipine]     Leg swelling 5 mg dose   Trazodone And Nefazodone     Felt weird      Objective/Physical Exam Neurovascular status intact.  Skin incisions appear to be well coapted with sutures and staples intact. No sign of infectious process noted. No dehiscence. No active bleeding noted. Moderate edema noted to the surgical extremity.  Radiographic Exam RT foot 08/28/2022:  Orthopedic hardware and osteotomies sites appear to be stable with routine healing.  Overall good realignment of the first ray  Assessment: 1. s/p bunionectomy with osteotomy RT foot. DOS: 08/20/2022 2.  Onychomycosis of toenails right foot   Plan of Care:  1. Patient was evaluated. X-rays reviewed 2.  Refill prescription for Lamisil  250 mg #30.  Updated LFT in 1 month prior to final refill 3.  Dressings changed RT foot.  Clean dry and intact x1 week 4.  Continue minimal weightbearing in the cam boot 5.  Continue the Lovenox injections until completed 6.  Refill Percocet 5/325 mg every 6 hours as needed pain  7.  Return to clinic 1 week    Edrick Kins, DPM [facility]  Dr. Edrick Kins, DPM    [address], [place] [address]                Office [phone]  Fax [phone]

## 2022-08-30 IMAGING — CT CT ANGIO HEAD
2 of 11 series · 4 of 33 positions shown · IV contrast (iopamidol)
Comparison: None.

CLINICAL DATA: Pulsatile tinnitus

EXAM:
CT ANGIOGRAPHY HEAD AND NECK
TECHNIQUE: Multidetector CT imaging of the head and neck was performed using
the standard protocol during bolus administration of intravenous
contrast. Multiplanar CT image reconstructions and MIPs were
obtained to evaluate the vascular anatomy. Carotid stenosis
measurements (when applicable) are obtained utilizing NASCET
criteria, using the distal internal carotid diameter as the
denominator.
CONTRAST:  75mL OCEEP6-NEB IOPAMIDOL (OCEEP6-NEB) INJECTION 76%

[Series 13: brain 3.00 hr40 s3 sag without ibhc · sagittal · non-contrast · 0.32mm/px · 1 of 57 slices shown]
[im 29/57  soft-tissue]
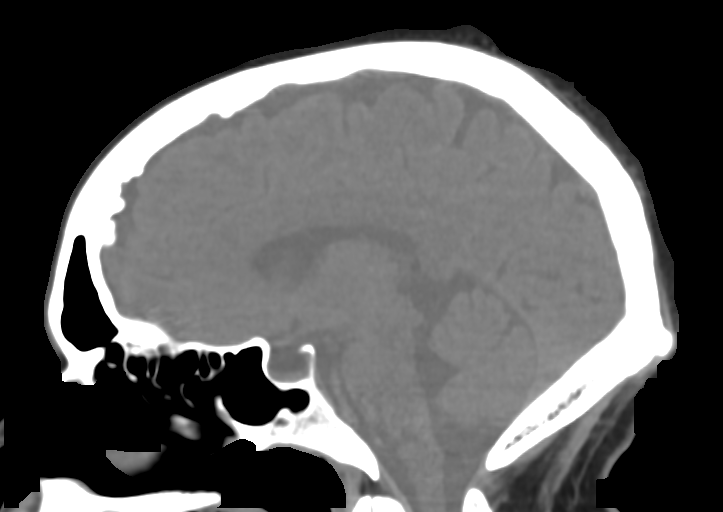

[Series 16: cta head & neck 1.00 hv48 s3 ax thin mips · axial · 0.41mm/px · z∈[-820,-474]mm · 3 of 347 slices shown]
[im 1/347  soft-tissue]
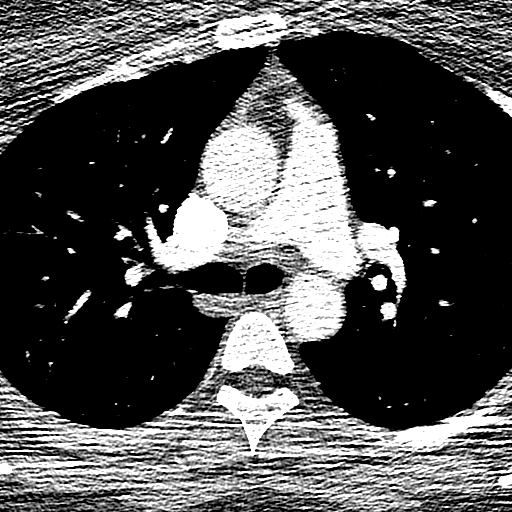
[im 174/347  bone]
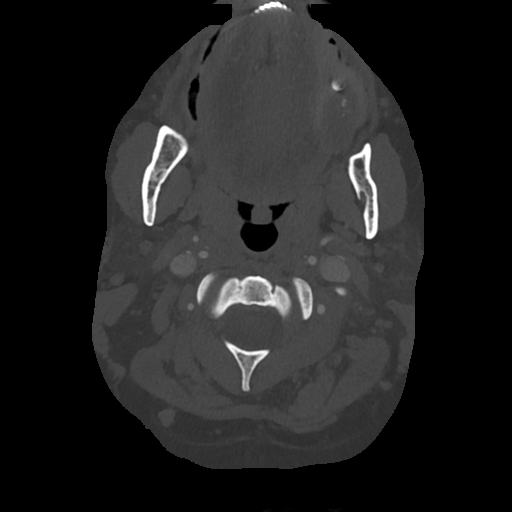
[im 347/347  soft-tissue]
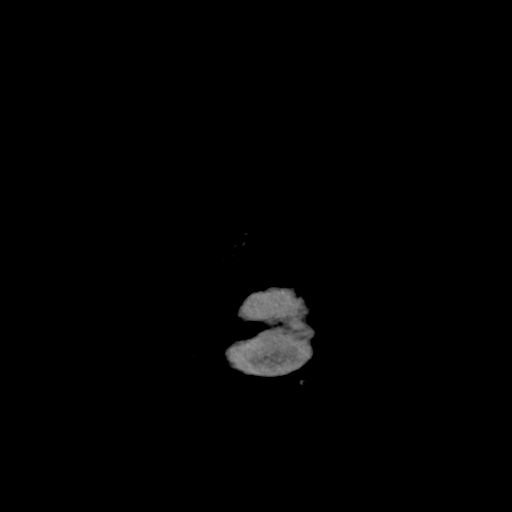

[4 of 33 positions shown; findings below may reference images not displayed]

FINDINGS: CT HEAD

Brain: There is no acute intracranial hemorrhage, mass effect, or
edema. Gray-white differentiation is preserved. There is no
extra-axial fluid collection. Ventricles and sulci are within normal
limits in size and configuration.

Vascular: There is atherosclerotic calcification at the skull base.

Skull: Calvarium is unremarkable.

Sinuses/Orbits: No acute finding.

Other: Incidental note is made of a partially empty sella.

Review of the MIP images confirms the above findings

CTA NECK

There is streak artifact through inferior slices due to body
habitus.

Aortic arch: Great vessel origins are patent but partially obscured.

Right carotid system: Patent. Common carotid is partially obscured.
There is no measurable stenosis at the ICA origin.

Left carotid system: Patent. Common carotid is partially obscured.
There is no measurable stenosis at the ICA origin.

Vertebral arteries: Patent.  Proximal portions are obscured.

Skeleton: Cervical spine degenerative changes.

Other neck: No mass or adenopathy.

Upper chest: Included upper lungs are clear.

Review of the MIP images confirms the above findings

CTA HEAD

Anterior circulation: Intracranial internal carotid arteries are
patent. Anterior and middle cerebral arteries are patent.

Posterior circulation: Intracranial vertebral arteries, basilar
artery, and posterior cerebral arteries are patent. Bilateral
posterior communicating arteries are present.

Venous sinuses: Patent as allowed by contrast bolus timing.

Review of the MIP images confirms the above findings
IMPRESSION: No acute intracranial abnormality.

No significant vascular abnormality or findings to account for
reported symptoms.

## 2022-08-30 IMAGING — CR DG KNEE COMPLETE 4+V*L*
4 series · 4 of 4 positions shown · non-contrast
Comparison: None.

CLINICAL DATA: left knee pain

EXAM:
LEFT KNEE - COMPLETE 4+ VIEW

[t knee ap left]
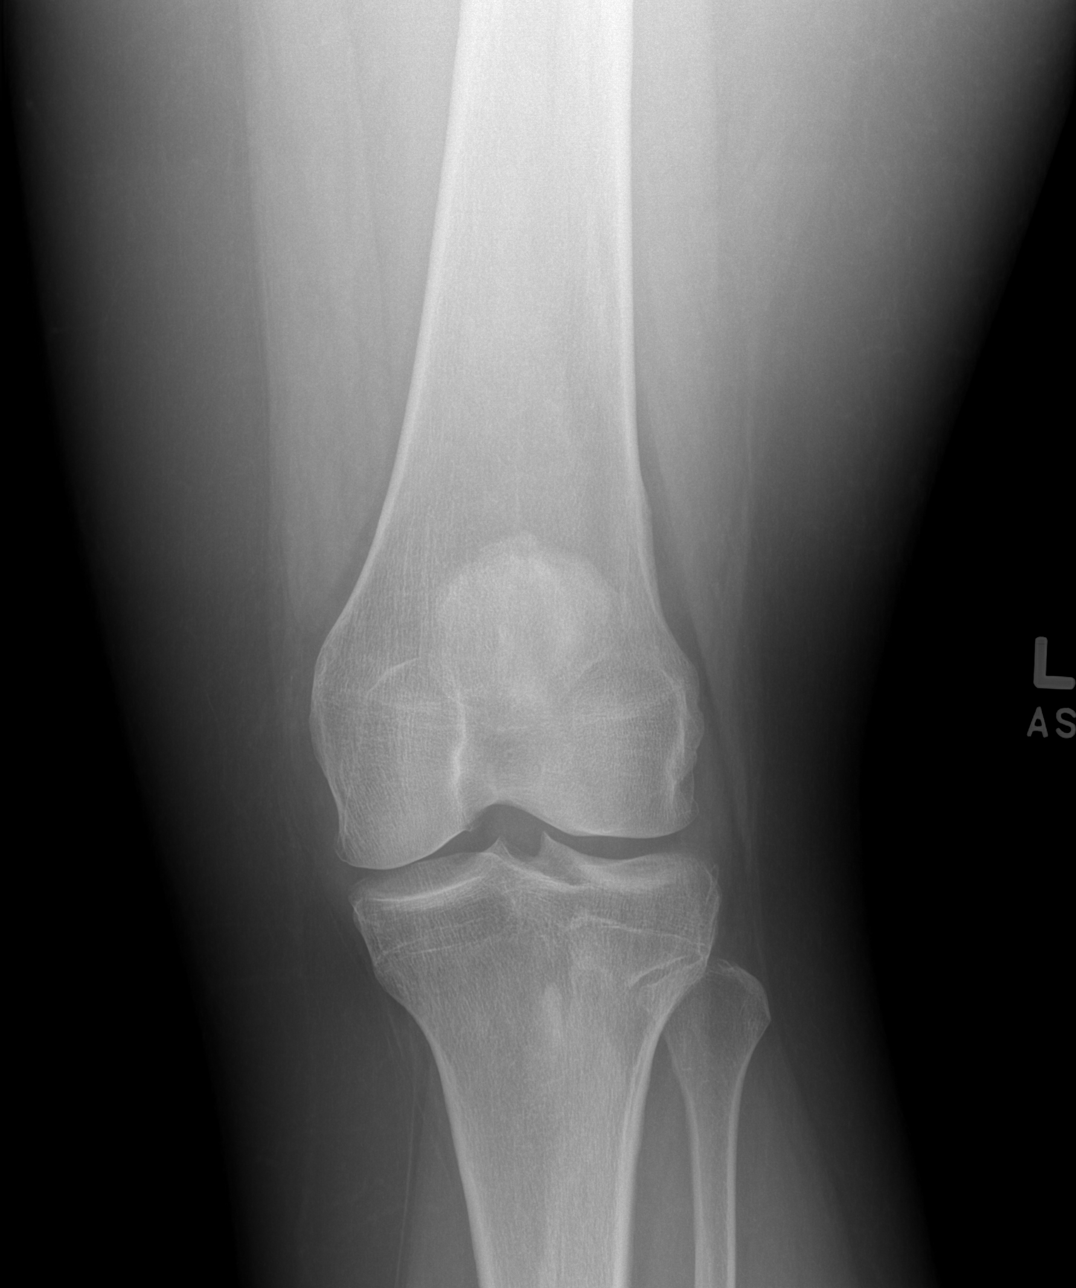

[t knee oblique left (1 of 2)]
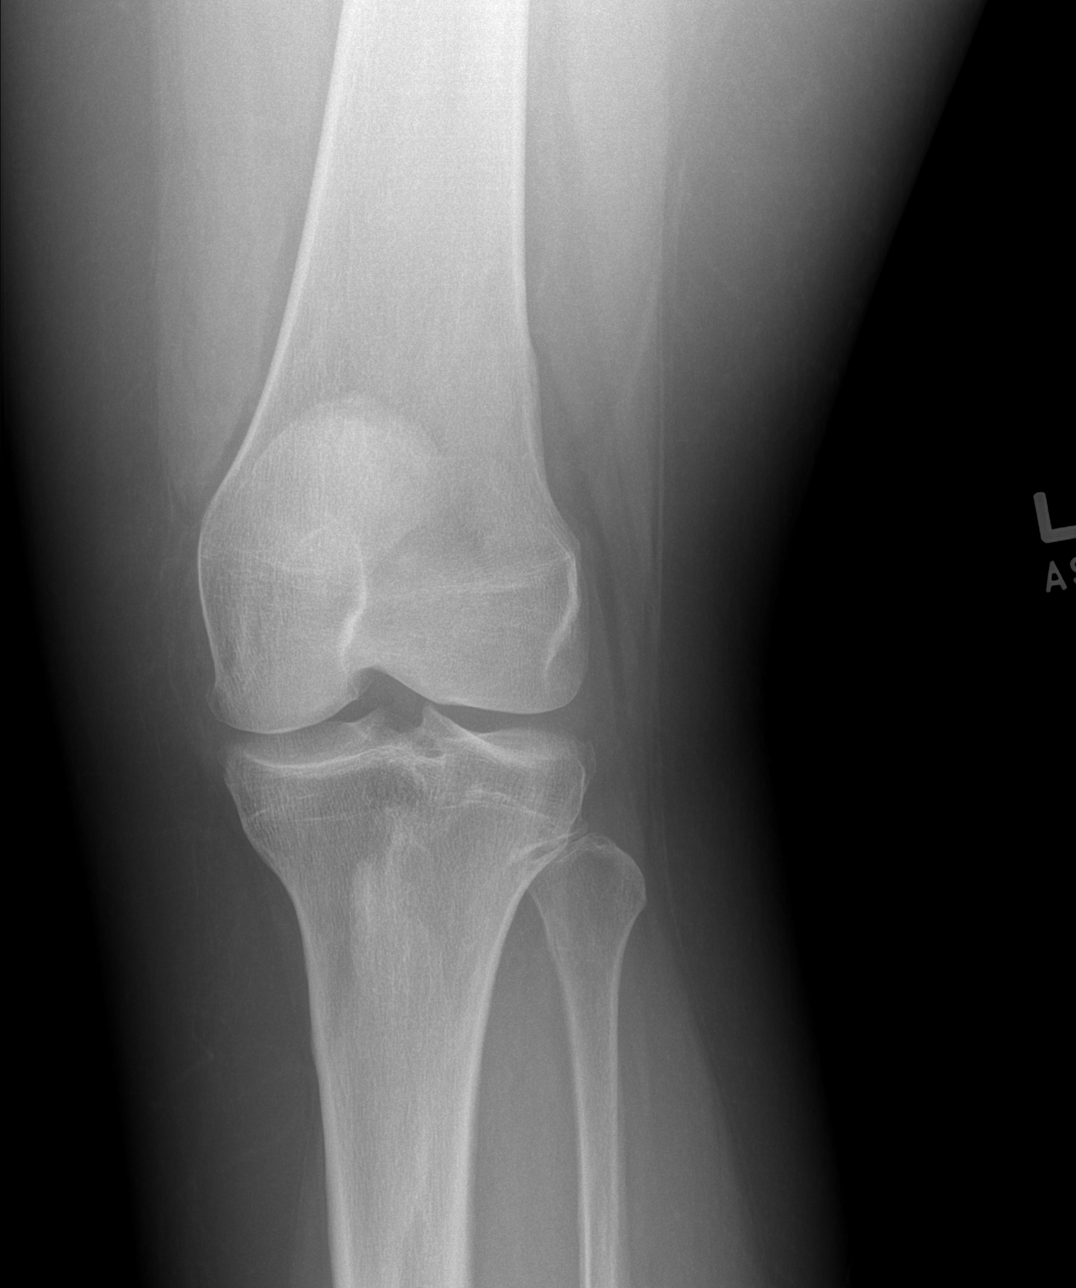

[t knee oblique left (2 of 2)]
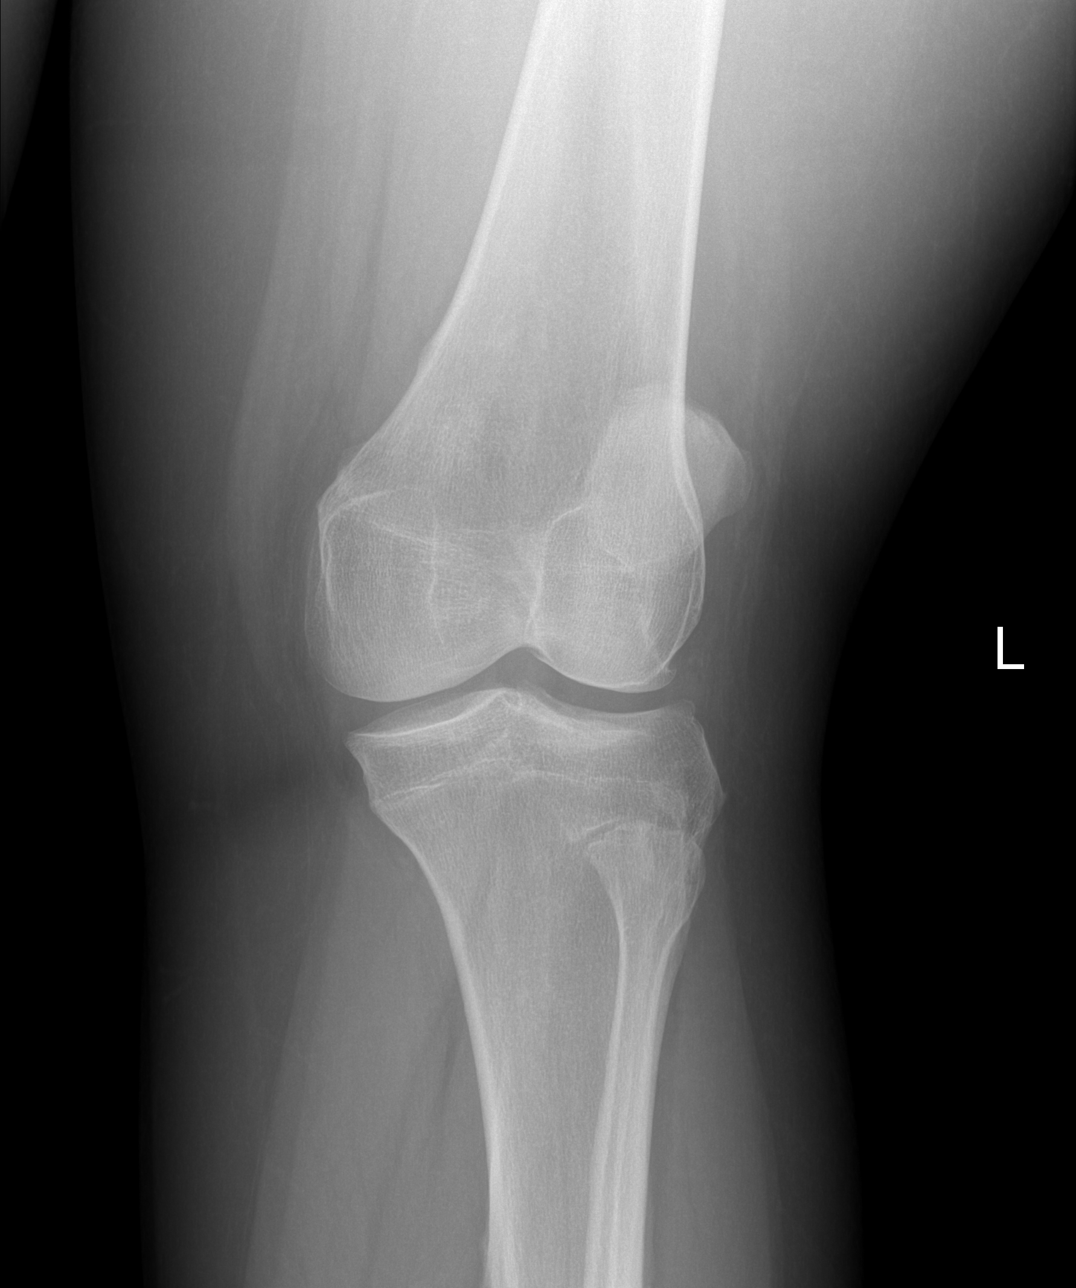

[t knee lat left]
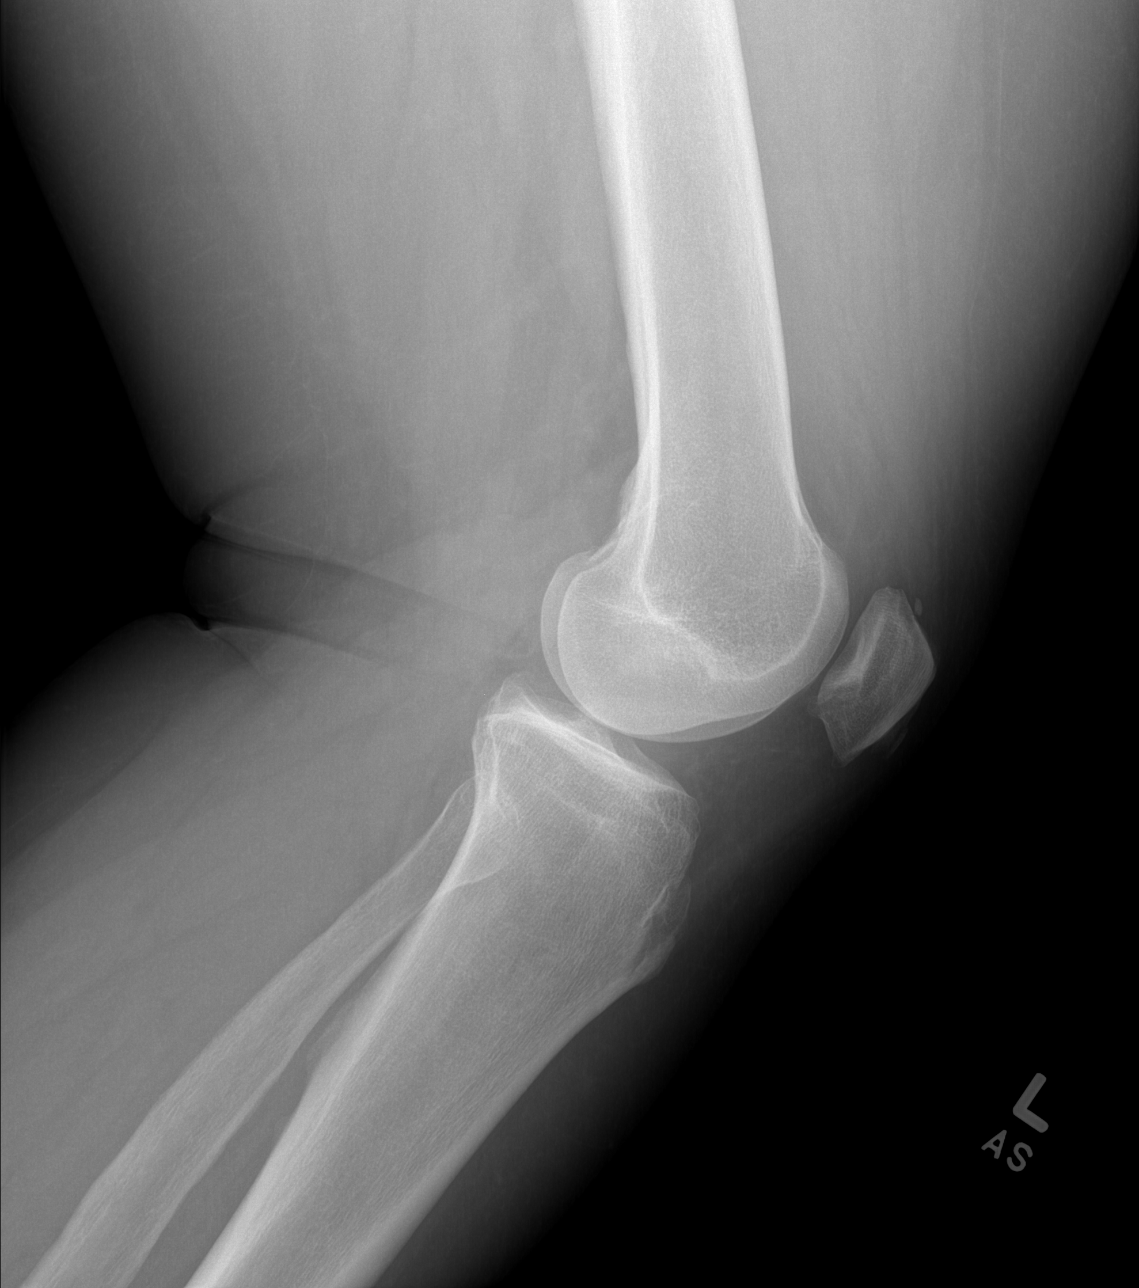

[4 of 4 positions shown; findings below may reference images not displayed]

FINDINGS: Physiologic alignment. No fracture or focal osseous lesion. Minimal
lateral compartment and tibial eminence degenerative spurring. Soft
tissues are within normal limits.
IMPRESSION: No acute osseous abnormality.  Minimal degenerative changes.

## 2022-09-04 ENCOUNTER — Ambulatory Visit (INDEPENDENT_AMBULATORY_CARE_PROVIDER_SITE_OTHER): Payer: BC Managed Care – PPO | Admitting: Podiatry

## 2022-09-04 DIAGNOSIS — Z9889 Other specified postprocedural states: Secondary | ICD-10-CM

## 2022-09-04 MED ORDER — OXYCODONE-ACETAMINOPHEN 5-325 MG PO TABS: 1.0000 | ORAL_TABLET | Freq: Four times a day (QID) | ORAL | 0 refills | Status: DC | PRN

## 2022-09-04 NOTE — Progress Notes (Signed)
   Chief Complaint  Patient presents with   Post-op Follow-up    Patient is here for #2 post visit right foot.patient states that she is still having some pain, not taking anything for pain.    Subjective:  Patient presents today status post bunionectomy with osteotomy right. DOS: 08/20/2022.  Patient states that she is doing well.  She continues to have some pain and tenderness associated to the foot.  She has been minimally weightbearing in the cam boot as instructed.  No new complaints at this time  Past Medical History:  Diagnosis Date   Anemia    Anxiety    Depression    Fibroid    History of blood clots    Hypertension    Iron deficiency anemia    Right leg DVT [facility])     Past Surgical History:  Procedure Laterality Date   GASTRIC BYPASS     2007   LAPAROSCOPIC TOTAL HYSTERECTOMY     removed cervix/uterus, removal of b/l tubes and ovary(s) fibroid 09/04/19 [facility] Dr. Delle Reining, Iona Coach MD; no need for future paps as of 11/22/19     Allergies  Allergen Reactions   Norvasc [Amlodipine]     Leg swelling 5 mg dose   Trazodone And Nefazodone     Felt weird      Objective/Physical Exam Neurovascular status intact.  Skin incisions appear to be well coapted with sutures and staples intact. No sign of infectious process noted. No dehiscence. No active bleeding noted. Moderate edema noted to the surgical extremity.  Radiographic Exam RT foot 08/28/2022:  Orthopedic hardware and osteotomies sites appear to be stable with routine healing.  Overall good realignment of the first ray  Assessment: 1. s/p bunionectomy with osteotomy RT foot. DOS: 08/20/2022 2.  Onychomycosis of toenails right foot   Plan of Care:  1. Patient was evaluated.  2.  Continue Lamisil 250 mg #30.  Updated LFT in 1 month prior to final refill 3.  Patient may begin washing and showering and getting the foot wet.  Continue WBAT in the cam boot 4.  Refill prescription for Percocet 5/325 mg as  needed 5.  Return to clinic in 2 weeks for follow-up x-ray   Edrick Kins, DPM [facility]  Dr. Edrick Kins, DPM    [address], [place] [address]                Office [phone]  Fax [phone]

## 2022-09-07 DIAGNOSIS — M67431 Ganglion, right wrist: Secondary | ICD-10-CM | POA: Diagnosis not present

## 2022-09-07 DIAGNOSIS — M25531 Pain in right wrist: Secondary | ICD-10-CM | POA: Diagnosis not present

## 2022-09-18 ENCOUNTER — Ambulatory Visit (INDEPENDENT_AMBULATORY_CARE_PROVIDER_SITE_OTHER): Payer: BC Managed Care – PPO | Admitting: Podiatry

## 2022-09-18 ENCOUNTER — Other Ambulatory Visit: Payer: Self-pay | Admitting: Podiatry

## 2022-09-18 ENCOUNTER — Encounter: Payer: Self-pay | Admitting: Podiatry

## 2022-09-18 ENCOUNTER — Ambulatory Visit (INDEPENDENT_AMBULATORY_CARE_PROVIDER_SITE_OTHER): Payer: BC Managed Care – PPO

## 2022-09-18 DIAGNOSIS — M2011 Hallux valgus (acquired), right foot: Secondary | ICD-10-CM | POA: Diagnosis not present

## 2022-09-18 DIAGNOSIS — Z9889 Other specified postprocedural states: Secondary | ICD-10-CM

## 2022-09-18 NOTE — Progress Notes (Unsigned)
   Chief Complaint  Patient presents with   Routine Post Op    POV #3 DOS 08/20/2022 BUNIONECTOMY W/OSTEOTOMY RT  "Its feeling pretty good"    Subjective:  Patient presents today status post bunionectomy with osteotomy right. DOS: 08/20/2022.  Patient states that she is doing well.  Overall improvement.  She is weightbearing as tolerated in the cam boot. Past Medical History:  Diagnosis Date   Anemia    Anxiety    Depression    Fibroid    History of blood clots    Hypertension    Iron deficiency anemia    Right leg DVT [facility])     Past Surgical History:  Procedure Laterality Date   GASTRIC BYPASS     2007   LAPAROSCOPIC TOTAL HYSTERECTOMY     removed cervix/uterus, removal of b/l tubes and ovary(s) fibroid 09/04/19 [facility] Dr. Delle Reining, Iona Coach MD; no need for future paps as of 11/22/19     Allergies  Allergen Reactions   Amlodipine Other (See Comments)    Leg swelling 5 mg dose   Trazodone And Nefazodone     Felt weird      Objective/Physical Exam Neurovascular status intact.  Skin incisions healed. No sign of infectious process noted. No dehiscence.  Minimal edema noted to the surgical extremity.  Good range of motion of the first MTP joint  Radiographic Exam RT foot 08/28/2022:  Orthopedic hardware and osteotomies sites appear to be stable with routine healing.  Overall good realignment of the first ray  Assessment: 1. s/p bunionectomy with osteotomy RT foot. DOS: 08/20/2022 2.  Onychomycosis of toenails right foot   Plan of Care:  1. Patient was evaluated.  2.  Compression ankle sleeve dispensed.  Wear daily 3.  Transition out of the cam boot in 2 weeks into good supportive shoes and sneakers 4.  Continue Lamisil as prescribed x90 days  5.  Return to clinic 4 weeks  Edrick Kins, DPM [facility]  Dr. Edrick Kins, DPM    [address], [place] [address]                Office [phone]   Fax [phone]

## 2022-09-21 DIAGNOSIS — M67431 Ganglion, right wrist: Secondary | ICD-10-CM | POA: Diagnosis not present

## 2022-09-28 ENCOUNTER — Telehealth: Payer: Self-pay

## 2022-09-28 NOTE — Telephone Encounter (Signed)
Have updated

## 2022-10-05 ENCOUNTER — Other Ambulatory Visit: Payer: Self-pay | Admitting: Family

## 2022-10-05 ENCOUNTER — Telehealth: Payer: Self-pay | Admitting: Internal Medicine

## 2022-10-05 DIAGNOSIS — G4700 Insomnia, unspecified: Secondary | ICD-10-CM

## 2022-10-05 MED ORDER — ZOLPIDEM TARTRATE ER 12.5 MG PO TBCR: 12.5000 mg | EXTENDED_RELEASE_TABLET | Freq: Every evening | ORAL | 5 refills | Status: DC | PRN

## 2022-10-05 NOTE — Telephone Encounter (Signed)
Pt need a refill on zolpidem sent to [facility]

## 2022-10-13 ENCOUNTER — Ambulatory Visit (INDEPENDENT_AMBULATORY_CARE_PROVIDER_SITE_OTHER): Payer: BC Managed Care – PPO

## 2022-10-13 ENCOUNTER — Ambulatory Visit (INDEPENDENT_AMBULATORY_CARE_PROVIDER_SITE_OTHER): Payer: BC Managed Care – PPO | Admitting: Podiatry

## 2022-10-13 DIAGNOSIS — Z9889 Other specified postprocedural states: Secondary | ICD-10-CM | POA: Diagnosis not present

## 2022-10-13 MED ORDER — TERBINAFINE HCL 250 MG PO TABS: 250.0000 mg | ORAL_TABLET | Freq: Every day | ORAL | 0 refills | Status: DC

## 2022-10-13 NOTE — Progress Notes (Signed)
   Chief Complaint  Patient presents with   Follow-up    Patient is here for right foot follow-up, the patient is still havig some swelling and just came out of the cam boot over the thanksgiving holiday.    Subjective:  Patient presents today status post bunionectomy with osteotomy right. DOS: 08/20/2022.  Patient states that she is doing well.  Patient does have some tenderness while walking in tennis shoes.  She continues to wear the compression sleeve and wearing good supportive tennis shoes.  Patient also completed her second month of oral Lamisil.  The pharmacy is only dispensing #30 at a time.  She is requesting her last refill.  Past Medical History:  Diagnosis Date   Anemia    Anxiety    Depression    Fibroid    History of blood clots    Hypertension    Iron deficiency anemia    Right leg DVT [facility])     Past Surgical History:  Procedure Laterality Date   GASTRIC BYPASS     2007   LAPAROSCOPIC TOTAL HYSTERECTOMY     removed cervix/uterus, removal of b/l tubes and ovary(s) fibroid 09/04/19 [facility] Dr. Delle Reining, Iona Coach MD; no need for future paps as of 11/22/19     Allergies  Allergen Reactions   Amlodipine Other (See Comments)    Leg swelling 5 mg dose   Trazodone And Nefazodone     Felt weird      Objective/Physical Exam Neurovascular status intact.  Skin incisions healed. No sign of infectious process noted. No dehiscence.  There continues to be some minimal edema noted to the surgical extremity.  Good range of motion of the first MTP joint.  No pain on palpation or range of motion of the first MTP joint.  Hyperkeratotic dystrophic nails noted.  The base of the right great toenail does appear to be growing healthy viable nail plate.  Approximately 1 mm of growth along the base of the nail plate.  Radiographic Exam RT foot 10/13/2022:  No appreciable change since last x-rays taken.  Orthopedic hardware and osteotomies sites appear to be stable with routine  healing.  On AP view there does appear to be some slight overcorrection of the distal portion of the first metatarsal however overall good realignment of the first ray.  Lateral view the osteotomy site of the first metatarsal still visible but it appears stable and intact.  Assessment: 1. s/p bunionectomy with osteotomy RT foot. DOS: 08/20/2022 2.  Onychomycosis of toenails right foot   Plan of Care:  1. Patient was evaluated.  2.  Continue compression sleeve as needed 3.  Continue wearing good supportive shoes and sneakers 4.  Continue Lamisil as prescribed x90 days  5.  Return to clinic 4 weeks  Edrick Kins, DPM [facility]  Dr. Edrick Kins, DPM    [address], [place] [address]                Office [phone]  Fax [phone]

## 2022-10-23 DIAGNOSIS — M67431 Ganglion, right wrist: Secondary | ICD-10-CM | POA: Diagnosis not present

## 2022-10-23 DIAGNOSIS — G8918 Other acute postprocedural pain: Secondary | ICD-10-CM | POA: Diagnosis not present

## 2022-10-27 ENCOUNTER — Other Ambulatory Visit: Payer: Self-pay

## 2022-10-27 DIAGNOSIS — F5081 Binge eating disorder: Secondary | ICD-10-CM | POA: Diagnosis not present

## 2022-10-27 DIAGNOSIS — E1159 Type 2 diabetes mellitus with other circulatory complications: Secondary | ICD-10-CM | POA: Diagnosis not present

## 2022-10-27 DIAGNOSIS — E118 Type 2 diabetes mellitus with unspecified complications: Secondary | ICD-10-CM | POA: Diagnosis not present

## 2022-10-27 MED ORDER — MOUNJARO 15 MG/0.5ML ~~LOC~~ SOAJ
15.0000 mg | SUBCUTANEOUS | 1 refills | Status: DC
  Filled 2022-10-27: qty 2, 28d supply, fill #0
  Filled 2022-12-01: qty 2, 28d supply, fill #1
  Filled 2023-01-06: qty 2, 28d supply, fill #2
  Filled 2023-02-08: qty 2, 28d supply, fill #3

## 2022-11-18 ENCOUNTER — Other Ambulatory Visit: Payer: Self-pay | Admitting: Family

## 2022-11-18 DIAGNOSIS — E559 Vitamin D deficiency, unspecified: Secondary | ICD-10-CM

## 2022-12-01 ENCOUNTER — Other Ambulatory Visit: Payer: Self-pay

## 2022-12-14 DIAGNOSIS — Z9884 Bariatric surgery status: Secondary | ICD-10-CM | POA: Diagnosis not present

## 2022-12-14 DIAGNOSIS — E538 Deficiency of other specified B group vitamins: Secondary | ICD-10-CM | POA: Diagnosis not present

## 2022-12-14 DIAGNOSIS — N920 Excessive and frequent menstruation with regular cycle: Secondary | ICD-10-CM | POA: Diagnosis not present

## 2022-12-14 DIAGNOSIS — E118 Type 2 diabetes mellitus with unspecified complications: Secondary | ICD-10-CM | POA: Diagnosis not present

## 2022-12-14 DIAGNOSIS — E1169 Type 2 diabetes mellitus with other specified complication: Secondary | ICD-10-CM | POA: Diagnosis not present

## 2022-12-14 DIAGNOSIS — E559 Vitamin D deficiency, unspecified: Secondary | ICD-10-CM | POA: Diagnosis not present

## 2022-12-14 DIAGNOSIS — F5081 Binge eating disorder: Secondary | ICD-10-CM | POA: Diagnosis not present

## 2023-01-06 ENCOUNTER — Other Ambulatory Visit: Payer: Self-pay

## 2023-01-28 DIAGNOSIS — I10 Essential (primary) hypertension: Secondary | ICD-10-CM | POA: Diagnosis not present

## 2023-01-28 DIAGNOSIS — E1169 Type 2 diabetes mellitus with other specified complication: Secondary | ICD-10-CM | POA: Diagnosis not present

## 2023-01-28 DIAGNOSIS — F5081 Binge eating disorder: Secondary | ICD-10-CM | POA: Diagnosis not present

## 2023-01-28 DIAGNOSIS — E669 Obesity, unspecified: Secondary | ICD-10-CM | POA: Diagnosis not present

## 2023-02-08 ENCOUNTER — Other Ambulatory Visit: Payer: Self-pay

## 2023-02-08 ENCOUNTER — Encounter: Payer: BC Managed Care – PPO | Admitting: Family Medicine

## 2023-02-08 MED ORDER — MOUNJARO 15 MG/0.5ML ~~LOC~~ SOAJ
15.0000 mg | SUBCUTANEOUS | 1 refills | Status: DC
  Filled 2023-02-08 – 2023-02-10 (×2): qty 2, 28d supply, fill #0

## 2023-02-10 ENCOUNTER — Other Ambulatory Visit: Payer: Self-pay

## 2023-02-24 ENCOUNTER — Ambulatory Visit: Payer: BC Managed Care – PPO | Admitting: Family Medicine

## 2023-02-24 ENCOUNTER — Encounter: Payer: Self-pay | Admitting: Family Medicine

## 2023-02-24 VITALS — BP 118/70 | HR 74 | Temp 98.2°F | Ht 69.0 in | Wt 208.0 lb

## 2023-02-24 DIAGNOSIS — E538 Deficiency of other specified B group vitamins: Secondary | ICD-10-CM

## 2023-02-24 DIAGNOSIS — G4700 Insomnia, unspecified: Secondary | ICD-10-CM

## 2023-02-24 DIAGNOSIS — E559 Vitamin D deficiency, unspecified: Secondary | ICD-10-CM | POA: Diagnosis not present

## 2023-02-24 DIAGNOSIS — Z1231 Encounter for screening mammogram for malignant neoplasm of breast: Secondary | ICD-10-CM

## 2023-02-24 DIAGNOSIS — E669 Obesity, unspecified: Secondary | ICD-10-CM | POA: Diagnosis not present

## 2023-02-24 DIAGNOSIS — E611 Iron deficiency: Secondary | ICD-10-CM

## 2023-02-24 DIAGNOSIS — Z1211 Encounter for screening for malignant neoplasm of colon: Secondary | ICD-10-CM

## 2023-02-24 DIAGNOSIS — I10 Essential (primary) hypertension: Secondary | ICD-10-CM

## 2023-02-24 DIAGNOSIS — F5104 Psychophysiologic insomnia: Secondary | ICD-10-CM | POA: Diagnosis not present

## 2023-02-24 LAB — VITAMIN D 25 HYDROXY (VIT D DEFICIENCY, FRACTURES): VITD: 38.8 ng/mL (ref 30.00–100.00)

## 2023-02-24 LAB — CBC WITH DIFFERENTIAL/PLATELET
Basophils Absolute: 0 10*3/uL (ref 0.0–0.1)
Basophils Relative: 0.8 % (ref 0.0–3.0)
Eosinophils Absolute: 0 10*3/uL (ref 0.0–0.7)
Eosinophils Relative: 1.5 % (ref 0.0–5.0)
HCT: 37.2 % (ref 36.0–46.0)
Hemoglobin: 12.3 g/dL (ref 12.0–15.0)
Lymphocytes Relative: 43.8 % (ref 12.0–46.0)
Lymphs Abs: 1.4 10*3/uL (ref 0.7–4.0)
MCHC: 33.2 g/dL (ref 30.0–36.0)
MCV: 87.5 fl (ref 78.0–100.0)
Monocytes Absolute: 0.2 10*3/uL (ref 0.1–1.0)
Monocytes Relative: 6.3 % (ref 3.0–12.0)
Neutro Abs: 1.6 10*3/uL (ref 1.4–7.7)
Neutrophils Relative %: 47.6 % (ref 43.0–77.0)
Platelets: 274 10*3/uL (ref 150.0–400.0)
RBC: 4.25 Mil/uL (ref 3.87–5.11)
RDW: 13.3 % (ref 11.5–15.5)
WBC: 3.3 10*3/uL — ABNORMAL LOW (ref 4.0–10.5)

## 2023-02-24 LAB — LIPID PANEL
Cholesterol: 166 mg/dL (ref 0–200)
HDL: 79.9 mg/dL (ref >39.00)
LDL Cholesterol: 74 mg/dL (ref 0–99)
NonHDL: 86.05
Total CHOL/HDL Ratio: 2
Triglycerides: 62 mg/dL (ref 0.0–149.0)
VLDL: 12.4 mg/dL (ref 0.0–40.0)

## 2023-02-24 LAB — COMPREHENSIVE METABOLIC PANEL
ALT: 21 U/L (ref 0–35)
AST: 23 U/L (ref 0–37)
Albumin: 4.1 g/dL (ref 3.5–5.2)
Alkaline Phosphatase: 80 U/L (ref 39–117)
BUN: 6 mg/dL (ref 6–23)
CO2: 27 mEq/L (ref 19–32)
Calcium: 8.8 mg/dL (ref 8.4–10.5)
Chloride: 105 mEq/L (ref 96–112)
Creatinine, Ser: 0.79 mg/dL (ref 0.40–1.20)
GFR: 90.58 mL/min (ref >60.00)
Glucose, Bld: 77 mg/dL (ref 70–99)
Potassium: 3.5 mEq/L (ref 3.5–5.1)
Sodium: 140 mEq/L (ref 135–145)
Total Bilirubin: 0.6 mg/dL (ref 0.2–1.2)
Total Protein: 6.4 g/dL (ref 6.0–8.3)

## 2023-02-24 LAB — VITAMIN B12: Vitamin B-12: 246 pg/mL (ref 211–911)

## 2023-02-24 LAB — TSH: TSH: 0.96 u[IU]/mL (ref 0.35–5.50)

## 2023-02-24 MED ORDER — VITAMIN D (ERGOCALCIFEROL) 1.25 MG (50000 UNIT) PO CAPS
50000.0000 [IU] | ORAL_CAPSULE | ORAL | 1 refills | Status: AC
Start: 2023-02-24 — End: ?

## 2023-02-24 MED ORDER — VITAMIN B-12 1000 MCG SL SUBL
1000.0000 ug | SUBLINGUAL_TABLET | Freq: Every day | SUBLINGUAL | 3 refills | Status: AC
Start: 2023-02-24 — End: ?

## 2023-02-24 NOTE — Patient Instructions (Addendum)
It was a pleasure meeting you today. Thank you for allowing me to take part in your health care.  Our goals for today as we discussed include:  Referral sent for Mammogram. Please call to schedule appointment. [facility] [address] [place], [place] [address] [phone]   You have a refill of the Ambien at the pharmacy.     Please schedule appointment for fasting blood work.   If you have any questions or concerns, please do not hesitate to call the office at [phone].  I look forward to our next visit and until then take care and stay safe.  Regards,   Dana Allan, MD   [facility]

## 2023-02-24 NOTE — Progress Notes (Signed)
SUBJECTIVE:   Chief Complaint  Patient presents with   Transitions Of Care   HPI Patient presents to clinic to transfer care.  No acute concerns today.  Patient follows with healthy weight and wellness clinic for weight loss management.  Current medications include Wellbutrin SR 150 mg daily, Vyvanse 30 mg a.m. and 40 mg p.m., Mounjaro 15 mg weekly.  Tolerating medications well.  Vitamin B12 deficiency History of gastric bypass.  B12 levels low.  Was prescribed B12 injection however does not always take medication.  Willing to try sublingual medication given similar efficacy.    Sleep disorder Has had chronic issues with sleep.  History of anxiety and depression.  Tried SSRIs and trazodone in the past that was not helpful.  IR Ambien not helpful.  Ambien CR 12.5 mg has been beneficial.  Does not require refill at this time.  Discussed increased risk of falls, confusion, association to cognitive disorders with long-term use, patient prefers to remain on medication.  She is currently on 70 mg of Vyvanse and Wellbutrin 150 mg for weight loss disorder.  This likely is contributing to insomnia.   PERTINENT PMH / PSH: History of gastric bypass Total hysterectomy Hypertension Obesity class I B12 deficiency Vitamin D deficient   OBJECTIVE:  BP 118/70   Pulse 74   Temp 98.2 F (36.8 C) (Oral)   Ht  (1.753 m)   Wt 208 lb (94.3 kg)   LMP  (LMP Unknown)   SpO2 99%   BMI 30.72 kg/m    Physical Exam Vitals reviewed.  Constitutional:      General: She is not in acute distress.    Appearance: Normal appearance. She is obese. She is not ill-appearing.  HENT:     Head: Normocephalic.  Eyes:     Conjunctiva/sclera: Conjunctivae normal.  Cardiovascular:     Rate and Rhythm: Normal rate and regular rhythm.     Pulses: Normal pulses.  Pulmonary:     Effort: Pulmonary effort is normal.     Breath sounds: Normal breath sounds.  Abdominal:     General: Bowel sounds are  normal.  Neurological:     Mental Status: She is alert. Mental status is at baseline.  Psychiatric:        Mood and Affect: Mood normal.        Behavior: Behavior normal.        Thought Content: Thought content normal.        Judgment: Judgment normal.     ASSESSMENT/PLAN:  Chronic insomnia Assessment & Plan: Chronic use of sleep medication.  Likely use of Wellbutrin and Vyvanse contributing to insomnia. Discussed risk of long-term use of Ambien.  Currently on max dose of extended release.  Would like to wean down in future if possible given next dose in women greater than 45 years of age 53.25 mg.  Patient not receptive to weaning medication. Does not need refill currently.  Will continue Ambien CR 12.5 mg daily Would benefit from psychiatric referral for evaluation of current medication and possible underlying etiology. Recommend sleep hygiene Follow-up as needed.     Essential hypertension Assessment & Plan: History of hypertension.  Not currently on medication and well-controlled.  Has had significant weight loss. Will continue to monitor.  Orders: -     Comprehensive metabolic panel; Future -     CBC with Differential/Platelet; Future  Vitamin D deficiency -     VITAMIN D 25 Hydroxy (Vit-D Deficiency, Fractures); Future -  Vitamin D (Ergocalciferol); Take 1 capsule (50,000 Units total) by mouth every 7 (seven) days.  Dispense: 12 capsule; Refill: 1  B12 deficiency Assessment & Plan: History of gastric bypass.  Not currently on vitamin B12 supplementation.  Recent B12 levels have been low.  Was on monthly injections but self discontinued. Check vitamin B-12 Will start vitamin B12 1000 mcg sublingual daily.  Recheck levels in 1 month  Orders: -     Vitamin B12; Future -     Vitamin B-12; Place 1 tablet (1,000 mcg total) under the tongue daily.  Dispense: 90 tablet; Refill: 3  Breast cancer screening by mammogram -     3D Screening Mammogram, Left and Right;  Future  Class 1 obesity -     Lipid panel; Future -     TSH; Future  HCM Declined colonoscopy and Cologuard Mammogram referral sent Declined HIV and hepatitis C screening No indication for urine ACR.  Patient not diabetic. Pap not indicated.  History of TAH.  PDMP reviewed  Return in about 6 months (around 08/26/2023) for PCP.  Dana Allan, MD

## 2023-02-25 ENCOUNTER — Other Ambulatory Visit: Payer: Self-pay | Admitting: Family Medicine

## 2023-02-25 DIAGNOSIS — D72819 Decreased white blood cell count, unspecified: Secondary | ICD-10-CM

## 2023-02-28 ENCOUNTER — Encounter: Payer: Self-pay | Admitting: Family Medicine

## 2023-02-28 DIAGNOSIS — E66811 Obesity, class 1: Secondary | ICD-10-CM | POA: Insufficient documentation

## 2023-02-28 DIAGNOSIS — E611 Iron deficiency: Secondary | ICD-10-CM | POA: Insufficient documentation

## 2023-02-28 DIAGNOSIS — Z1231 Encounter for screening mammogram for malignant neoplasm of breast: Secondary | ICD-10-CM | POA: Insufficient documentation

## 2023-02-28 DIAGNOSIS — E669 Obesity, unspecified: Secondary | ICD-10-CM | POA: Insufficient documentation

## 2023-02-28 NOTE — Assessment & Plan Note (Signed)
Chronic use of sleep medication.  Likely use of Wellbutrin and Vyvanse contributing to insomnia. Discussed risk of long-term use of Ambien.  Currently on max dose of extended release.  Would like to wean down in future if possible given next dose in women greater than 45 years of age 44.25 mg.  Patient not receptive to weaning medication. Does not need refill currently.  Will continue Ambien CR 12.5 mg daily Would benefit from psychiatric referral for evaluation of current medication and possible underlying etiology. Recommend sleep hygiene Follow-up as needed.

## 2023-02-28 NOTE — Assessment & Plan Note (Signed)
History of gastric bypass.  Not currently on vitamin B12 supplementation.  Recent B12 levels have been low.  Was on monthly injections but self discontinued. Check vitamin B-12 Will start vitamin B12 1000 mcg sublingual daily.  Recheck levels in 1 month

## 2023-02-28 NOTE — Assessment & Plan Note (Signed)
History of hypertension.  Not currently on medication and well-controlled.  Has had significant weight loss. Will continue to monitor.

## 2023-03-04 ENCOUNTER — Encounter: Payer: Self-pay | Admitting: *Deleted

## 2023-03-15 DIAGNOSIS — I10 Essential (primary) hypertension: Secondary | ICD-10-CM | POA: Diagnosis not present

## 2023-03-15 DIAGNOSIS — Z9189 Other specified personal risk factors, not elsewhere classified: Secondary | ICD-10-CM | POA: Diagnosis not present

## 2023-03-15 DIAGNOSIS — F5081 Binge eating disorder: Secondary | ICD-10-CM | POA: Diagnosis not present

## 2023-03-15 DIAGNOSIS — E1169 Type 2 diabetes mellitus with other specified complication: Secondary | ICD-10-CM | POA: Diagnosis not present

## 2023-03-19 ENCOUNTER — Encounter: Payer: Self-pay | Admitting: Family Medicine

## 2023-03-19 DIAGNOSIS — G4700 Insomnia, unspecified: Secondary | ICD-10-CM

## 2023-03-19 MED ORDER — ZOLPIDEM TARTRATE ER 12.5 MG PO TBCR
12.5000 mg | EXTENDED_RELEASE_TABLET | Freq: Every evening | ORAL | 0 refills | Status: DC | PRN
Start: 2023-03-19 — End: 2023-04-07

## 2023-03-19 NOTE — Telephone Encounter (Signed)
Med pended for approval

## 2023-04-06 NOTE — Telephone Encounter (Signed)
Pt called regarding a mychart message

## 2023-04-07 ENCOUNTER — Other Ambulatory Visit: Payer: Self-pay | Admitting: Family Medicine

## 2023-04-07 DIAGNOSIS — G4700 Insomnia, unspecified: Secondary | ICD-10-CM

## 2023-04-07 MED ORDER — ZOLPIDEM TARTRATE ER 12.5 MG PO TBCR
12.5000 mg | EXTENDED_RELEASE_TABLET | Freq: Every evening | ORAL | 0 refills | Status: DC | PRN
Start: 2023-04-07 — End: 2024-10-14

## 2023-04-07 MED ORDER — ZOLPIDEM TARTRATE ER 12.5 MG PO TBCR
12.5000 mg | EXTENDED_RELEASE_TABLET | Freq: Every evening | ORAL | 0 refills | Status: DC | PRN
Start: 2023-04-07 — End: 2023-04-07

## 2023-04-07 NOTE — Telephone Encounter (Signed)
Patient called about medication, she has been out since Saturday.

## 2023-04-13 ENCOUNTER — Other Ambulatory Visit (HOSPITAL_COMMUNITY): Payer: Self-pay

## 2023-04-13 DIAGNOSIS — G4709 Other insomnia: Secondary | ICD-10-CM | POA: Diagnosis not present

## 2023-04-13 DIAGNOSIS — E1169 Type 2 diabetes mellitus with other specified complication: Secondary | ICD-10-CM | POA: Diagnosis not present

## 2023-04-13 DIAGNOSIS — L987 Excessive and redundant skin and subcutaneous tissue: Secondary | ICD-10-CM | POA: Diagnosis not present

## 2023-04-13 DIAGNOSIS — F5081 Binge eating disorder: Secondary | ICD-10-CM | POA: Diagnosis not present

## 2023-04-13 MED ORDER — LISDEXAMFETAMINE DIMESYLATE 60 MG PO CAPS
ORAL_CAPSULE | ORAL | 0 refills | Status: DC
  Filled 2023-04-13: qty 30, 30d supply, fill #0

## 2023-04-13 MED ORDER — MOUNJARO 15 MG/0.5ML ~~LOC~~ SOAJ
SUBCUTANEOUS | 0 refills | Status: DC
  Filled 2023-04-13: qty 2, 28d supply, fill #0

## 2023-04-13 MED ORDER — ZOLPIDEM TARTRATE ER 12.5 MG PO TBCR
EXTENDED_RELEASE_TABLET | ORAL | 3 refills | Status: DC
  Filled 2023-04-13 – 2023-05-06 (×2): qty 90, 90d supply, fill #0
  Filled 2023-08-03: qty 90, 90d supply, fill #1

## 2023-04-13 MED ORDER — FLUCONAZOLE 150 MG PO TABS
ORAL_TABLET | ORAL | 0 refills | Status: DC
  Filled 2023-04-13: qty 4, 30d supply, fill #0

## 2023-04-14 ENCOUNTER — Other Ambulatory Visit (HOSPITAL_COMMUNITY): Payer: Self-pay

## 2023-04-16 ENCOUNTER — Other Ambulatory Visit (HOSPITAL_COMMUNITY): Payer: Self-pay

## 2023-04-16 ENCOUNTER — Other Ambulatory Visit: Payer: Self-pay

## 2023-05-06 ENCOUNTER — Other Ambulatory Visit: Payer: Self-pay

## 2023-05-06 ENCOUNTER — Other Ambulatory Visit (HOSPITAL_COMMUNITY): Payer: Self-pay

## 2023-05-11 ENCOUNTER — Other Ambulatory Visit (HOSPITAL_COMMUNITY): Payer: Self-pay

## 2023-05-11 DIAGNOSIS — D508 Other iron deficiency anemias: Secondary | ICD-10-CM | POA: Diagnosis not present

## 2023-05-11 DIAGNOSIS — E1169 Type 2 diabetes mellitus with other specified complication: Secondary | ICD-10-CM | POA: Diagnosis not present

## 2023-05-11 DIAGNOSIS — F5081 Binge eating disorder: Secondary | ICD-10-CM | POA: Diagnosis not present

## 2023-05-11 DIAGNOSIS — E538 Deficiency of other specified B group vitamins: Secondary | ICD-10-CM | POA: Diagnosis not present

## 2023-05-11 MED ORDER — MOUNJARO 12.5 MG/0.5ML ~~LOC~~ SOAJ
12.5000 mg | SUBCUTANEOUS | 3 refills | Status: DC
  Filled 2023-05-11: qty 2, 28d supply, fill #0

## 2023-05-14 ENCOUNTER — Other Ambulatory Visit (HOSPITAL_COMMUNITY): Payer: Self-pay

## 2023-05-18 DIAGNOSIS — M25461 Effusion, right knee: Secondary | ICD-10-CM | POA: Diagnosis not present

## 2023-05-18 DIAGNOSIS — M1711 Unilateral primary osteoarthritis, right knee: Secondary | ICD-10-CM | POA: Diagnosis not present

## 2023-05-18 DIAGNOSIS — M7121 Synovial cyst of popliteal space [Baker], right knee: Secondary | ICD-10-CM | POA: Diagnosis not present

## 2023-05-18 DIAGNOSIS — G8929 Other chronic pain: Secondary | ICD-10-CM | POA: Diagnosis not present

## 2023-06-07 DIAGNOSIS — E669 Obesity, unspecified: Secondary | ICD-10-CM | POA: Diagnosis not present

## 2023-06-07 DIAGNOSIS — E1169 Type 2 diabetes mellitus with other specified complication: Secondary | ICD-10-CM | POA: Diagnosis not present

## 2023-06-07 DIAGNOSIS — K5900 Constipation, unspecified: Secondary | ICD-10-CM | POA: Diagnosis not present

## 2023-06-07 DIAGNOSIS — F5081 Binge eating disorder: Secondary | ICD-10-CM | POA: Diagnosis not present

## 2023-06-08 ENCOUNTER — Other Ambulatory Visit (HOSPITAL_COMMUNITY): Payer: Self-pay

## 2023-06-08 MED ORDER — MOUNJARO 12.5 MG/0.5ML ~~LOC~~ SOAJ
12.5000 mg | SUBCUTANEOUS | 3 refills | Status: DC
  Filled 2023-06-08: qty 2, 28d supply, fill #0
  Filled 2023-07-26: qty 2, 28d supply, fill #1
  Filled 2023-08-28 (×2): qty 2, 28d supply, fill #2

## 2023-06-12 ENCOUNTER — Other Ambulatory Visit (HOSPITAL_COMMUNITY): Payer: Self-pay

## 2023-06-17 ENCOUNTER — Other Ambulatory Visit (HOSPITAL_COMMUNITY): Payer: Self-pay

## 2023-07-08 DIAGNOSIS — F5081 Binge eating disorder: Secondary | ICD-10-CM | POA: Diagnosis not present

## 2023-07-08 DIAGNOSIS — E538 Deficiency of other specified B group vitamins: Secondary | ICD-10-CM | POA: Diagnosis not present

## 2023-07-08 DIAGNOSIS — Z9189 Other specified personal risk factors, not elsewhere classified: Secondary | ICD-10-CM | POA: Diagnosis not present

## 2023-07-08 DIAGNOSIS — L304 Erythema intertrigo: Secondary | ICD-10-CM | POA: Diagnosis not present

## 2023-07-08 DIAGNOSIS — E1169 Type 2 diabetes mellitus with other specified complication: Secondary | ICD-10-CM | POA: Diagnosis not present

## 2023-08-03 ENCOUNTER — Other Ambulatory Visit: Payer: Self-pay

## 2023-08-05 ENCOUNTER — Other Ambulatory Visit (HOSPITAL_COMMUNITY): Payer: Self-pay

## 2023-08-19 ENCOUNTER — Other Ambulatory Visit: Payer: BC Managed Care – PPO

## 2023-08-26 ENCOUNTER — Ambulatory Visit: Payer: BC Managed Care – PPO | Admitting: Family Medicine

## 2023-08-28 ENCOUNTER — Other Ambulatory Visit (HOSPITAL_COMMUNITY): Payer: Self-pay

## 2023-09-02 ENCOUNTER — Other Ambulatory Visit (HOSPITAL_COMMUNITY): Payer: Self-pay

## 2023-09-02 DIAGNOSIS — L987 Excessive and redundant skin and subcutaneous tissue: Secondary | ICD-10-CM | POA: Diagnosis not present

## 2023-09-02 DIAGNOSIS — I10 Essential (primary) hypertension: Secondary | ICD-10-CM | POA: Diagnosis not present

## 2023-09-02 DIAGNOSIS — E538 Deficiency of other specified B group vitamins: Secondary | ICD-10-CM | POA: Diagnosis not present

## 2023-09-02 DIAGNOSIS — E1169 Type 2 diabetes mellitus with other specified complication: Secondary | ICD-10-CM | POA: Diagnosis not present

## 2023-09-02 MED ORDER — MOUNJARO 10 MG/0.5ML ~~LOC~~ SOAJ
10.0000 mg | SUBCUTANEOUS | 3 refills | Status: DC
  Filled 2023-09-02 – 2023-09-20 (×2): qty 2, 28d supply, fill #0
  Filled 2024-04-07: qty 2, 28d supply, fill #1

## 2023-09-04 ENCOUNTER — Other Ambulatory Visit (HOSPITAL_COMMUNITY): Payer: Self-pay

## 2023-09-04 MED ORDER — MOUNJARO 10 MG/0.5ML ~~LOC~~ SOAJ
10.0000 mg | SUBCUTANEOUS | 3 refills | Status: DC
  Filled 2023-09-04 – 2023-10-30 (×2): qty 2, 28d supply, fill #0
  Filled 2023-11-30: qty 2, 28d supply, fill #1

## 2023-09-06 ENCOUNTER — Other Ambulatory Visit (HOSPITAL_COMMUNITY): Payer: Self-pay

## 2023-09-21 ENCOUNTER — Other Ambulatory Visit: Payer: Self-pay

## 2023-09-21 ENCOUNTER — Other Ambulatory Visit (HOSPITAL_COMMUNITY): Payer: Self-pay

## 2023-09-30 ENCOUNTER — Other Ambulatory Visit (HOSPITAL_COMMUNITY): Payer: Self-pay

## 2023-09-30 DIAGNOSIS — E559 Vitamin D deficiency, unspecified: Secondary | ICD-10-CM | POA: Diagnosis not present

## 2023-09-30 DIAGNOSIS — E538 Deficiency of other specified B group vitamins: Secondary | ICD-10-CM | POA: Diagnosis not present

## 2023-09-30 DIAGNOSIS — Z9884 Bariatric surgery status: Secondary | ICD-10-CM | POA: Diagnosis not present

## 2023-09-30 DIAGNOSIS — Z9189 Other specified personal risk factors, not elsewhere classified: Secondary | ICD-10-CM | POA: Diagnosis not present

## 2023-09-30 DIAGNOSIS — E1169 Type 2 diabetes mellitus with other specified complication: Secondary | ICD-10-CM | POA: Diagnosis not present

## 2023-09-30 DIAGNOSIS — R632 Polyphagia: Secondary | ICD-10-CM | POA: Diagnosis not present

## 2023-10-30 ENCOUNTER — Other Ambulatory Visit (HOSPITAL_COMMUNITY): Payer: Self-pay

## 2023-11-04 ENCOUNTER — Other Ambulatory Visit (HOSPITAL_COMMUNITY): Payer: Self-pay

## 2023-12-02 DIAGNOSIS — E1169 Type 2 diabetes mellitus with other specified complication: Secondary | ICD-10-CM | POA: Diagnosis not present

## 2023-12-02 DIAGNOSIS — M25471 Effusion, right ankle: Secondary | ICD-10-CM | POA: Diagnosis not present

## 2023-12-02 DIAGNOSIS — E663 Overweight: Secondary | ICD-10-CM | POA: Diagnosis not present

## 2023-12-02 DIAGNOSIS — F50811 Binge eating disorder, moderate: Secondary | ICD-10-CM | POA: Diagnosis not present

## 2023-12-02 DIAGNOSIS — R632 Polyphagia: Secondary | ICD-10-CM | POA: Diagnosis not present

## 2023-12-03 ENCOUNTER — Other Ambulatory Visit: Payer: Self-pay | Admitting: Family Medicine

## 2023-12-03 DIAGNOSIS — M25471 Effusion, right ankle: Secondary | ICD-10-CM

## 2023-12-30 DIAGNOSIS — E538 Deficiency of other specified B group vitamins: Secondary | ICD-10-CM | POA: Diagnosis not present

## 2023-12-30 DIAGNOSIS — K5909 Other constipation: Secondary | ICD-10-CM | POA: Diagnosis not present

## 2023-12-30 DIAGNOSIS — E1169 Type 2 diabetes mellitus with other specified complication: Secondary | ICD-10-CM | POA: Diagnosis not present

## 2023-12-30 DIAGNOSIS — E663 Overweight: Secondary | ICD-10-CM | POA: Diagnosis not present

## 2023-12-31 ENCOUNTER — Other Ambulatory Visit (HOSPITAL_COMMUNITY): Payer: Self-pay

## 2023-12-31 MED ORDER — MOUNJARO 7.5 MG/0.5ML ~~LOC~~ SOAJ
7.5000 mg | SUBCUTANEOUS | 0 refills | Status: DC
  Filled 2023-12-31: qty 2, 28d supply, fill #0

## 2024-01-27 ENCOUNTER — Other Ambulatory Visit (HOSPITAL_COMMUNITY): Payer: Self-pay

## 2024-01-27 ENCOUNTER — Other Ambulatory Visit: Payer: Self-pay

## 2024-01-27 DIAGNOSIS — F50811 Binge eating disorder, moderate: Secondary | ICD-10-CM | POA: Diagnosis not present

## 2024-01-27 DIAGNOSIS — E538 Deficiency of other specified B group vitamins: Secondary | ICD-10-CM | POA: Diagnosis not present

## 2024-01-27 DIAGNOSIS — E1169 Type 2 diabetes mellitus with other specified complication: Secondary | ICD-10-CM | POA: Diagnosis not present

## 2024-01-27 DIAGNOSIS — I10 Essential (primary) hypertension: Secondary | ICD-10-CM | POA: Diagnosis not present

## 2024-01-27 MED ORDER — MOUNJARO 7.5 MG/0.5ML ~~LOC~~ SOAJ
7.5000 mg | SUBCUTANEOUS | 1 refills | Status: DC
  Filled 2024-01-27 – 2024-02-11 (×2): qty 6, 84d supply, fill #0

## 2024-02-08 ENCOUNTER — Other Ambulatory Visit (HOSPITAL_COMMUNITY): Payer: Self-pay

## 2024-02-11 ENCOUNTER — Other Ambulatory Visit: Payer: Self-pay

## 2024-02-11 ENCOUNTER — Other Ambulatory Visit (HOSPITAL_COMMUNITY): Payer: Self-pay

## 2024-02-16 ENCOUNTER — Other Ambulatory Visit (HOSPITAL_COMMUNITY): Payer: Self-pay

## 2024-02-23 ENCOUNTER — Telehealth: Payer: Self-pay

## 2024-02-23 NOTE — Telephone Encounter (Signed)
 FYI

## 2024-02-23 NOTE — Telephone Encounter (Signed)
 Pt needs a TOC appt scheduled /follow up on [name]

## 2024-02-23 NOTE — Telephone Encounter (Signed)
 Patient was called and scheduled a [name] with Dr Charlann Lange. Patient states she did not want to see Dr Clent Ridges. She also stated she will decide after meeting Dr Charlann Lange if she wants to do a TOC to her.

## 2024-02-25 ENCOUNTER — Other Ambulatory Visit (HOSPITAL_COMMUNITY): Payer: Self-pay

## 2024-02-25 DIAGNOSIS — K5903 Drug induced constipation: Secondary | ICD-10-CM | POA: Diagnosis not present

## 2024-02-25 DIAGNOSIS — E1169 Type 2 diabetes mellitus with other specified complication: Secondary | ICD-10-CM | POA: Diagnosis not present

## 2024-02-25 DIAGNOSIS — R632 Polyphagia: Secondary | ICD-10-CM | POA: Diagnosis not present

## 2024-02-25 DIAGNOSIS — E663 Overweight: Secondary | ICD-10-CM | POA: Diagnosis not present

## 2024-02-25 MED ORDER — MOUNJARO 10 MG/0.5ML ~~LOC~~ SOAJ
10.0000 mg | SUBCUTANEOUS | 0 refills | Status: DC
  Filled 2024-02-25: qty 6, 84d supply, fill #0

## 2024-03-24 ENCOUNTER — Other Ambulatory Visit (HOSPITAL_COMMUNITY): Payer: Self-pay

## 2024-03-24 DIAGNOSIS — E1169 Type 2 diabetes mellitus with other specified complication: Secondary | ICD-10-CM | POA: Diagnosis not present

## 2024-03-24 DIAGNOSIS — I10 Essential (primary) hypertension: Secondary | ICD-10-CM | POA: Diagnosis not present

## 2024-03-24 DIAGNOSIS — R600 Localized edema: Secondary | ICD-10-CM | POA: Diagnosis not present

## 2024-03-24 MED ORDER — MOUNJARO 10 MG/0.5ML ~~LOC~~ SOAJ
10.0000 mg | SUBCUTANEOUS | 1 refills | Status: DC
  Filled 2024-03-24 – 2024-04-21 (×3): qty 6, 84d supply, fill #0
  Filled 2024-07-11: qty 6, 84d supply, fill #1

## 2024-04-06 ENCOUNTER — Other Ambulatory Visit (HOSPITAL_COMMUNITY): Payer: Self-pay

## 2024-04-08 ENCOUNTER — Other Ambulatory Visit (HOSPITAL_COMMUNITY): Payer: Self-pay

## 2024-04-09 ENCOUNTER — Other Ambulatory Visit: Payer: Self-pay

## 2024-04-19 ENCOUNTER — Ambulatory Visit

## 2024-04-21 ENCOUNTER — Other Ambulatory Visit (HOSPITAL_COMMUNITY): Payer: Self-pay

## 2024-04-21 ENCOUNTER — Ambulatory Visit (INDEPENDENT_AMBULATORY_CARE_PROVIDER_SITE_OTHER)

## 2024-04-21 VITALS — BP 136/76 | HR 87 | Temp 97.9°F | Ht 69.0 in | Wt 192.2 lb

## 2024-04-21 DIAGNOSIS — I10 Essential (primary) hypertension: Secondary | ICD-10-CM | POA: Diagnosis not present

## 2024-04-21 DIAGNOSIS — K5903 Drug induced constipation: Secondary | ICD-10-CM | POA: Diagnosis not present

## 2024-04-21 DIAGNOSIS — R7309 Other abnormal glucose: Secondary | ICD-10-CM | POA: Diagnosis not present

## 2024-04-21 DIAGNOSIS — E1169 Type 2 diabetes mellitus with other specified complication: Secondary | ICD-10-CM | POA: Diagnosis not present

## 2024-04-21 DIAGNOSIS — R632 Polyphagia: Secondary | ICD-10-CM | POA: Diagnosis not present

## 2024-04-21 LAB — POCT GLYCOSYLATED HEMOGLOBIN (HGB A1C): Hemoglobin A1C: 4.9 % (ref 4.0–5.6)

## 2024-04-21 NOTE — Progress Notes (Signed)
 The patient visited the office after being informed she needed her [name] rechecked, though she believed it was a lab-only appointment and was unsure of the reason for the [name] recheck. Previously seen by Dr. Sueanne Emerald on 02/24/2023 for TOC, she had low WBC at that time and was advised to repeat the CBC. She thought today's visit was for the CBC repeat. The patient prefers not to follow up with Dr. Sueanne Emerald and instead sees Dr. Jonn Nett at [facility], with an appointment scheduled for this morning. She receives all medication refills through Dr. Lajuana Pilar. The patient was unclear about the need for today's appointment and the associated copay, having already had a point-of-care [name] check at the clinic. She intends to keep her appointment with Dr. Lajuana Pilar and may schedule a transfer of care appointment if desired. Practice administrator Staci Dykes spoke with the patient. Discussed briefly on her point of care [name] result, stable. Since I am not managing this patient I believe we should void her point of care [name] charge, copay for this office visit as well. Please refer to Staci Dykes' note for further details.   Alyssa Mascot, MD

## 2024-04-22 ENCOUNTER — Other Ambulatory Visit (HOSPITAL_COMMUNITY): Payer: Self-pay

## 2024-05-26 DIAGNOSIS — I951 Orthostatic hypotension: Secondary | ICD-10-CM | POA: Diagnosis not present

## 2024-05-26 DIAGNOSIS — R632 Polyphagia: Secondary | ICD-10-CM | POA: Diagnosis not present

## 2024-05-26 DIAGNOSIS — R601 Generalized edema: Secondary | ICD-10-CM | POA: Diagnosis not present

## 2024-05-26 DIAGNOSIS — E1169 Type 2 diabetes mellitus with other specified complication: Secondary | ICD-10-CM | POA: Diagnosis not present

## 2024-07-25 ENCOUNTER — Other Ambulatory Visit: Payer: Self-pay | Admitting: Family Medicine

## 2024-07-25 DIAGNOSIS — F50819 Binge eating disorder, unspecified: Secondary | ICD-10-CM

## 2024-07-25 NOTE — Telephone Encounter (Signed)
 I called and spoke with patient and notified her that she will need to contact previous office to request a refill from. Patient understands and will contact [facility].

## 2024-07-25 NOTE — Telephone Encounter (Signed)
 Copied from CRM #[id number]. Topic: Clinical - Medication Refill >> Jul 25, 2024  9:12 AM Aleatha C wrote: Medication: lisdexamfetamine (VYVANSE ) 60 MG capsule  Has the patient contacted their pharmacy? No (Agent: If no, request that the patient contact the pharmacy for the refill. If patient does not wish to contact the pharmacy document the reason why and proceed with request.) (Agent: If yes, when and what did the pharmacy advise?)  This is the patient's preferred pharmacy:  [id number] [place], [place] - [address] AT [facility] OF [address] [place] [address] Phone: [phone] Fax: [phone]  Is this the correct pharmacy for this prescription? Yes If no, delete pharmacy and type the correct one.   Has the prescription been filled recently? No  Is the patient out of the medication? Yes  Has the patient been seen for an appointment in the last year OR does the patient have an upcoming appointment? Yes  Can we respond through MyChart? No  Agent: Please be advised that Rx refills may take up to 3 business days. We ask that you follow-up with your pharmacy.

## 2024-08-18 ENCOUNTER — Other Ambulatory Visit (HOSPITAL_COMMUNITY): Payer: Self-pay

## 2024-09-12 ENCOUNTER — Other Ambulatory Visit: Payer: Self-pay

## 2024-09-12 ENCOUNTER — Other Ambulatory Visit (HOSPITAL_COMMUNITY): Payer: Self-pay

## 2024-09-14 ENCOUNTER — Other Ambulatory Visit (HOSPITAL_COMMUNITY): Payer: Self-pay

## 2024-10-06 ENCOUNTER — Other Ambulatory Visit (HOSPITAL_COMMUNITY): Payer: Self-pay

## 2024-10-06 ENCOUNTER — Ambulatory Visit: Admitting: Family Medicine

## 2024-10-06 ENCOUNTER — Encounter: Payer: Self-pay | Admitting: Family Medicine

## 2024-10-06 VITALS — BP 120/70 | HR 72 | Ht 69.5 in | Wt 201.2 lb

## 2024-10-06 DIAGNOSIS — E538 Deficiency of other specified B group vitamins: Secondary | ICD-10-CM

## 2024-10-06 DIAGNOSIS — E1169 Type 2 diabetes mellitus with other specified complication: Secondary | ICD-10-CM | POA: Diagnosis not present

## 2024-10-06 DIAGNOSIS — Z86718 Personal history of other venous thrombosis and embolism: Secondary | ICD-10-CM

## 2024-10-06 DIAGNOSIS — F50811 Binge eating disorder, moderate: Secondary | ICD-10-CM

## 2024-10-06 DIAGNOSIS — Z9884 Bariatric surgery status: Secondary | ICD-10-CM | POA: Diagnosis not present

## 2024-10-06 DIAGNOSIS — Z7985 Long-term (current) use of injectable non-insulin antidiabetic drugs: Secondary | ICD-10-CM

## 2024-10-06 DIAGNOSIS — R600 Localized edema: Secondary | ICD-10-CM

## 2024-10-06 DIAGNOSIS — Z1231 Encounter for screening mammogram for malignant neoplasm of breast: Secondary | ICD-10-CM

## 2024-10-06 DIAGNOSIS — E66811 Obesity, class 1: Secondary | ICD-10-CM | POA: Diagnosis not present

## 2024-10-06 DIAGNOSIS — N951 Menopausal and female climacteric states: Secondary | ICD-10-CM | POA: Diagnosis not present

## 2024-10-06 DIAGNOSIS — Z23 Encounter for immunization: Secondary | ICD-10-CM

## 2024-10-06 DIAGNOSIS — F5104 Psychophysiologic insomnia: Secondary | ICD-10-CM

## 2024-10-06 DIAGNOSIS — L680 Hirsutism: Secondary | ICD-10-CM

## 2024-10-06 MED ORDER — LISDEXAMFETAMINE DIMESYLATE 60 MG PO CAPS
60.0000 mg | ORAL_CAPSULE | Freq: Every morning | ORAL | 0 refills | Status: DC
  Filled 2024-10-06: qty 30, 30d supply, fill #0

## 2024-10-06 MED ORDER — MOUNJARO 10 MG/0.5ML ~~LOC~~ SOAJ
10.0000 mg | SUBCUTANEOUS | 3 refills | Status: DC
  Filled 2024-10-06: qty 2, 28d supply, fill #0

## 2024-10-06 MED ORDER — CYANOCOBALAMIN 1000 MCG/ML IJ SOLN
1000.0000 ug | Freq: Once | INTRAMUSCULAR | Status: AC
  Administered 2024-10-06: 1000 ug via INTRAMUSCULAR

## 2024-10-07 ENCOUNTER — Other Ambulatory Visit (HOSPITAL_COMMUNITY): Payer: Self-pay

## 2024-10-07 MED ORDER — ESTRADIOL 0.025 MG/24HR TD PTTW
1.0000 | MEDICATED_PATCH | TRANSDERMAL | 12 refills | Status: AC
  Filled 2024-10-07 – 2024-11-04 (×2): qty 8, 28d supply, fill #0

## 2024-10-07 MED ORDER — FUROSEMIDE 20 MG PO TABS
20.0000 mg | ORAL_TABLET | Freq: Every day | ORAL | 3 refills | Status: DC
  Filled 2024-10-07: qty 30, 30d supply, fill #0

## 2024-10-14 ENCOUNTER — Other Ambulatory Visit (HOSPITAL_COMMUNITY): Payer: Self-pay

## 2024-10-14 DIAGNOSIS — F50819 Binge eating disorder, unspecified: Secondary | ICD-10-CM | POA: Insufficient documentation

## 2024-10-14 DIAGNOSIS — E119 Type 2 diabetes mellitus without complications: Secondary | ICD-10-CM | POA: Insufficient documentation

## 2024-10-14 DIAGNOSIS — E1151 Type 2 diabetes mellitus with diabetic peripheral angiopathy without gangrene: Secondary | ICD-10-CM | POA: Insufficient documentation

## 2024-10-14 MED ORDER — FUROSEMIDE 20 MG PO TABS
20.0000 mg | ORAL_TABLET | Freq: Every day | ORAL | 3 refills | Status: AC
  Filled 2024-10-14: qty 30, 30d supply, fill #0

## 2024-10-14 MED ORDER — MOUNJARO 10 MG/0.5ML ~~LOC~~ SOAJ
10.0000 mg | SUBCUTANEOUS | 2 refills | Status: DC
  Filled 2024-10-14: qty 6, 84d supply, fill #0

## 2024-10-14 MED ORDER — ZOLPIDEM TARTRATE ER 12.5 MG PO TBCR
12.5000 mg | EXTENDED_RELEASE_TABLET | Freq: Every evening | ORAL | 0 refills | Status: AC | PRN
  Filled 2024-10-14: qty 90, 90d supply, fill #0

## 2024-10-14 NOTE — Patient Instructions (Signed)
 Estradiol  Patch Instructions:  Why This Form: Transdermal estradiol  has lower clot risk than oral estrogen and is the preferred option for people with a history of DVT.  Use: Helps with hot flashes, night sweats, mood/sleep issues, vaginal dryness. How to use: Apply to lower abdomen/back/buttock (not breasts). Change once or twice weekly as prescribed. Rotate sites. If the patch falls off, reapply or use a new one and return to normal schedule. Side effects: Mild breast tenderness, nausea, headache, bloating, or skin irritation. Call clinic for: Vaginal bleeding, severe headache, chest pain, trouble breathing, leg swelling/pain, or vision changes. Notes: Remove old patch before placing a new one. If uterus present, progesterone is also required.

## 2024-10-14 NOTE — Progress Notes (Signed)
 Assessment & Plan:   1. Type 2 diabetes mellitus with other specified complication, without long-term current use of insulin  (HCC) (Primary) - tirzepatide  (MOUNJARO ) 10 MG/0.5ML Pen; Inject 10 mg into the skin once a week.  Dispense: 6 mL; Refill: 2  2. Perimenopausal vasomotor symptoms Comments: s/p hysterectomy. Endorses mood changes, brain fog, night sweats, increase in facial hair. Interested in HRT. Due for mammogram. Rx estrogen patch. - estradiol  (VIVELLE -DOT) 0.025 MG/24HR; Place 1 patch onto the skin 2 (two) times a week.  Dispense: 8 patch; Refill: 12  3. Moderate binge-eating disorder Comments: Bryona has not had Vyvanse  for a few months. Uncontrolled. Will restart. - lisdexamfetamine (VYVANSE ) 60 MG capsule; Take 1 capsule (60 mg total) by mouth in the morning.  Dispense: 30 capsule; Refill: 0  4. Bilateral lower extremity edema Comments: Stable with Lasix . - furosemide  (LASIX ) 20 MG tablet; Take 1 tablet (20 mg total) by mouth daily.  Dispense: 90 tablet; Refill: 3  5. B12 deficiency Comments: Injection in office. - cyanocobalamin  (VITAMIN B12) injection 1,000 mcg  6. Psychophysiological insomnia Comments: Controlled with Ambien . No red flags. - zolpidem  (AMBIEN  CR) 12.5 MG CR tablet; Take 1 tablet (12.5 mg total) by mouth at bedtime as needed for sleep. ambien  5 mg qhs is not helping  Dispense: 90 tablet; Refill: 0  7. Immunization due - Flu vaccine trivalent PF, 6mos and older(Flulaval,Afluria,Fluarix,Fluzone)  8. Screening mammogram for breast cancer - MM DIGITAL SCREENING BILATERAL; Future  9. History of DVT (deep vein thrombosis), left leg  10. History of gastric bypass, 2007  11. Class 1 obesity Comments: Starting weight: 360 lbs (04/01/2022).  Geni Shutter, DO, MS, FAAFP, Dipl. [facility] [address] [place], [address] Dept: [phone] Dept Fax: [phone]  Subjective:   Patient is well  known to me from my previous clinic and is now establishing care with me as their primary care provider.  Past medical history, surgical history, allergies, family history, immunizations andmedications were updated in the EMR today and reviewed under the history and medication portions of their EMR. Review of Systems: Negative, with the exception of above mentioned in HPI.  Current Outpatient Medications:    buPROPion  (WELLBUTRIN  SR) 150 MG 12 hr tablet, Take 1 tablet (150 mg total) by mouth daily., Disp: 90 tablet, Rfl: 0   Cyanocobalamin  (VITAMIN B-12) 1000 MCG SUBL, Place 1 tablet (1,000 mcg total) under the tongue daily., Disp: 90 tablet, Rfl: 3   estradiol  (VIVELLE -DOT) 0.025 MG/24HR, Place 1 patch onto the skin 2 (two) times a week., Disp: 8 patch, Rfl: 12   spironolactone  (ALDACTONE ) 50 MG tablet, Take 1 tablet (50 mg total) by mouth daily., Disp: 90 tablet, Rfl: 3   Vitamin D , Ergocalciferol , (DRISDOL ) 1.25 MG (50000 UNIT) CAPS capsule, Take 1 capsule (50,000 Units total) by mouth every 7 (seven) days., Disp: 12 capsule, Rfl: 1   furosemide  (LASIX ) 20 MG tablet, Take 1 tablet (20 mg total) by mouth daily., Disp: 90 tablet, Rfl: 3   lisdexamfetamine (VYVANSE ) 60 MG capsule, Take 1 capsule (60 mg total) by mouth in the morning., Disp: 30 capsule, Rfl: 0   tirzepatide  (MOUNJARO ) 10 MG/0.5ML Pen, Inject 10 mg into the skin once a week., Disp: 6 mL, Rfl: 2   zolpidem  (AMBIEN  CR) 12.5 MG CR tablet, Take 1 tablet (12.5 mg total) by mouth at bedtime as needed for sleep. ambien  5 mg qhs is not helping, Disp: 90 tablet, Rfl: 0   Objective:  BP 120/70 (BP Location: Right Arm, Cuff Size: Normal)   Pulse 72   Ht 5' 9.5 (1.765 m)   Wt 201 lb 3.2 oz (91.3 kg)   LMP  (LMP Unknown)   SpO2 97%   BMI 29.29 kg/m   Wt Readings from Last 3 Encounters:  10/06/24 201 lb 3.2 oz (91.3 kg)  04/21/24 192 lb 3.2 oz (87.2 kg)  02/24/23 208 lb (94.3 kg)   Physical Exam Vitals and nursing note reviewed.   HENT:     Head: Normocephalic and atraumatic.  Eyes:     Pupils: Pupils are equal, round, and reactive to light.  Cardiovascular:     Rate and Rhythm: Normal rate and regular rhythm.     Heart sounds: Normal heart sounds.  Pulmonary:     Effort: Pulmonary effort is normal.  Abdominal:     Palpations: Abdomen is soft.  Musculoskeletal:     Cervical back: Normal range of motion and neck supple.  Skin:    General: Skin is warm.  Psychiatric:        Behavior: Behavior normal.   Note: Other outside labs reviewed and will be abstracted. Below labs are the most recent in local EPIC.  Lab Results  Component Value Date   CREATININE 0.79 02/24/2023   BUN 6 02/24/2023   NA 140 02/24/2023   K 3.5 02/24/2023   CL 105 02/24/2023   CO2 27 02/24/2023   Lab Results  Component Value Date   ALT 21 02/24/2023   AST 23 02/24/2023   ALKPHOS 80 02/24/2023   BILITOT 0.6 02/24/2023   Lab Results  Component Value Date   HGBA1C 4.9 04/21/2024   HGBA1C 5.3 10/02/2020   HGBA1C 5.5 12/23/2018   Lab Results  Component Value Date   INSULIN  6.6 10/02/2020   INSULIN  11.8 07/04/2020   Lab Results  Component Value Date   TSH 0.96 02/24/2023   Lab Results  Component Value Date   CHOL 166 02/24/2023   HDL 79.90 02/24/2023   LDLCALC 74 02/24/2023   TRIG 62.0 02/24/2023   CHOLHDL 2 02/24/2023   Lab Results  Component Value Date   VD25OH 38.80 02/24/2023   VD25OH 43.76 04/16/2022   VD25OH 18.69 (L) 10/16/2021   Lab Results  Component Value Date   WBC 3.3 (L) 02/24/2023   HGB 12.3 02/24/2023   HCT 37.2 02/24/2023   MCV 87.5 02/24/2023   PLT 274.0 02/24/2023   Lab Results  Component Value Date   IRON 69 04/16/2022   TIBC 285.6 04/16/2022   FERRITIN 302.8 (H) 04/16/2022

## 2024-10-16 ENCOUNTER — Other Ambulatory Visit (HOSPITAL_COMMUNITY): Payer: Self-pay

## 2024-10-25 ENCOUNTER — Other Ambulatory Visit (HOSPITAL_COMMUNITY): Payer: Self-pay

## 2024-11-03 ENCOUNTER — Ambulatory Visit: Admitting: Family Medicine

## 2024-11-04 ENCOUNTER — Other Ambulatory Visit (HOSPITAL_COMMUNITY): Payer: Self-pay

## 2024-11-08 ENCOUNTER — Encounter: Payer: Self-pay | Admitting: Family Medicine

## 2024-11-08 ENCOUNTER — Other Ambulatory Visit (HOSPITAL_COMMUNITY): Payer: Self-pay

## 2024-11-08 ENCOUNTER — Ambulatory Visit: Admitting: Family Medicine

## 2024-11-08 VITALS — BP 110/68 | HR 61 | Ht 69.0 in | Wt 195.4 lb

## 2024-11-08 DIAGNOSIS — Z7985 Long-term (current) use of injectable non-insulin antidiabetic drugs: Secondary | ICD-10-CM | POA: Diagnosis not present

## 2024-11-08 DIAGNOSIS — Z6828 Body mass index (BMI) 28.0-28.9, adult: Secondary | ICD-10-CM

## 2024-11-08 DIAGNOSIS — Z8639 Personal history of other endocrine, nutritional and metabolic disease: Secondary | ICD-10-CM | POA: Diagnosis not present

## 2024-11-08 DIAGNOSIS — E1169 Type 2 diabetes mellitus with other specified complication: Secondary | ICD-10-CM

## 2024-11-08 DIAGNOSIS — E663 Overweight: Secondary | ICD-10-CM | POA: Diagnosis not present

## 2024-11-08 DIAGNOSIS — F50811 Binge eating disorder, moderate: Secondary | ICD-10-CM

## 2024-11-08 DIAGNOSIS — R600 Localized edema: Secondary | ICD-10-CM

## 2024-11-08 MED ORDER — LISDEXAMFETAMINE DIMESYLATE 60 MG PO CAPS
60.0000 mg | ORAL_CAPSULE | Freq: Every morning | ORAL | 0 refills | Status: DC
  Filled 2024-11-08: qty 30, 30d supply, fill #0

## 2024-11-08 MED ORDER — MOUNJARO 10 MG/0.5ML ~~LOC~~ SOAJ
10.0000 mg | SUBCUTANEOUS | 1 refills | Status: AC
  Filled 2024-11-08: qty 6, 84d supply, fill #0

## 2024-11-08 NOTE — Progress Notes (Unsigned)
" ° °  Weight Management:   Starting weight: 360 lbs Starting date: 04/01/2022 Today's weight: *** Today's date: 11/08/2024 Total lbs lost to date: *** Total lbs lost since last in-office visit: *** Total weight loss percentage to date: -***% There is no height or weight on file to calculate BMI.   Interim History: ***.  Nutrition Plan: ***. Anti-obesity medications: ***. Reported side effects: ***. Hunger is {EWCONTROLASSESSMENT:24261}. Cravings are {EWCONTROLASSESSMENT:24261}. Activity: *** Sleep: Number of hours slept each night: ***. Sleep {ACTION; IS/IS NOT:21021397} restful.   Subjective:     Review of Systems: Negative, with the exception of above mentioned in HPI.  History:   Reviewed by clinician on day of visit: allergies, medications, problem list, medical history, surgical history, family history, social history, and previous encounter notes.  Medications:   Show/hide medication list[1] Allergies[2]  Objective:   LMP  (LMP Unknown)   Physical Exam  Results for orders placed or performed in visit on 04/21/24  POCT HgB [name]   Collection Time: 04/21/24  9:09 AM  Result Value Ref Range   Hemoglobin [name] 4.9 4.0 - 5.6 %   HbA1c POC (<> result, manual entry)     HbA1c, POC (prediabetic range)     HbA1c, POC (controlled diabetic range)       Diagnoses and Orders:   No diagnosis found. No orders of the defined types were placed in this encounter.  No orders of the defined types were placed in this encounter.   Assessment & Plan:   Assessment and Plan      Alyssa Shutter, DO, MS, FAAFP, Dipl. [facility] [address] [place], [address] Dept: [phone] Dept Fax: [phone]  Attestations:   {EW ATTESTATIONS:34266} {ACUTE VISIT ATTESTATION:34267} {EW MEDICARE SCREENING AND COUNSELING:34331}    [1]  Outpatient Medications Prior to Visit  Medication Sig   buPROPion  (WELLBUTRIN  SR) 150 MG  12 hr tablet Take 1 tablet (150 mg total) by mouth daily.   Cyanocobalamin  (VITAMIN B-12) 1000 MCG SUBL Place 1 tablet (1,000 mcg total) under the tongue daily.   estradiol  (VIVELLE -DOT) 0.025 MG/24HR Place 1 patch onto the skin 2 (two) times a week.   furosemide  (LASIX ) 20 MG tablet Take 1 tablet (20 mg total) by mouth daily.   lisdexamfetamine  (VYVANSE ) 60 MG capsule Take 1 capsule (60 mg total) by mouth in the morning.   spironolactone  (ALDACTONE ) 50 MG tablet Take 1 tablet (50 mg total) by mouth daily.   tirzepatide  (MOUNJARO ) 10 MG/0.5ML Pen Inject 10 mg into the skin once a week.   Vitamin D , Ergocalciferol , (DRISDOL ) 1.25 MG (50000 UNIT) CAPS capsule Take 1 capsule (50,000 Units total) by mouth every 7 (seven) days.   zolpidem  (AMBIEN  CR) 12.5 MG CR tablet Take 1 tablet (12.5 mg total) by mouth at bedtime as needed for sleep.   No facility-administered medications prior to visit.  [2]  Allergies Allergen Reactions   Amlodipine  Other (See Comments)    Leg swelling 5 mg dose   Trazodone And Nefazodone     Felt weird     "

## 2024-11-09 ENCOUNTER — Encounter: Payer: Self-pay | Admitting: Family Medicine

## 2024-11-09 DIAGNOSIS — Z6828 Body mass index (BMI) 28.0-28.9, adult: Secondary | ICD-10-CM | POA: Insufficient documentation

## 2024-11-09 DIAGNOSIS — Z8639 Personal history of other endocrine, nutritional and metabolic disease: Secondary | ICD-10-CM | POA: Insufficient documentation

## 2024-11-09 DIAGNOSIS — R600 Localized edema: Secondary | ICD-10-CM | POA: Insufficient documentation

## 2024-11-09 NOTE — Progress Notes (Signed)
 "  Weight Management:   Starting weight: 372 lbs Starting date: 07/04/20 Today's weight: 195 lbs Today's date: 11/09/2024 Total lbs lost to date: 177 lbs Total lbs lost since last in-office visit: 6 lbs Total weight loss percentage to date: -47.58% Body mass index is 28.86 kg/m.   Nutrition Plan: Mindful eating, with focus on adequate protein intake.. Anti-obesity medications (including off-label): Mounjaro , Vyvanse . Reported side effects: none. Hunger is well controlled. Cravings are moderately controlled. Activity: Some walking.  Sleep: Number of hours slept each night: 8. Sleep is restful.   Subjective:   Review of Systems: Negative, with the exception of above mentioned in HPI.  History:   Reviewed by clinician on day of visit: allergies, medications, problem list, medical history, surgical history, family history, social history, and previous encounter notes.  Medications:   Show/hide medication list[1] Allergies[2]  Objective:   BP 110/68 (BP Location: Right Arm, Cuff Size: Large)   Pulse 61   Ht 5' 9 (1.753 m)   Wt 195 lb 6.4 oz (88.6 kg)   LMP  (LMP Unknown)   SpO2 99%   BMI 28.86 kg/m   Physical Exam Constitutional:      General: She is not in acute distress.    Appearance: She is well-developed.  HENT:     Head: Normocephalic and atraumatic.  Eyes:     Conjunctiva/sclera: Conjunctivae normal.  Cardiovascular:     Rate and Rhythm: Normal rate and regular rhythm.     Heart sounds: Normal heart sounds.  Pulmonary:     Effort: Pulmonary effort is normal.     Breath sounds: Normal breath sounds.  Neurological:     General: No focal deficit present.     Mental Status: She is alert.  Psychiatric:        Behavior: Behavior normal.    Assessment & Plan:   Bilateral lower extremity edema At goal. Recommendations: decrease sodium in the diet, elevate feet above the level of the heart whenever possible, increase physical activity, and use of  compression stockings. Medication: Lasix .  Binge eating disorder The goals for treatment of BED are to reduce eating binges and to achieve healthy eating habits. Because binge eating can correlate with negative emotions, treatment may also address any other mental-health issues, such as depression. People who binge eat feel as if they don t have control over how much they eat and have feelings of guilt or self-loathing after a binge eating episode. The FDA has approved Vyvanse  as a treatment option for binge eating disorder. Vyvanse  targets the brain.s reward center by increasing the levels of dopamine and norepinephrine, the chemicals of the brain responsible for feelings of pleasure. Mindful eating is the recommended nutritional approach to treating BED.   History of obesity Starting weight: 372 lbs Starting date: 07/04/20 Today's weight: 195 lbs Today's date: 11/09/2024 Total lbs lost to date: 177 lbs Total lbs lost since last in-office visit: 6 lbs Total weight loss percentage to date: -47.58% Body mass index is 28.86 kg/m.   Overweight with body mass index (BMI) of 28 to 28.9 in adult Well controlled.  No signs of complications, medication side effects, or red flags.  Continue current regimen.    Diabetes mellitus (HCC) Diabetes Mellitus: At goal. Medication: Mounjaro . Issues reviewed: blood sugar goals, complications of diabetes mellitus, hypoglycemia prevention and treatment, exercise, and nutrition.  Plan: The patient will continue to focus on protein-rich, low simple carbohydrate foods. We reviewed the importance of hydration, regular exercise for stress  reduction, and restorative sleep.   Lab Results  Component Value Date   HGBA1C 4.9 04/21/2024   HGBA1C 5.3 10/02/2020   HGBA1C 5.5 12/23/2018   Geni Shutter, DO, MS, FAAFP, Dipl. [facility] [address] [place], [address] Dept: [phone] Dept Fax:  [phone]  Attestations:   Reviewed by clinician on day of visit: allergies, medications, problem list, medical history, surgical history, family history, social history, and previous encounter notes. As the patient's PCP, board-certified in [facility] Medicine and Obesity Medicine, I am providing ongoing, guideline-directed obesity management utilizing all pillars of care, including lifestyle intervention and FDA-approved anti-obesity pharmacotherapy.    [1]  Outpatient Medications Prior to Visit  Medication Sig   buPROPion  (WELLBUTRIN  SR) 150 MG 12 hr tablet Take 1 tablet (150 mg total) by mouth daily.   Cyanocobalamin  (VITAMIN B-12) 1000 MCG SUBL Place 1 tablet (1,000 mcg total) under the tongue daily.   estradiol  (VIVELLE -DOT) 0.025 MG/24HR Place 1 patch onto the skin 2 (two) times a week.   furosemide  (LASIX ) 20 MG tablet Take 1 tablet (20 mg total) by mouth daily.   spironolactone  (ALDACTONE ) 50 MG tablet Take 1 tablet (50 mg total) by mouth daily.   Vitamin D , Ergocalciferol , (DRISDOL ) 1.25 MG (50000 UNIT) CAPS capsule Take 1 capsule (50,000 Units total) by mouth every 7 (seven) days.   zolpidem  (AMBIEN  CR) 12.5 MG CR tablet Take 1 tablet (12.5 mg total) by mouth at bedtime as needed for sleep.   [DISCONTINUED] lisdexamfetamine  (VYVANSE ) 60 MG capsule Take 1 capsule (60 mg total) by mouth in the morning.   [DISCONTINUED] tirzepatide  (MOUNJARO ) 10 MG/0.5ML Pen Inject 10 mg into the skin once a week.   No facility-administered medications prior to visit.  [2]  Allergies Allergen Reactions   Amlodipine  Other (See Comments)    Leg swelling 5 mg dose   Trazodone And Nefazodone     Felt weird     "

## 2024-11-09 NOTE — Assessment & Plan Note (Signed)
 Diabetes Mellitus: At goal. Medication: Mounjaro . Issues reviewed: blood sugar goals, complications of diabetes mellitus, hypoglycemia prevention and treatment, exercise, and nutrition.  Plan: The patient will continue to focus on protein-rich, low simple carbohydrate foods. We reviewed the importance of hydration, regular exercise for stress reduction, and restorative sleep.   Lab Results  Component Value Date   HGBA1C 4.9 04/21/2024   HGBA1C 5.3 10/02/2020   HGBA1C 5.5 12/23/2018

## 2024-11-09 NOTE — Assessment & Plan Note (Signed)
 The goals for treatment of BED are to reduce eating binges and to achieve healthy eating habits. Because binge eating can correlate with negative emotions, treatment may also address any other mental-health issues, such as depression. People who binge eat feel as if they don t have control over how much they eat and have feelings of guilt or self-loathing after a binge eating episode. The FDA has approved Vyvanse  as a treatment option for binge eating disorder. Vyvanse  targets the brain.s reward center by increasing the levels of dopamine and norepinephrine, the chemicals of the brain responsible for feelings of pleasure. Mindful eating is the recommended nutritional approach to treating BED.

## 2024-11-09 NOTE — Assessment & Plan Note (Signed)
 Starting weight: 372 lbs Starting date: 07/04/20 Today's weight: 195 lbs Today's date: 11/09/2024 Total lbs lost to date: 177 lbs Total lbs lost since last in-office visit: 6 lbs Total weight loss percentage to date: -47.58% Body mass index is 28.86 kg/m.

## 2024-11-09 NOTE — Assessment & Plan Note (Signed)
Well controlled.  No signs of complications, medication side effects, or red flags.  Continue current regimen.   

## 2024-11-09 NOTE — Assessment & Plan Note (Signed)
 At goal. Recommendations: decrease sodium in the diet, elevate feet above the level of the heart whenever possible, increase physical activity, and use of compression stockings. Medication: Lasix .

## 2024-11-22 ENCOUNTER — Telehealth: Payer: Self-pay

## 2024-11-22 NOTE — Telephone Encounter (Signed)
 Spoke with pt briefly to let her know we need to reschedule her TOC appt. She states she will call back, currently in a meeting.  E2C2, when this pt calls back, please transfer them to the front office for rescheduling -kh

## 2024-12-01 ENCOUNTER — Ambulatory Visit: Admitting: Family Medicine

## 2024-12-01 ENCOUNTER — Encounter: Payer: Self-pay | Admitting: Family Medicine

## 2024-12-01 VITALS — BP 112/68 | HR 68 | Ht 70.0 in | Wt 189.6 lb

## 2024-12-01 DIAGNOSIS — F50811 Binge eating disorder, moderate: Secondary | ICD-10-CM | POA: Diagnosis not present

## 2024-12-01 DIAGNOSIS — Z9884 Bariatric surgery status: Secondary | ICD-10-CM

## 2024-12-01 DIAGNOSIS — Z8639 Personal history of other endocrine, nutritional and metabolic disease: Secondary | ICD-10-CM

## 2024-12-01 DIAGNOSIS — E1169 Type 2 diabetes mellitus with other specified complication: Secondary | ICD-10-CM | POA: Diagnosis not present

## 2024-12-01 DIAGNOSIS — Z79899 Other long term (current) drug therapy: Secondary | ICD-10-CM

## 2024-12-01 LAB — CBC WITH DIFFERENTIAL/PLATELET
Basophils Absolute: 0 K/uL (ref 0.0–0.1)
Basophils Relative: 0.8 % (ref 0.0–3.0)
Eosinophils Absolute: 0 K/uL (ref 0.0–0.7)
Eosinophils Relative: 0.4 % (ref 0.0–5.0)
HCT: 42.1 % (ref 36.0–46.0)
Hemoglobin: 13.8 g/dL (ref 12.0–15.0)
Lymphocytes Relative: 43.5 % (ref 12.0–46.0)
Lymphs Abs: 1.5 K/uL (ref 0.7–4.0)
MCHC: 32.7 g/dL (ref 30.0–36.0)
MCV: 88.5 fl (ref 78.0–100.0)
Monocytes Absolute: 0.2 K/uL (ref 0.1–1.0)
Monocytes Relative: 4.8 % (ref 3.0–12.0)
Neutro Abs: 1.7 K/uL (ref 1.4–7.7)
Neutrophils Relative %: 50.5 % (ref 43.0–77.0)
Platelets: 249 K/uL (ref 150.0–400.0)
RBC: 4.76 Mil/uL (ref 3.87–5.11)
RDW: 13.5 % (ref 11.5–15.5)
WBC: 3.4 K/uL — ABNORMAL LOW (ref 4.0–10.5)

## 2024-12-01 LAB — LIPID PANEL
Cholesterol: 195 mg/dL (ref 28–200)
HDL: 102.3 mg/dL
LDL Cholesterol: 81 mg/dL (ref 10–99)
NonHDL: 92.78
Total CHOL/HDL Ratio: 2
Triglycerides: 58 mg/dL (ref 10.0–149.0)
VLDL: 11.6 mg/dL (ref 0.0–40.0)

## 2024-12-01 LAB — COMPREHENSIVE METABOLIC PANEL WITH GFR
ALT: 33 U/L (ref 3–35)
AST: 32 U/L (ref 5–37)
Albumin: 4.3 g/dL (ref 3.5–5.2)
Alkaline Phosphatase: 86 U/L (ref 39–117)
BUN: 10 mg/dL (ref 6–23)
CO2: 27 meq/L (ref 19–32)
Calcium: 8.8 mg/dL (ref 8.4–10.5)
Chloride: 103 meq/L (ref 96–112)
Creatinine, Ser: 0.82 mg/dL (ref 0.40–1.20)
GFR: 85.55 mL/min
Glucose, Bld: 62 mg/dL — ABNORMAL LOW (ref 70–99)
Potassium: 3.7 meq/L (ref 3.5–5.1)
Sodium: 138 meq/L (ref 135–145)
Total Bilirubin: 0.8 mg/dL (ref 0.2–1.2)
Total Protein: 7.2 g/dL (ref 6.0–8.3)

## 2024-12-01 LAB — MICROALBUMIN / CREATININE URINE RATIO
Creatinine,U: 92.2 mg/dL
Microalb Creat Ratio: UNDETERMINED mg/g (ref 0.0–30.0)
Microalb, Ur: <0.7 mg/dL

## 2024-12-01 LAB — TSH: TSH: 1 u[IU]/mL (ref 0.35–5.50)

## 2024-12-01 LAB — VITAMIN B12: Vitamin B-12: 177 pg/mL — ABNORMAL LOW (ref 211–911)

## 2024-12-01 LAB — VITAMIN D 25 HYDROXY (VIT D DEFICIENCY, FRACTURES): VITD: 25.35 ng/mL — ABNORMAL LOW (ref 30.00–100.00)

## 2024-12-01 LAB — HEMOGLOBIN A1C: Hgb A1c MFr Bld: 5.2 % (ref 4.6–6.5)

## 2024-12-01 MED ORDER — LISDEXAMFETAMINE DIMESYLATE 60 MG PO CAPS: 60.0000 mg | ORAL_CAPSULE | Freq: Every morning | ORAL | 0 refills | Status: AC

## 2024-12-01 NOTE — Progress Notes (Signed)
 "    Patient Care Team: Prentiss Frieze, DO as PCP - General (Family Medicine)  Weight Management:   Starting weight: 372 lbs Starting date: 07/04/20 Today's weight: 189 lbs Today's date: 11/09/2024 Total lbs lost to date: 183 lbs Total lbs lost since last in-office visit: 6 lbs Total weight loss percentage to date: -49.19% Body mass index is 28.86 kg/m.   Nutrition Plan: Mindful eating, with focus on adequate protein intake.. Anti-obesity medications (including off-label): Mounjaro , Vyvanse . Reported side effects: none. Hunger is well controlled. Cravings are moderately controlled. Activity: Some walking.  Sleep: Number of hours slept each night: 8. Sleep is restful.   Assessment & Plan:   Alyssa Cain was seen today for medical management of chronic issues. Overall, she is doing well. She is happy with continued weight loss. She is drinking plenty of water, but not getting enough protein. She used to drink protein drinks but developed severe constipation. She is now controlling constipation with fiber gummies, so may try the protein drink again. She cannot tell if the estrogen patch is helping. With Hx of DVT, we will trial off patch instead of increasing to see if she notices a difference. Previous vasomotor symptoms have improved since being back on other medications. She does not a right sided tension headache, mild. Wants to keep an eye on it since her mother passed from glioblastoma. Labs ordered today. Medications refilled. Stable all around, so will continue treatments.  Type 2 diabetes mellitus with other specified complication, without long-term current use of insulin  (HCC) -     Microalbumin / creatinine urine ratio -     Comprehensive metabolic panel with GFR -     Hemoglobin [name] -     Lipid panel  Moderate binge-eating disorder -     lisdexamfetamine  (VYVANSE ) 60 MG capsule; Take 1 capsule (60 mg total) by mouth in the morning.  Medication management  History of  obesity  H/O gastric bypass -     CBC with Differential/Platelet -     Iron, TIBC and Ferritin Panel -     TSH -     Vitamin B12 -     VITAMIN D  25 Hydroxy (Vit-D Deficiency, Fractures)   Frieze Prentiss, DO, MS, FAAFP, Dipl. [facility] at [facility] [address] [place], [address] Dept: [phone] Dept Fax: [phone]  Subjective:   Review of Systems: Negative, with the exception of above mentioned in HPI.  History:   Reviewed by clinician on day of visit: allergies, medications, problem list, medical history, surgical history, family history, social history, and previous encounter notes.  Medications:   Show/hide medication list[1] Allergies[2]  Objective:   BP 112/68 (BP Location: Right Arm, Cuff Size: Large)   Pulse 68   Ht 5' 10 (1.778 m)   Wt 189 lb 9.6 oz (86 kg)   LMP  (LMP Unknown)   BMI 27.20 kg/m   Physical Exam Constitutional:      General: She is not in acute distress.    Appearance: She is well-developed.  HENT:     Head: Normocephalic and atraumatic.  Eyes:     Conjunctiva/sclera: Conjunctivae normal.  Cardiovascular:     Rate and Rhythm: Normal rate and regular rhythm.     Heart sounds: Normal heart sounds.  Pulmonary:     Effort: Pulmonary effort is normal.     Breath sounds: Normal breath sounds.  Neurological:     General: No focal deficit present.  Mental Status: She is alert.  Psychiatric:        Behavior: Behavior normal.    Attestations:   Reviewed by clinician on day of visit: allergies, medications, problem list, medical history, surgical history, family history, social history, and previous encounter notes. As the patient's primary care physician, board-certified in Family Medicine and Obesity Medicine, I am providing ongoing, comprehensive obesity care based on the pillars of obesity medicine, including nutrition therapy, physical activity, behavioral modification, and pharmacologic  treatment.  Geni Shutter, DO, MS, FAAFP, Dipl. [facility] at [facility] [address] [place], [address] Dept: [phone] Dept Fax: [phone]      [1]  Outpatient Medications Prior to Visit  Medication Sig   buPROPion  (WELLBUTRIN  SR) 150 MG 12 hr tablet Take 1 tablet (150 mg total) by mouth daily.   Cyanocobalamin  (VITAMIN B-12) 1000 MCG SUBL Place 1 tablet (1,000 mcg total) under the tongue daily.   estradiol  (VIVELLE -DOT) 0.025 MG/24HR Place 1 patch onto the skin 2 (two) times a week.   furosemide  (LASIX ) 20 MG tablet Take 1 tablet (20 mg total) by mouth daily.   spironolactone  (ALDACTONE ) 50 MG tablet Take 1 tablet (50 mg total) by mouth daily.   tirzepatide  (MOUNJARO ) 10 MG/0.5ML Pen Inject 10 mg into the skin once a week.   Vitamin D , Ergocalciferol , (DRISDOL ) 1.25 MG (50000 UNIT) CAPS capsule Take 1 capsule (50,000 Units total) by mouth every 7 (seven) days.   zolpidem  (AMBIEN  CR) 12.5 MG CR tablet Take 1 tablet (12.5 mg total) by mouth at bedtime as needed for sleep.   [DISCONTINUED] lisdexamfetamine  (VYVANSE ) 60 MG capsule Take 1 capsule (60 mg total) by mouth in the morning.   No facility-administered medications prior to visit.  [2]  Allergies Allergen Reactions   Amlodipine  Other (See Comments)    Leg swelling 5 mg dose   Trazodone And Nefazodone     Felt weird     "

## 2024-12-02 LAB — IRON,TIBC AND FERRITIN PANEL
%SAT: 34 % (ref 16–45)
Ferritin: 478 ng/mL — ABNORMAL HIGH (ref 16–232)
Iron: 107 ug/dL (ref 40–190)
TIBC: 311 ug/dL (ref 250–450)

## 2024-12-04 ENCOUNTER — Ambulatory Visit: Payer: Self-pay | Admitting: Family Medicine

## 2025-01-01 ENCOUNTER — Encounter

## 2025-01-01 ENCOUNTER — Ambulatory Visit: Admitting: Family Medicine

## 2025-01-02 ENCOUNTER — Ambulatory Visit: Admitting: Family Medicine

## 2025-01-29 ENCOUNTER — Ambulatory Visit: Admitting: Family Medicine
